# Patient Record
Sex: Female | Born: 1940 | Race: White | Hispanic: No | Marital: Single | Smoking: Former smoker
Health system: Southern US, Community
[De-identification: ages and names within clinical notes are randomized; demographics above are authoritative.]

## PROBLEM LIST (undated history)

## (undated) DIAGNOSIS — I10 Essential (primary) hypertension: Secondary | ICD-10-CM

## (undated) DIAGNOSIS — I714 Abdominal aortic aneurysm, without rupture, unspecified: Secondary | ICD-10-CM

## (undated) DIAGNOSIS — J449 Chronic obstructive pulmonary disease, unspecified: Secondary | ICD-10-CM

## (undated) DIAGNOSIS — M545 Low back pain, unspecified: Secondary | ICD-10-CM

## (undated) DIAGNOSIS — I71012 Dissection of descending thoracic aorta: Secondary | ICD-10-CM

## (undated) DIAGNOSIS — I739 Peripheral vascular disease, unspecified: Secondary | ICD-10-CM

## (undated) DIAGNOSIS — I712 Thoracic aortic aneurysm, without rupture, unspecified: Secondary | ICD-10-CM

## (undated) DIAGNOSIS — I5042 Chronic combined systolic (congestive) and diastolic (congestive) heart failure: Secondary | ICD-10-CM

## (undated) DIAGNOSIS — K219 Gastro-esophageal reflux disease without esophagitis: Secondary | ICD-10-CM

## (undated) DIAGNOSIS — R531 Weakness: Secondary | ICD-10-CM

## (undated) DIAGNOSIS — J9621 Acute and chronic respiratory failure with hypoxia: Secondary | ICD-10-CM

## (undated) DIAGNOSIS — G4700 Insomnia, unspecified: Secondary | ICD-10-CM

## (undated) DIAGNOSIS — E46 Unspecified protein-calorie malnutrition: Secondary | ICD-10-CM

## (undated) DIAGNOSIS — K5900 Constipation, unspecified: Secondary | ICD-10-CM

## (undated) DIAGNOSIS — D649 Anemia, unspecified: Secondary | ICD-10-CM

## (undated) DIAGNOSIS — J9690 Respiratory failure, unspecified, unspecified whether with hypoxia or hypercapnia: Secondary | ICD-10-CM

## (undated) DIAGNOSIS — F419 Anxiety disorder, unspecified: Secondary | ICD-10-CM

## (undated) DIAGNOSIS — G2581 Restless legs syndrome: Secondary | ICD-10-CM

## (undated) DIAGNOSIS — E785 Hyperlipidemia, unspecified: Secondary | ICD-10-CM

## (undated) DIAGNOSIS — F329 Major depressive disorder, single episode, unspecified: Secondary | ICD-10-CM

## (undated) DIAGNOSIS — I7100 Dissection of unspecified site of aorta: Secondary | ICD-10-CM

## (undated) HISTORY — DX: Hyperlipidemia, unspecified: E78.5

## (undated) HISTORY — DX: Chronic obstructive pulmonary disease, unspecified: J44.9

## (undated) HISTORY — DX: Low back pain: M54.5

## (undated) HISTORY — DX: Thoracic aortic aneurysm, without rupture, unspecified: I71.20

## (undated) HISTORY — DX: Restless legs syndrome: G25.81

## (undated) HISTORY — DX: Thoracic aortic aneurysm, without rupture: I71.2

## (undated) HISTORY — DX: Constipation, unspecified: K59.00

## (undated) HISTORY — DX: Insomnia, unspecified: G47.00

## (undated) HISTORY — DX: Unspecified protein-calorie malnutrition: E46

## (undated) HISTORY — DX: Essential (primary) hypertension: I10

## (undated) HISTORY — DX: Peripheral vascular disease, unspecified: I73.9

## (undated) HISTORY — DX: Acute and chronic respiratory failure with hypoxia: J96.21

## (undated) HISTORY — DX: Anxiety disorder, unspecified: F41.9

## (undated) HISTORY — DX: Respiratory failure, unspecified, unspecified whether with hypoxia or hypercapnia: J96.90

## (undated) HISTORY — DX: Gastro-esophageal reflux disease without esophagitis: K21.9

## (undated) HISTORY — DX: Major depressive disorder, single episode, unspecified: F32.9

## (undated) HISTORY — DX: Chronic combined systolic (congestive) and diastolic (congestive) heart failure: I50.42

## (undated) HISTORY — DX: Low back pain, unspecified: M54.50

## (undated) HISTORY — DX: Anemia, unspecified: D64.9

## (undated) HISTORY — PX: HERNIA REPAIR: SHX51

## (undated) HISTORY — DX: Weakness: R53.1

---

## 2016-08-30 HISTORY — PX: VIDEO ASSISTED THORACOSCOPY (VATS)/THOROCOTOMY: SHX6173

## 2016-08-30 HISTORY — PX: TRACHEOSTOMY: SUR1362

## 2016-09-06 HISTORY — PX: THORACIC AORTIC ANEURYSM REPAIR: SHX799

## 2016-10-06 ENCOUNTER — Other Ambulatory Visit (HOSPITAL_COMMUNITY): Payer: Medicare HMO

## 2016-10-06 ENCOUNTER — Institutional Professional Consult (permissible substitution) (HOSPITAL_COMMUNITY): Payer: Medicare HMO

## 2016-10-06 ENCOUNTER — Inpatient Hospital Stay
Admission: RE | Admit: 2016-10-06 | Discharge: 2016-11-04 | Disposition: A | Discharge status: Skilled Nursing Facility | Payer: Medicare HMO | Source: Ambulatory Visit | Attending: Internal Medicine | Admitting: Internal Medicine

## 2016-10-06 DIAGNOSIS — J9610 Chronic respiratory failure, unspecified whether with hypoxia or hypercapnia: Secondary | ICD-10-CM

## 2016-10-06 DIAGNOSIS — Z931 Gastrostomy status: Secondary | ICD-10-CM

## 2016-10-06 DIAGNOSIS — R52 Pain, unspecified: Secondary | ICD-10-CM

## 2016-10-06 DIAGNOSIS — J9690 Respiratory failure, unspecified, unspecified whether with hypoxia or hypercapnia: Secondary | ICD-10-CM

## 2016-10-06 MED ORDER — IOPAMIDOL (ISOVUE-300) INJECTION 61%
INTRAVENOUS | Status: AC
  Administered 2016-10-06: 50 mL
  Filled 2016-10-06: qty 50

## 2016-10-06 MED ORDER — IOPAMIDOL (ISOVUE-300) INJECTION 61%
50.0000 mL | Freq: Once | INTRAVENOUS | Status: AC | PRN
  Administered 2016-10-06: 50 mL

## 2016-10-07 LAB — CBC WITH DIFFERENTIAL/PLATELET
Basophils Absolute: 0 10*3/uL (ref 0.0–0.1)
Basophils Relative: 0 %
EOS ABS: 0.2 10*3/uL (ref 0.0–0.7)
EOS PCT: 3 %
HCT: 26.2 % — ABNORMAL LOW (ref 36.0–46.0)
Hemoglobin: 7.9 g/dL — ABNORMAL LOW (ref 12.0–15.0)
LYMPHS ABS: 0.9 10*3/uL (ref 0.7–4.0)
Lymphocytes Relative: 17 %
MCH: 30.5 pg (ref 26.0–34.0)
MCHC: 30.2 g/dL (ref 30.0–36.0)
MCV: 101.2 fL — ABNORMAL HIGH (ref 78.0–100.0)
MONO ABS: 0.5 10*3/uL (ref 0.1–1.0)
Monocytes Relative: 10 %
Neutro Abs: 3.8 10*3/uL (ref 1.7–7.7)
Neutrophils Relative %: 70 %
PLATELETS: 156 10*3/uL (ref 150–400)
RBC: 2.59 MIL/uL — AB (ref 3.87–5.11)
RDW: 19.5 % — AB (ref 11.5–15.5)
WBC: 5.5 10*3/uL (ref 4.0–10.5)

## 2016-10-07 LAB — BLOOD GAS, ARTERIAL
ACID-BASE EXCESS: 8.6 mmol/L — AB (ref 0.0–2.0)
BICARBONATE: 33.8 mmol/L — AB (ref 20.0–28.0)
FIO2: 45
LHR: 18 {breaths}/min
MECHVT: 400 mL
O2 Saturation: 98.5 %
PCO2 ART: 58.4 mmHg — AB (ref 32.0–48.0)
PEEP: 5 cmH2O
PH ART: 7.379 (ref 7.350–7.450)
Patient temperature: 98.1
pO2, Arterial: 106 mmHg (ref 83.0–108.0)

## 2016-10-07 LAB — COMPREHENSIVE METABOLIC PANEL
ALT: 11 U/L — AB (ref 14–54)
ANION GAP: 6 (ref 5–15)
AST: 17 U/L (ref 15–41)
Albumin: 2.3 g/dL — ABNORMAL LOW (ref 3.5–5.0)
Alkaline Phosphatase: 61 U/L (ref 38–126)
BUN: 8 mg/dL (ref 6–20)
CHLORIDE: 101 mmol/L (ref 101–111)
CO2: 32 mmol/L (ref 22–32)
CREATININE: 0.47 mg/dL (ref 0.44–1.00)
Calcium: 8.8 mg/dL — ABNORMAL LOW (ref 8.9–10.3)
Glucose, Bld: 88 mg/dL (ref 65–99)
POTASSIUM: 3.6 mmol/L (ref 3.5–5.1)
SODIUM: 139 mmol/L (ref 135–145)
Total Bilirubin: 0.4 mg/dL (ref 0.3–1.2)
Total Protein: 5.4 g/dL — ABNORMAL LOW (ref 6.5–8.1)

## 2016-10-07 LAB — PROTIME-INR
INR: 1.16
PROTHROMBIN TIME: 14.9 s (ref 11.4–15.2)

## 2016-10-08 LAB — URINALYSIS, ROUTINE W REFLEX MICROSCOPIC
BILIRUBIN URINE: NEGATIVE
Glucose, UA: NEGATIVE mg/dL
Ketones, ur: NEGATIVE mg/dL
Nitrite: NEGATIVE
PH: 7 (ref 5.0–8.0)
Protein, ur: NEGATIVE mg/dL
SPECIFIC GRAVITY, URINE: 1.009 (ref 1.005–1.030)

## 2016-10-10 LAB — URINE CULTURE

## 2016-10-13 LAB — CBC
HCT: 28.4 % — ABNORMAL LOW (ref 36.0–46.0)
HEMOGLOBIN: 9 g/dL — AB (ref 12.0–15.0)
MCH: 31 pg (ref 26.0–34.0)
MCHC: 31.7 g/dL (ref 30.0–36.0)
MCV: 97.9 fL (ref 78.0–100.0)
Platelets: 180 10*3/uL (ref 150–400)
RBC: 2.9 MIL/uL — ABNORMAL LOW (ref 3.87–5.11)
RDW: 18.7 % — ABNORMAL HIGH (ref 11.5–15.5)
WBC: 6.4 10*3/uL (ref 4.0–10.5)

## 2016-10-13 LAB — BASIC METABOLIC PANEL
Anion gap: 7 (ref 5–15)
BUN: 8 mg/dL (ref 6–20)
CHLORIDE: 97 mmol/L — AB (ref 101–111)
CO2: 33 mmol/L — ABNORMAL HIGH (ref 22–32)
CREATININE: 0.37 mg/dL — AB (ref 0.44–1.00)
Calcium: 9 mg/dL (ref 8.9–10.3)
GFR calc Af Amer: >60 mL/min (ref >60)
GFR calc non Af Amer: >60 mL/min (ref >60)
GLUCOSE: 101 mg/dL — AB (ref 65–99)
Potassium: 3.7 mmol/L (ref 3.5–5.1)
SODIUM: 137 mmol/L (ref 135–145)

## 2016-10-16 ENCOUNTER — Other Ambulatory Visit (HOSPITAL_COMMUNITY): Payer: Medicare HMO

## 2016-10-19 ENCOUNTER — Other Ambulatory Visit (HOSPITAL_COMMUNITY): Payer: Medicare HMO

## 2016-10-19 LAB — BASIC METABOLIC PANEL
Anion gap: 8 (ref 5–15)
BUN: 12 mg/dL (ref 6–20)
CALCIUM: 9.3 mg/dL (ref 8.9–10.3)
CHLORIDE: 100 mmol/L — AB (ref 101–111)
CO2: 30 mmol/L (ref 22–32)
CREATININE: 0.52 mg/dL (ref 0.44–1.00)
GFR calc Af Amer: >60 mL/min (ref >60)
GFR calc non Af Amer: >60 mL/min (ref >60)
Glucose, Bld: 85 mg/dL (ref 65–99)
Potassium: 4 mmol/L (ref 3.5–5.1)
SODIUM: 138 mmol/L (ref 135–145)

## 2016-10-19 LAB — CBC
HEMATOCRIT: 30.8 % — AB (ref 36.0–46.0)
HEMOGLOBIN: 9.3 g/dL — AB (ref 12.0–15.0)
MCH: 29.9 pg (ref 26.0–34.0)
MCHC: 30.2 g/dL (ref 30.0–36.0)
MCV: 99 fL (ref 78.0–100.0)
Platelets: 145 10*3/uL — ABNORMAL LOW (ref 150–400)
RBC: 3.11 MIL/uL — ABNORMAL LOW (ref 3.87–5.11)
RDW: 18 % — AB (ref 11.5–15.5)
WBC: 5.6 10*3/uL (ref 4.0–10.5)

## 2016-10-26 LAB — CBC
HCT: 29.3 % — ABNORMAL LOW (ref 36.0–46.0)
Hemoglobin: 8.9 g/dL — ABNORMAL LOW (ref 12.0–15.0)
MCH: 29.8 pg (ref 26.0–34.0)
MCHC: 30.4 g/dL (ref 30.0–36.0)
MCV: 98 fL (ref 78.0–100.0)
Platelets: 125 K/uL — ABNORMAL LOW (ref 150–400)
RBC: 2.99 MIL/uL — ABNORMAL LOW (ref 3.87–5.11)
RDW: 16.2 % — ABNORMAL HIGH (ref 11.5–15.5)
WBC: 4.7 K/uL (ref 4.0–10.5)

## 2016-10-26 LAB — BASIC METABOLIC PANEL
Anion gap: 8 (ref 5–15)
BUN: 7 mg/dL (ref 6–20)
CALCIUM: 9.7 mg/dL (ref 8.9–10.3)
CHLORIDE: 94 mmol/L — AB (ref 101–111)
CO2: 36 mmol/L — AB (ref 22–32)
Creatinine, Ser: 0.43 mg/dL — ABNORMAL LOW (ref 0.44–1.00)
GFR calc non Af Amer: >60 mL/min (ref >60)
GLUCOSE: 95 mg/dL (ref 65–99)
Potassium: 3.9 mmol/L (ref 3.5–5.1)
Sodium: 138 mmol/L (ref 135–145)

## 2016-11-01 ENCOUNTER — Other Ambulatory Visit (HOSPITAL_COMMUNITY): Payer: Medicare HMO

## 2016-11-13 ENCOUNTER — Ambulatory Visit: Payer: Medicare HMO | Admitting: Cardiology

## 2016-12-03 ENCOUNTER — Encounter: Payer: Self-pay | Admitting: Internal Medicine

## 2016-12-03 ENCOUNTER — Ambulatory Visit: Payer: Medicare HMO | Admitting: Internal Medicine

## 2016-12-03 ENCOUNTER — Ambulatory Visit (INDEPENDENT_AMBULATORY_CARE_PROVIDER_SITE_OTHER): Payer: Medicare HMO | Admitting: Internal Medicine

## 2016-12-03 VITALS — BP 142/68 | HR 88 | Ht 67.0 in | Wt 162.6 lb

## 2016-12-03 DIAGNOSIS — I509 Heart failure, unspecified: Secondary | ICD-10-CM | POA: Diagnosis not present

## 2016-12-03 DIAGNOSIS — I10 Essential (primary) hypertension: Secondary | ICD-10-CM | POA: Diagnosis not present

## 2016-12-03 DIAGNOSIS — I711 Thoracic aortic aneurysm, ruptured, unspecified: Secondary | ICD-10-CM

## 2016-12-03 NOTE — Patient Instructions (Addendum)
Medication Instructions:  Your physician recommends that you continue on your current medications as directed. Please refer to the Current Medication list given to you today.   Labwork: NONE ORDRED  Testing/Procedures: NONE ORDERED  Follow-Up: 01/15/17 @ 2:40 WITH DR. END  Any Other Special Instructions Will Be Listed Below (If Applicable).     If you need a refill on your cardiac medications before your next appointment, please call your pharmacy.

## 2016-12-03 NOTE — Progress Notes (Signed)
New Outpatient Visit Date: 12/03/2016  [facility]  Chief Complaint: Hospital follow-up  HPI:  [name] is a 76 y.o. female who is being seen today for the evaluation of possible CHF. The patient is unsure of why she is here today. She had a lengthy hospitalization in [place] that began with a ruptured thoracic aortic aneurysm in early April while she was visiting family. The patient has minimal recollection of her her entire hospitalization. Sounds like she presented to [facility] with lower abdominal pain and was found to have a ruptured TAA. He was transferred to [facility] [place] [facility] [place], where she underwent endovascular repair complicated by hemothorax with chest tube placement and subsequent VATS. She required multiple re-intubations and ultimately underwent tracheostomy and G-tube placement. Due to prolonged ventilator wean, [facility] [place] which [place] in [place]. [facility]. There is mention in [facility] of CHF, though specific not provided.  [name] denies any significant past medical history leading up to her hospitalization. She specifically denies any history of heart disease and has never seen a cardiologist before. She had not undergone previous cardiac testing to her knowledge. Medical records from her lengthy hospitalization in [place] are not available. I was able to speak with her daughter-in-law, Briana OharaBarbara Dean, who was able to provide further information on where [name] was hospitalized, though she to had limited information about the hospital course.  --------------------------------------------------------------------------------------------------  Cardiovascular History & Procedures: Cardiovascular  Problems:  TAA  ?CHF  Risk Factors:  Peripheral vascular disease, tobacco use, family history, and age greater than 4065  Cath/PCI:  None available  CV Surgery:  Endovascular TAA repair (08/2016, [place]  EP Procedures and Devices:  None available  Non-Invasive Evaluation(s):  None available  Recent CV Pertinent Labs: Lab Results  Component Value Date   INR 1.16 10/06/2016   K 3.9 10/26/2016   BUN 7 10/26/2016   CREATININE 0.43 (L) 10/26/2016    --------------------------------------------------------------------------------------------------  Past Medical History:  Diagnosis Date  . Acute on chronic respiratory failure with hypoxia (HCC)   . Anemia, unspecified   . Aneurysm of thoracic aorta (HCC)   . Anxiety disorder   . Chronic combined systolic and diastolic heart failure (HCC)   . Constipation   . Gastroesophageal reflux disease without esophagitis   . Hyperlipidemia, unspecified   . Hypertension, essential   . Insomnia, unspecified   . Low back pain   . Major depressive disorder, single episode, unspecified   . PAD (peripheral artery disease) (HCC)   . Respiratory failure (HCC)   . Restless leg syndrome   . Unspecified protein-calorie malnutrition (HCC)   . Weakness     No past surgical history on file.  No outpatient prescriptions have been marked as taking for the 12/03/16 encounter (Appointment) with Etoy Mcdonnell, Cristal Deerhristopher, MD.    Allergies: Aspirin and Penicillins  Social History   Social History  . Marital status: Single    Spouse name: N/A  . Number of children: N/A  . Years of education: N/A   Occupational History  . Not on file.   Social History Main Topics  . Smoking status: Not on file  . Smokeless tobacco: Not on file  . Alcohol use Not on file  . Drug use: Unknown  . Sexual activity: Not  on file   Other Topics Concern  . Not on file   Social History Narrative  . No narrative on file    No family history on  file.  Review of Systems: Patient notes pain with flexion of left hip when trying to get into [place] today. Otherwise, a 12-system review of systems was performed and was negative except as noted in the HPI.  --------------------------------------------------------------------------------------------------  Physical Exam: BP (!) 142/68   Pulse 88   Ht 5\' 7"  (1.702 m)   Wt 162 lb 9.6 oz (73.8 kg)   LMP  (LMP Unknown)   BMI 25.47 kg/m   General:  Frail, elderly woman, seated in wheelchair. She is [facility] HEENT: No conjunctival pallor or scleral icterus. Moist mucous membranes. OP clear. Nasal cannula in place for supplemental oxygen. Neck: Supple without lymphadenopathy, thyromegaly, JVD, or HJR. No carotid bruit. Lungs: Normal work of breathing. Diminished breath sounds throughout, most pronounced at the left base. No wheezes or crackles. Heart: Regular rate and rhythm without murmurs, rubs, or gallops. Non-displaced PMI. Abd: Bowel sounds present. Soft, NT/ND without hepatosplenomegaly Ext: No lower extremity edema. 2+ radial and 1+ pedal pulses bilaterally Skin: Warm and dry without rash. Neuro: Resting tremor present.  Psych: Normal mood and affect.   EKG:  Significant artifact from tremor, limiting evaluation.. NSR with incomplete RBBB and borderline LVH.  Lab Results  Component Value Date   WBC 4.7 10/26/2016   HGB 8.9 (L) 10/26/2016   HCT 29.3 (L) 10/26/2016   MCV 98.0 10/26/2016   PLT 125 (L) 10/26/2016    Lab Results  Component Value Date   NA 138 10/26/2016   K 3.9 10/26/2016   CL 94 (L) 10/26/2016   CO2 36 (H) 10/26/2016   BUN 7 10/26/2016   CREATININE 0.43 (L) 10/26/2016   GLUCOSE 95 10/26/2016   ALT 11 (L) 10/06/2016    No results found for: CHOL, HDL, LDLCALC, LDLDIRECT, TRIG, CHOLHDL   --------------------------------------------------------------------------------------------------  ASSESSMENT AND  PLAN: TAA Detail of hospitalization and intervention are unclear. No symptoms to suggest active complication. We have requested records from hospitals in [place] and [place], [place]. Patient will need long-term vascular surgery follow-up. I will defer making referral until records have been obtained.  CHF Report of heart failure noted in discharge summary from [facility], though no further details are provided (e.g. LVEF, acute vs chronic). She appears euvolemic today, though breath sounds are diminished. I suspect some of this is due to underlying hemothorax, VATS, and prolonged intubation. We will request records and then determine the need for medication adjustments and/or additional testing.  Hypertension BP mildly elevated today but low-normal at nursing home yesterday (122/48). I will defer medication changes today.  Follow-up: Return to clinic in 1 month.  Yvonne Kendall, MD 12/03/2016 3:04 PM

## 2016-12-04 ENCOUNTER — Encounter: Payer: Self-pay | Admitting: Internal Medicine

## 2016-12-04 DIAGNOSIS — I10 Essential (primary) hypertension: Secondary | ICD-10-CM | POA: Insufficient documentation

## 2016-12-04 DIAGNOSIS — I711 Thoracic aortic aneurysm, ruptured, unspecified: Secondary | ICD-10-CM | POA: Insufficient documentation

## 2016-12-04 DIAGNOSIS — I509 Heart failure, unspecified: Secondary | ICD-10-CM | POA: Insufficient documentation

## 2016-12-08 ENCOUNTER — Telehealth: Payer: Self-pay | Admitting: Internal Medicine

## 2016-12-08 NOTE — Telephone Encounter (Signed)
Records rec from [facility]. Placed in chart prep.

## 2016-12-08 NOTE — Telephone Encounter (Signed)
Records received from [facility]. Placed in Chart Prep.

## 2016-12-08 NOTE — Telephone Encounter (Signed)
ROI faxed to [facility]

## 2016-12-08 NOTE — Telephone Encounter (Signed)
ROI faxed to [facility]

## 2016-12-23 ENCOUNTER — Telehealth: Payer: Self-pay | Admitting: Internal Medicine

## 2016-12-23 NOTE — Telephone Encounter (Signed)
New message   rn wants skilled rn to see pt 2x week for 4 weeks   And then 1x week for 4 weeks and then PRN  Medication teaching and disease management teaching

## 2016-12-23 NOTE — Telephone Encounter (Signed)
I think that plan is reasonable. Her PCP should be the one to coordinate her ongoing home health needs. Thanks.  Yvonne Kendallhristopher Zera Markwardt, MD [facility] Pager: [phone]

## 2016-12-23 NOTE — Telephone Encounter (Signed)
Pt was D/C from Nursing home after hospitalization. Vernona RiegerLaura from skills nursing called to verify that Dr End okay for pt to continue with the plan of care at home: Skilled nurse to see pt 2 times a week for 4 weeks, then 1 time a week for 4 weeks and then PRN, medication teaching and disease management teaching. Pt was seen on 7/23 rd per skill nursing for the first time.

## 2016-12-23 NOTE — Telephone Encounter (Signed)
Vernona RiegerLaura skills nurse RN is aware that the skills plan is reasonable. Her PCP should be the one to coordinate her ongoing home health needs. RN verbalized understanding.

## 2016-12-28 ENCOUNTER — Telehealth: Payer: Self-pay | Admitting: Internal Medicine

## 2016-12-28 NOTE — Telephone Encounter (Signed)
I spoke with Briana Dean at [facility], she is aware that Dr End recommended that her PCP coordinate Home Health Care, see phone note 12/23/16.

## 2016-12-28 NOTE — Telephone Encounter (Signed)
Mall[facility] with [facility] calling, states that she needs a verbal order for patient.

## 2017-01-15 ENCOUNTER — Encounter (INDEPENDENT_AMBULATORY_CARE_PROVIDER_SITE_OTHER): Payer: Self-pay

## 2017-01-15 ENCOUNTER — Encounter: Payer: Self-pay | Admitting: Internal Medicine

## 2017-01-15 ENCOUNTER — Ambulatory Visit (INDEPENDENT_AMBULATORY_CARE_PROVIDER_SITE_OTHER): Payer: Medicare HMO | Admitting: Internal Medicine

## 2017-01-15 ENCOUNTER — Other Ambulatory Visit: Payer: Self-pay | Admitting: *Deleted

## 2017-01-15 VITALS — BP 170/90 | HR 70 | Ht 67.0 in | Wt 161.4 lb

## 2017-01-15 DIAGNOSIS — I10 Essential (primary) hypertension: Secondary | ICD-10-CM

## 2017-01-15 DIAGNOSIS — I711 Thoracic aortic aneurysm, ruptured, unspecified: Secondary | ICD-10-CM

## 2017-01-15 DIAGNOSIS — I509 Heart failure, unspecified: Secondary | ICD-10-CM

## 2017-01-15 MED ORDER — LISINOPRIL 10 MG PO TABS: 10.0000 mg | ORAL_TABLET | Freq: Every day | ORAL | 0 refills | Status: DC

## 2017-01-15 NOTE — Progress Notes (Addendum)
Follow-up Outpatient Visit Date: 01/15/2017  Primary Care Provider: Octavio Graves, [place] [id number]  Chief Complaint: Dizziness and elevated blood pressure  HPI:  Ms. Prazak is a 76 y.o. year-old female with history of ruptured thoracic aortic aneurysm status post emergent endovascular repair complicated by hemothorax and prolonged mechanical ventilatoin, who presents for follow-up. I first met her on 12/03/16 for evaluation of "CHF" though Ms. Peregrina did not express any symptoms of heart failure. No further information regarding a diagnosis for CHF was given. We deferred further testing and interventions at that time and requested records from her lengthy hospitalization in [place]. I have reviewed these records, which are notable for the aforementioned TAA repair. Echo report from early May indicates normal LV function with poor visualization of other cardiac structures.  Today, Ms. Markes reports that she has been doing well except for occasional lightheadedness and dizziness. She feels as though she is off balance. This is not clearly orthostatic. Her blood pressure also has been high over the last week, which she feels may be contributing to her dizziness. Her atenolol was increased from 12.5 to 25 mg recently by Dr. Melina Copa. Ms. Straus has left rehabilitation and is living with her daughter now. She continues with physical therapy, occupational therapy, speech therapy. She has not had any chest pain, orthopnea, PND, or edema. She has mild exertional dyspnea when walking from her house to the mailbox and back, which has a slight incline. She also notes occasional brief flutters in her chest without associated symptoms.  --------------------------------------------------------------------------------------------------  Cardiovascular History & Procedures: Cardiovascular Problems:  TAA  ? CHF  Risk Factors:  Peripheral vascular disease, tobacco use, family  history, and age greater than 11  Cath/PCI:  None available  CV Surgery:  Endovascular thoracic aortic aneurysm repair (09/06/2016, [place], [place]): 38 x 38 x 200 proximal Valiant endurant endograft covering the left subclavian artery, extension with a 38 x 38 x 150 Valiant endurant endograft distal component covering the mid/distal descending thoracic aorta at least 5 cm above the celiac trunk.  EP Procedures and Devices:  None available  Non-Invasive Evaluation(s):  TTE (10/03/16, [place], [place]): Normal LV size. LVEF 60-65% with normal wall motion. Valves not well seen. No pericardial effusion.  Recent CV Pertinent Labs: Lab Results  Component Value Date   INR 1.16 10/06/2016   K 3.9 10/26/2016   BUN 7 10/26/2016   CREATININE 0.43 (L) 10/26/2016    Past medical and surgical history were reviewed and updated in EPIC.  Current Meds  Medication Sig  . ALPRAZolam (XANAX) 0.25 MG tablet Take 0.25 mg by mouth every 8 (eight) hours as needed for anxiety.  Marland Kitchen aspirin EC 81 MG tablet Take 81 mg by mouth daily.  Marland Kitchen atenolol (TENORMIN) 12.5 mg TABS tablet Take 12.5 mg by mouth.  Marland Kitchen atorvastatin (LIPITOR) 40 MG tablet Take 40 mg by mouth daily.  Marland Kitchen enoxaparin (LOVENOX) 40 MG/0.4ML injection Inject 40 mg into the skin daily.  . famotidine (PEPCID) 20 MG tablet Take 20 mg by mouth daily.  . ferrous sulfate 324 (65 Fe) MG TBEC Take by mouth daily.  . furosemide (LASIX) 20 MG tablet Take 20 mg by mouth daily.  . Ipratropium-Albuterol (DUONEB IN) Inhale into the lungs 3 (three) times daily.  Marland Kitchen lidocaine (LIDODERM) 5 % Place 1 patch onto the skin daily. Remove & Discard patch within 12 hours or as directed by MD  . lisinopril (PRINIVIL,ZESTRIL) 2.5 MG tablet Take 2.5  mg by mouth daily.  Marland Kitchen MELATONIN ER PO Take 6 mg by mouth at bedtime.  . mirtazapine (REMERON) 7.5 MG tablet Take 7.5 mg by mouth at bedtime.  . Nutritional Supplements (RA MELATONIN/B-6 PO) Take 1 tablet by mouth at bedtime.  .  ondansetron (ZOFRAN) 4 MG tablet Take 4 mg by mouth every 6 (six) hours as needed for nausea or vomiting.  Marland Kitchen PARoxetine (PAXIL) 20 MG tablet Take 20 mg by mouth daily.  . polyethylene glycol (MIRALAX / GLYCOLAX) packet Take 17 g by mouth daily.  . potassium chloride SA (K-DUR,KLOR-CON) 20 MEQ tablet Take 20 mEq by mouth every 4 (four) hours as needed.  Marland Kitchen rOPINIRole (REQUIP) 0.5 MG tablet Take 0.5 mg by mouth at bedtime.     Allergies: Aspirin and Penicillins  Social History   Social History  . Marital status: Single    Spouse name: N/A  . Number of children: N/A  . Years of education: N/A   Occupational History  . Not on file.   Social History Main Topics  . Smoking status: Former Smoker    Packs/day: 1.00    Years: 60.00    Types: Cigarettes    Quit date: 08/30/2016  . Smokeless tobacco: Never Used  . Alcohol use No  . Drug use: No  . Sexual activity: Not on file   Other Topics Concern  . Not on file   Social History Narrative  . No narrative on file    Family History  Problem Relation Age of Onset  . Congenital heart disease Mother   . Peripheral vascular disease Father   . Pectus carinatum Son   . Heart failure Son   . Heart attack Son 55    Review of Systems: A 12-system review of systems was performed and was negative except as noted in the HPI.  --------------------------------------------------------------------------------------------------  Physical Exam: BP (!) 170/90   Pulse 70   Ht _0  (1.702 m)   Wt 161 lb 6.4 oz (73.2 kg)   LMP  (LMP Unknown)   SpO2 95%   BMI 25.28 kg/m   Repeat blood pressure:  Right arm: 186/78  Left arm: 128/56  General:  Well-developed, well-nourished woman, seated comfortably in the exam room. She is accompanied by her daughter. HEENT: No conjunctival pallor or scleral icterus. Moist mucous membranes.  OP clear. Neck: Supple without lymphadenopathy, thyromegaly, JVD, or HJR. Lungs: Normal work of breathing.  Clear to auscultation bilaterally without wheezes or crackles. Heart: Regular rate and rhythm without murmurs, rubs, or gallops. Non-displaced PMI. Abd: Bowel sounds present. Soft, NT/ND without hepatosplenomegaly Ext: No lower extremity edema. 1+ left radial pulse. 2+ right radial and bilateral pedal pulses.  Skin: Warm and dry without rash.   Lab Results  Component Value Date   WBC 4.7 10/26/2016   HGB 8.9 (L) 10/26/2016   HCT 29.3 (L) 10/26/2016   MCV 98.0 10/26/2016   PLT 125 (L) 10/26/2016    Lab Results  Component Value Date   NA 138 10/26/2016   K 3.9 10/26/2016   CL 94 (L) 10/26/2016   CO2 36 (H) 10/26/2016   BUN 7 10/26/2016   CREATININE 0.43 (L) 10/26/2016   GLUCOSE 95 10/26/2016   ALT 11 (L) 10/06/2016    No results found for: CHOL, HDL, LDLCALC, LDLDIRECT, TRIG, CHOLHDL  --------------------------------------------------------------------------------------------------  ASSESSMENT AND PLAN: Shortness of breath and history of heart failure Symptoms are quite minimal and could be related to deconditioning. However, the patient carries a  diagnosis of heart failure. The only echo results that I was able to find are from her hospitalization in [place] in May, at which time her LVEF was normal; there was no mention of diastolic function. She appears euvolemic and on exam today. We have agreed to repeat an echocardiogram to reevaluate for structural abnormalities. If this is normal, I think it is resolved to defer further cardiac workup.  Thoracic aortic aneurysm status post endovascular repair No symptoms at this time. Blood pressure is suboptimally controlled, which we will address, as below. I will refer Ms. Hollings to vascular surgery to establish ongoing follow-up of her endograft.  Hypertension Blood pressure is poorly controlled today. Of note, there is significant discrepancy between the right and left arms. In reviewing the operative note at the time of  endovascular TAA repair, the left subclavian artery was covered by the stent graft. This likely explains the differential blood pressures. I wonder if some of her dizziness may also reflect steal phenomenon. We have agreed to increase lisinopril to 10 mg daily and repeat a basic metabolic panel in about 2 weeks. I will defer additional medication changes to Dr. Melina Copa. Of note, blood pressure readings from the right arm should be used for long-term monitoring and medication titration.  Follow-up: Return to clinic in 3 months in the [place] office, as this is much closer to the patient's home.  Nelva Bush, MD 01/16/2017 5:24 PM

## 2017-01-15 NOTE — Patient Instructions (Signed)
Medication Instructions:  Increase lisinopril to 10 mg daily.  Labwork: Your physician recommends that you have lab in about 2 weeks--I have given you an order for lab that you can take with you. Please go to the Main Entrance of [facility]   Testing/Procedures: Your physician has requested that you have an echocardiogram. Echocardiography is a painless test that uses sound waves to create images of your heart. It provides your doctor with information about the size and shape of your heart and how well your heart's chambers and valves are working. This procedure takes approximately one hour. There are no restrictions for this procedure.  PLEASE SCHEDULE IN [place]--she will take lab order to have lab the same day.   Follow-Up: .You have been referred to Vascular Surgery for follow-up on your thoracic aortic aneurysm repair.  Your physician recommends that you schedule a follow-up appointment in:  3 months with a cardiologist in the [facility] or [facility] office.        If you need a refill on your cardiac medications before your next appointment, please call your pharmacy.

## 2017-01-16 ENCOUNTER — Encounter: Payer: Self-pay | Admitting: Internal Medicine

## 2017-01-29 ENCOUNTER — Ambulatory Visit (HOSPITAL_COMMUNITY)
Admission: RE | Admit: 2017-01-29 | Discharge: 2017-01-29 | Disposition: A | Discharge status: Home / Self Care | Payer: Medicare HMO | Source: Ambulatory Visit | Attending: Internal Medicine | Admitting: Internal Medicine

## 2017-01-29 ENCOUNTER — Telehealth: Payer: Self-pay | Admitting: Internal Medicine

## 2017-01-29 ENCOUNTER — Other Ambulatory Visit (HOSPITAL_COMMUNITY)
Admission: RE | Admit: 2017-01-29 | Discharge: 2017-01-29 | Disposition: A | Discharge status: Home / Self Care | Payer: Medicare HMO | Source: Ambulatory Visit | Attending: Internal Medicine | Admitting: Internal Medicine

## 2017-01-29 DIAGNOSIS — I711 Thoracic aortic aneurysm, ruptured, unspecified: Secondary | ICD-10-CM

## 2017-01-29 DIAGNOSIS — I509 Heart failure, unspecified: Secondary | ICD-10-CM | POA: Insufficient documentation

## 2017-01-29 DIAGNOSIS — I10 Essential (primary) hypertension: Secondary | ICD-10-CM | POA: Diagnosis not present

## 2017-01-29 DIAGNOSIS — I110 Hypertensive heart disease with heart failure: Secondary | ICD-10-CM | POA: Insufficient documentation

## 2017-01-29 LAB — BASIC METABOLIC PANEL
ANION GAP: 5 (ref 5–15)
BUN: 12 mg/dL (ref 6–20)
CALCIUM: 9.4 mg/dL (ref 8.9–10.3)
CO2: 32 mmol/L (ref 22–32)
CREATININE: 0.58 mg/dL (ref 0.44–1.00)
Chloride: 103 mmol/L (ref 101–111)
Glucose, Bld: 100 mg/dL — ABNORMAL HIGH (ref 65–99)
Potassium: 4.2 mmol/L (ref 3.5–5.1)
SODIUM: 140 mmol/L (ref 135–145)

## 2017-01-29 NOTE — Telephone Encounter (Signed)
[facility] echo tech calling to report increased BP w/ dizziness and swimmy headedness. Reports pt has not taken their morning medications nor eaten yet this morning. Advised pt to take their morning medications and get something to eat.  Advised that pt shouldn't drive with dizziness and echo tech informed me that pt's dtr will be driving. Pt will take medication/s and get something to eat and call office if this does not make improvement in symptoms.

## 2017-01-29 NOTE — Progress Notes (Signed)
*  PRELIMINARY RESULTS* Echocardiogram 2D Echocardiogram has been performed.  Briana Dean, Nuno Brubacher 01/29/2017, 11:25 AM

## 2017-01-29 NOTE — Telephone Encounter (Signed)
New Patient     Pt c/o BP issue: STAT if pt c/o blurred vision, one-sided weakness or slurred speech  1. What are your last 5 BP readings?187/94   2. Are you having any other symptoms (ex. Dizziness, headache, blurred vision, passed out)? Swimmy headed and dizzy   3. What is your BP issue?  Has not took meds or eaten, is there something they should do , should they let her drive?

## 2017-02-17 ENCOUNTER — Ambulatory Visit (INDEPENDENT_AMBULATORY_CARE_PROVIDER_SITE_OTHER): Payer: Medicare HMO | Admitting: Vascular Surgery

## 2017-02-17 ENCOUNTER — Encounter: Payer: Self-pay | Admitting: Vascular Surgery

## 2017-02-17 VITALS — BP 131/99 | HR 81 | Temp 97.5°F | Resp 18 | Ht 67.0 in | Wt 162.0 lb

## 2017-02-17 DIAGNOSIS — I716 Thoracoabdominal aortic aneurysm, without rupture, unspecified: Secondary | ICD-10-CM

## 2017-02-17 NOTE — Progress Notes (Signed)
[facility] [place]  Patient name: Briana Dean MRN: [id number] DOB: 1941-04-08 Sex: female  REASON FOR CONSULT: Establish follow-up for treatment of ruptured thoracoabdominal aneurysm  HPI: Briana Dean is a 76 y.o. female, who is seen today to establish follow-up. She is here today with her daughter. She was living in [place] when she had the sudden onset of pain and then does not recall anything else for several months until she was in [facility] long-term acute care. She had a protracted hospital course and [place]. Eventually had recovery and was discharged home. Did have the tracheostomy with ventilator dependency and now is recovered from this as well. She did have stent graft repair with covering of her left subclavian artery and extension down to 5 cm above the celiac artery by history. I do not have her operative records from this event. I do have a chest x-ray in our system from May 2018 showing no evidence of mediastinal hematoma and the stent graft in her thoracic aorta. She has not made full recovery. She is somewhat unsteady on her feet and has fatigue and memory issues but is continuing to make recovery.  Past Medical History:  Diagnosis Date  . Acute on chronic respiratory failure with hypoxia (HCC)   . Anemia, unspecified   . Aneurysm of thoracic aorta (HCC)   . Anxiety disorder   . Chronic combined systolic and diastolic heart failure (HCC)   . Constipation   . COPD (chronic obstructive pulmonary disease) (HCC)   . Gastroesophageal reflux disease without esophagitis   . Hyperlipidemia, unspecified   . Hypertension, essential   . Insomnia, unspecified   . Low back pain   . Major depressive disorder, single episode, unspecified   . PAD (peripheral artery disease) (HCC)   . Respiratory failure (HCC)   . Restless leg syndrome   . Unspecified protein-calorie malnutrition (HCC)   . Weakness      Family History  Problem Relation Age of Onset  . Congenital heart disease Mother   . Heart disease Mother   . Peripheral vascular disease Father   . Pectus carinatum Son   . Heart failure Son   . Heart attack Son 91    SOCIAL HISTORY: Social History   Social History  . Marital status: Single    Spouse name: N/A  . Number of children: N/A  . Years of education: N/A   Occupational History  . Not on file.   Social History Main Topics  . Smoking status: Former Smoker    Packs/day: 1.00    Years: 60.00    Types: Cigarettes    Quit date: 08/30/2016  . Smokeless tobacco: Never Used  . Alcohol use No  . Drug use: No  . Sexual activity: Not on file   Other Topics Concern  . Not on file   Social History Narrative  . No narrative on file    Allergies  Allergen Reactions  . Aspirin   . Penicillins     Current Outpatient Prescriptions  Medication Sig Dispense Refill  . ALPRAZolam (XANAX) 0.25 MG tablet Take 0.25 mg by mouth every 8 (eight) hours as needed for anxiety.    Marland Kitchen aspirin EC 81 MG tablet Take 81 mg by mouth daily.    Marland Kitchen atenolol (TENORMIN) 12.5 mg TABS tablet Take 25 mg by mouth.     Marland Kitchen atorvastatin (LIPITOR) 40 MG tablet Take 40 mg by mouth daily.    Marland Kitchen  famotidine (PEPCID) 20 MG tablet Take 20 mg by mouth daily.    . ferrous sulfate 324 (65 Fe) MG TBEC Take by mouth daily.    . furosemide (LASIX) 20 MG tablet Take 20 mg by mouth daily.    . Ipratropium-Albuterol (DUONEB IN) Inhale into the lungs 3 (three) times daily.    Marland Kitchen lisinopril (PRINIVIL,ZESTRIL) 10 MG tablet Take 1 tablet (10 mg total) by mouth daily. 90 tablet 0  . MELATONIN ER PO Take 6 mg by mouth at bedtime.    . mirtazapine (REMERON) 7.5 MG tablet Take 15 mg by mouth at bedtime.     . Nutritional Supplements (RA MELATONIN/B-6 PO) Take 1 tablet by mouth at bedtime.    . ondansetron (ZOFRAN) 4 MG tablet Take 4 mg by mouth every 6 (six) hours as needed for nausea or vomiting.    Marland Kitchen PARoxetine (PAXIL)  20 MG tablet Take 20 mg by mouth daily.    . polyethylene glycol (MIRALAX / GLYCOLAX) packet Take 17 g by mouth daily as needed for mild constipation.     . potassium chloride SA (K-DUR,KLOR-CON) 20 MEQ tablet Take 20 mEq by mouth every 4 (four) hours as needed.    Marland Kitchen rOPINIRole (REQUIP) 0.5 MG tablet Take 0.5 mg by mouth at bedtime.    . lidocaine (LIDODERM) 5 % Place 1 patch onto the skin daily. Remove & Discard patch within 12 hours or as directed by MD     No current facility-administered medications for this visit.     REVIEW OF SYSTEMS:   denotes positive finding,  denotes negative finding Cardiac  Comments:  Chest pain or chest pressure:    Shortness of breath upon exertion: x   Short of breath when lying flat:    Irregular heart rhythm:        Vascular    Pain in calf, thigh, or hip brought on by ambulation: x (Arthritis   Pain in feet at night that wakes you up from your sleep:     Blood clot in your veins:    Leg swelling:         Pulmonary    Oxygen at home:    Productive cough:     Wheezing:         Neurologic    Sudden weakness in arms or legs:     Sudden numbness in arms or legs:     Sudden onset of difficulty speaking or slurred speech:    Temporary loss of vision in one eye:     Problems with dizziness:  x       Gastrointestinal    Blood in stool:     Vomited blood:         Genitourinary    Burning when urinating:     Blood in urine:        Psychiatric    Major depression:         Hematologic    Bleeding problems:    Problems with blood clotting too easily:        Skin    Rashes or ulcers:        Constitutional    Fever or chills:      PHYSICAL EXAM: Vitals:   02/17/17 1002 02/17/17 1005  BP: (!) 187/99 (!) 131/99  Pulse: 81   Resp: 18   Temp: (!) 97.5 F (36.4 C)   TempSrc: Oral   SpO2: 97%   Weight: 162 lb (73.5 kg)  Height:  (1.702 m)     GENERAL: The patient is a well-nourished female, in no acute distress. The  vital signs are documented above. CARDIOVASCULAR: Palpable radial pulses bilaterally. Possibly less prominent on the left than the right. 2+ femoral pulses and 2+ dorsalis pedis pulses bilaterally. Carotid arteries without bruits PULMONARY: There is good air exchange  ABDOMEN: Soft and non-tender no evidence of abdominal aneurysm. Well-healed large midline incision MUSCULOSKELETAL: There are no major deformities or cyanosis. NEUROLOGIC: No focal weakness or paresthesias are detected. SKIN: There are no ulcers or rashes noted. PSYCHIATRIC: The patient has a normal affect.  DATA:  Chest x-ray reviewed from 10/19/2016.  MEDICAL ISSUES: I discussed the follow-up of her ruptured thoracic aneurysm with the patient and her daughter present. Have recommended CT scan for baseline of her chest abdomen and pelvis. We will obtain this at [facility] for her convenience. We will notify her following the results of these. Assuming this looks okay we will then see her in one year with CT of her chest only.   Larina Earthly, MD FACS [facility] Tel [phone] Pager [phone]

## 2017-03-03 ENCOUNTER — Other Ambulatory Visit: Payer: Self-pay

## 2017-03-03 DIAGNOSIS — I711 Thoracic aortic aneurysm, ruptured, unspecified: Secondary | ICD-10-CM

## 2017-03-03 DIAGNOSIS — I716 Thoracoabdominal aortic aneurysm, without rupture, unspecified: Secondary | ICD-10-CM

## 2017-03-04 ENCOUNTER — Other Ambulatory Visit: Payer: Self-pay

## 2017-03-04 ENCOUNTER — Telehealth: Payer: Self-pay | Admitting: Vascular Surgery

## 2017-03-04 DIAGNOSIS — I711 Thoracic aortic aneurysm, ruptured, unspecified: Secondary | ICD-10-CM

## 2017-03-04 DIAGNOSIS — Z01812 Encounter for preprocedural laboratory examination: Secondary | ICD-10-CM

## 2017-03-04 NOTE — Telephone Encounter (Signed)
Sched CTA 03/15/17 at 8:00 at [facility]. Lm on Barbara's ph# as per requested.

## 2017-03-15 ENCOUNTER — Ambulatory Visit (HOSPITAL_COMMUNITY)
Admission: RE | Admit: 2017-03-15 | Discharge: 2017-03-15 | Disposition: A | Discharge status: Home / Self Care | Payer: Medicare HMO | Source: Ambulatory Visit | Attending: Vascular Surgery | Admitting: Vascular Surgery

## 2017-03-15 DIAGNOSIS — I711 Thoracic aortic aneurysm, ruptured, unspecified: Secondary | ICD-10-CM

## 2017-03-15 DIAGNOSIS — I716 Thoracoabdominal aortic aneurysm, without rupture, unspecified: Secondary | ICD-10-CM

## 2017-03-15 DIAGNOSIS — I700 Atherosclerosis of aorta: Secondary | ICD-10-CM | POA: Insufficient documentation

## 2017-03-15 LAB — POCT I-STAT CREATININE: CREATININE: 0.7 mg/dL (ref 0.44–1.00)

## 2017-03-15 MED ORDER — IOPAMIDOL (ISOVUE-370) INJECTION 76%
100.0000 mL | Freq: Once | INTRAVENOUS | Status: AC | PRN
  Administered 2017-03-15: 100 mL via INTRAVENOUS

## 2017-04-07 ENCOUNTER — Telehealth: Payer: Self-pay | Admitting: Internal Medicine

## 2017-04-07 NOTE — Telephone Encounter (Signed)
New Message  Amy call requesting speak with RN abot getting orders signed from plan of care 485. Amy states it was faxed over on 8/7 and 10/25. Amy states the information can be fax back to her at [phone]. Please call back to discuss if needed

## 2017-04-07 NOTE — Telephone Encounter (Signed)
Left VM for Briana Dean letting her know unable to locate plan of care form.  Advised on message to fax over again.  Left fax number and advised to call back if any questions.  Advised I will send message to Dr. Serita KyleEnd's nurse to keep an eye out for paperwork.

## 2017-04-09 NOTE — Telephone Encounter (Signed)
I spoke with Amy today, she  is aware that Dr End has recommended PCP coordinate home health needs for this patient and that there are 2 phone notes dated 12/23/16 and 12/28/16 that this was communicated to their staff. She thanked me for the call.

## 2017-04-16 ENCOUNTER — Other Ambulatory Visit: Payer: Self-pay | Admitting: Internal Medicine

## 2017-04-16 DIAGNOSIS — I509 Heart failure, unspecified: Secondary | ICD-10-CM

## 2017-04-16 DIAGNOSIS — I711 Thoracic aortic aneurysm, ruptured, unspecified: Secondary | ICD-10-CM

## 2017-04-16 DIAGNOSIS — I10 Essential (primary) hypertension: Secondary | ICD-10-CM

## 2017-04-16 NOTE — Telephone Encounter (Signed)
Refill Request.  

## 2017-04-26 ENCOUNTER — Ambulatory Visit: Payer: Medicare HMO | Admitting: Internal Medicine

## 2017-04-28 ENCOUNTER — Ambulatory Visit (INDEPENDENT_AMBULATORY_CARE_PROVIDER_SITE_OTHER): Payer: Medicare HMO | Admitting: Cardiovascular Disease

## 2017-04-28 ENCOUNTER — Encounter: Payer: Self-pay | Admitting: Cardiovascular Disease

## 2017-04-28 VITALS — BP 150/98 | HR 61 | Ht 67.0 in | Wt 172.0 lb

## 2017-04-28 DIAGNOSIS — I711 Thoracic aortic aneurysm, ruptured, unspecified: Secondary | ICD-10-CM

## 2017-04-28 DIAGNOSIS — I10 Essential (primary) hypertension: Secondary | ICD-10-CM | POA: Diagnosis not present

## 2017-04-28 DIAGNOSIS — I714 Abdominal aortic aneurysm, without rupture, unspecified: Secondary | ICD-10-CM

## 2017-04-28 NOTE — Progress Notes (Signed)
SUBJECTIVE: The patient presents to establish care in our [place] office.  She was last seen by Dr. Okey Dupre on 01/15/17.  She has a history of ruptured thoracic aortic aneurysm status post emergent endovascular repair complicated by hemothorax and prolonged mechanical ventilation.  Echocardiogram 01/29/17: Normal left ventricular systolic function, LVEF 55-60%, mild LVH, grade 1 diastolic dysfunction, trivial aortic regurgitation.   CV Surgery:  Endovascular thoracic aortic aneurysm repair (09/06/2016, [place], [place]): 38 x 38 x 200 proximal Valiant endurant endograft covering the left subclavian artery, extension with a 38 x 38 x 150 Valiant endurant endograft distal component covering the mid/distal descending thoracic aorta at least 5 cm above the celiac trunk.   Non-Invasive Evaluation(s):  TTE (10/03/16, [place], [place]): Normal LV size. LVEF 60-65% with normal wall motion. Valves not well seen. No pericardial effusion.  She is doing well today.  She denies chest pain.  She has some chest congestion but this has been improving and her breathing has become easier.  I reviewed the CT angiogram of the chest, abdomen, and pelvis performed on 03/15/17, ordered by Dr. Arbie Cookey.  The endovascular stent graft was widely patent without evidence of thoracic aortic dissection.  There is a 4.2 cm distal descending thoracic aortic aneurysm.  There was a 3.4 cm proximal abdominal aortic aneurysm with moderate sized dissection involving the distal infrarenal abdominal aorta.       Review of Systems: As per "subjective", otherwise negative.  Allergies  Allergen Reactions  . Aspirin   . Penicillins     Current Outpatient Medications  Medication Sig Dispense Refill  . ALPRAZolam (XANAX) 0.25 MG tablet Take 0.25 mg by mouth every 8 (eight) hours as needed for anxiety.    Marland Kitchen aspirin EC 81 MG tablet Take 81 mg by mouth daily.    Marland Kitchen atenolol (TENORMIN) 12.5 mg TABS tablet Take 25 mg by mouth.     Marland Kitchen  atorvastatin (LIPITOR) 40 MG tablet Take 40 mg by mouth daily.    . carbidopa-levodopa (SINEMET IR) 25-100 MG tablet Take 1 tablet by mouth daily.    . ergocalciferol (VITAMIN D2) 50000 units capsule Take 50,000 Units by mouth once a week.    . famotidine (PEPCID) 20 MG tablet Take 20 mg by mouth daily.    . ferrous sulfate 324 (65 Fe) MG TBEC Take by mouth daily.    . furosemide (LASIX) 20 MG tablet Take 20 mg by mouth daily.    . Ipratropium-Albuterol (DUONEB IN) Inhale into the lungs 3 (three) times daily.    Marland Kitchen lidocaine (LIDODERM) 5 % Place 1 patch onto the skin daily. Remove & Discard patch within 12 hours or as directed by MD    . lisinopril (PRINIVIL,ZESTRIL) 10 MG tablet TAKE 1 TABLET BY MOUTH ONCE DAILY 30 tablet 0  . MELATONIN ER PO Take 6 mg by mouth at bedtime.    . mirtazapine (REMERON) 15 MG tablet Take 15 mg by mouth at bedtime.    . Nutritional Supplements (RA MELATONIN/B-6 PO) Take 1 tablet by mouth at bedtime.    . ondansetron (ZOFRAN) 4 MG tablet Take 4 mg by mouth every 6 (six) hours as needed for nausea or vomiting.    Marland Kitchen PARoxetine (PAXIL) 20 MG tablet Take 20 mg by mouth daily.    . polyethylene glycol (MIRALAX / GLYCOLAX) packet Take 17 g by mouth daily as needed for mild constipation.     Marland Kitchen rOPINIRole (REQUIP) 0.5 MG tablet Take 0.5 mg by  mouth at bedtime. 2 tabs daily    . traMADol (ULTRAM) 50 MG tablet Take by mouth every 6 (six) hours as needed.     No current facility-administered medications for this visit.     Past Medical History:  Diagnosis Date  . Acute on chronic respiratory failure with hypoxia (HCC)   . Anemia, unspecified   . Aneurysm of thoracic aorta (HCC)   . Anxiety disorder   . Chronic combined systolic and diastolic heart failure (HCC)   . Constipation   . COPD (chronic obstructive pulmonary disease) (HCC)   . Gastroesophageal reflux disease without esophagitis   . Hyperlipidemia, unspecified   . Hypertension, essential   . Insomnia,  unspecified   . Low back pain   . Major depressive disorder, single episode, unspecified   . PAD (peripheral artery disease) (HCC)   . Respiratory failure (HCC)   . Restless leg syndrome   . Unspecified protein-calorie malnutrition (HCC)   . Weakness     Past Surgical History:  Procedure Laterality Date  . THORACIC AORTIC ANEURYSM REPAIR  09/06/2016   TEVAR  . TRACHEOSTOMY  08/2016  . VIDEO ASSISTED THORACOSCOPY (VATS)/THOROCOTOMY  08/2016    Social History   Socioeconomic History  . Marital status: Single    Spouse name: Not on file  . Number of children: Not on file  . Years of education: Not on file  . Highest education level: Not on file  Social Needs  . Financial resource strain: Not on file  . Food insecurity - worry: Not on file  . Food insecurity - inability: Not on file  . Transportation needs - medical: Not on file  . Transportation needs - non-medical: Not on file  Occupational History  . Not on file  Tobacco Use  . Smoking status: Former Smoker    Packs/day: 1.00    Years: 60.00    Pack years: 60.00    Types: Cigarettes    Last attempt to quit: 08/30/2016    Years since quitting: 0.6  . Smokeless tobacco: Never Used  Substance and Sexual Activity  . Alcohol use: No  . Drug use: No  . Sexual activity: Not on file  Other Topics Concern  . Not on file  Social History Narrative  . Not on file     Vitals:   04/28/17 0936  BP: (!) 150/98  Pulse: 61  SpO2: 95%  Weight: 172 lb (78 kg)  Height: 5\' 7"  (1.702 m)    Wt Readings from Last 3 Encounters:  04/28/17 172 lb (78 kg)  02/17/17 162 lb (73.5 kg)  01/15/17 161 lb 6.4 oz (73.2 kg)     PHYSICAL EXAM General: NAD HEENT: Normal. Neck: No JVD, no thyromegaly. Lungs: Diminished throughout, no crackles or wheezes. CV: Regular rate and rhythm, normal S1/S2, no S3/S4, no murmur. No pretibial or periankle edema. Abdomen: Soft, nontender, no distention.  Neurologic: Alert and oriented.  Psych:  Normal affect. Skin: Normal. Musculoskeletal: No gross deformities.    ECG: Most recent ECG reviewed.   Labs: Lab Results  Component Value Date/Time   K 4.2 01/29/2017 11:22 AM   BUN 12 01/29/2017 11:22 AM   CREATININE 0.70 03/15/2017 08:16 AM   ALT 11 (L) 10/06/2016 11:53 PM   HGB 8.9 (L) 10/26/2016 06:06 AM     Lipids: No results found for: LDLCALC, LDLDIRECT, CHOL, TRIG, HDL     ASSESSMENT AND PLAN: 1.  Thoracic aortic aneurysm status post endovascular repair: Blood pressure is  elevated which I will aim to control.  She should follow-up with vascular surgery for ongoing follow-up of her endograft.  Most recent CT from October 2018 reviewed above.  2.  Hypertension: Blood pressure is poorly controlled today.  I will increase lisinopril to 20 mg daily.  There is significant discrepancy between the right and left arms and it appears the left subclavian artery was covered by the stent graft.  This would explain the differential and blood pressures.  For future reference, blood pressure readings from the right arm should be used for long-term monitoring and medication titration.  3.  Abdominal aortic aneurysm: She will follow-up with vascular surgery.  I will make sure she has an appointment with Dr. Arbie CookeyEarly.   Disposition: Follow up with me as needed   Prentice DockerSuresh Pavielle Biggar, M.D., F.A.C.C.

## 2017-04-28 NOTE — Patient Instructions (Signed)
Medication Instructions:  Your physician recommends that you continue on your current medications as directed. Please refer to the Current Medication list given to you today.  Labwork: NONE  Testing/Procedures: NONE  Follow-Up: Your physician recommends that you schedule a follow-up appointment AS NEEDED  Any Other Special Instructions Will Be Listed Below (If Applicable).  If you need a refill on your cardiac medications before your next appointment, please call your pharmacy. 

## 2017-04-28 NOTE — Addendum Note (Signed)
Addended by: Norva PavlovJOYCE, Rameen Quinney on: 04/28/2017 11:07 AM   Modules accepted: Orders

## 2017-05-17 ENCOUNTER — Other Ambulatory Visit: Payer: Self-pay | Admitting: Internal Medicine

## 2017-05-17 DIAGNOSIS — I711 Thoracic aortic aneurysm, ruptured, unspecified: Secondary | ICD-10-CM

## 2017-05-17 DIAGNOSIS — I509 Heart failure, unspecified: Secondary | ICD-10-CM

## 2017-05-17 DIAGNOSIS — I10 Essential (primary) hypertension: Secondary | ICD-10-CM

## 2017-05-17 NOTE — Telephone Encounter (Signed)
Please review for refill. Thanks!  

## 2017-07-22 ENCOUNTER — Emergency Department (HOSPITAL_COMMUNITY)
Admission: EM | Admit: 2017-07-22 | Discharge: 2017-07-22 | Disposition: A | Discharge status: Home / Self Care | Payer: Medicare HMO | Attending: Emergency Medicine | Admitting: Emergency Medicine

## 2017-07-22 ENCOUNTER — Emergency Department (HOSPITAL_COMMUNITY): Payer: Medicare HMO

## 2017-07-22 ENCOUNTER — Encounter (HOSPITAL_COMMUNITY): Payer: Self-pay | Admitting: Emergency Medicine

## 2017-07-22 DIAGNOSIS — Z7982 Long term (current) use of aspirin: Secondary | ICD-10-CM | POA: Diagnosis not present

## 2017-07-22 DIAGNOSIS — I5042 Chronic combined systolic (congestive) and diastolic (congestive) heart failure: Secondary | ICD-10-CM | POA: Insufficient documentation

## 2017-07-22 DIAGNOSIS — Z87891 Personal history of nicotine dependence: Secondary | ICD-10-CM | POA: Insufficient documentation

## 2017-07-22 DIAGNOSIS — I739 Peripheral vascular disease, unspecified: Secondary | ICD-10-CM | POA: Insufficient documentation

## 2017-07-22 DIAGNOSIS — I110 Hypertensive heart disease with heart failure: Secondary | ICD-10-CM | POA: Insufficient documentation

## 2017-07-22 DIAGNOSIS — I10 Essential (primary) hypertension: Secondary | ICD-10-CM | POA: Diagnosis not present

## 2017-07-22 DIAGNOSIS — Z79899 Other long term (current) drug therapy: Secondary | ICD-10-CM | POA: Insufficient documentation

## 2017-07-22 DIAGNOSIS — R0789 Other chest pain: Secondary | ICD-10-CM | POA: Insufficient documentation

## 2017-07-22 LAB — CBC WITH DIFFERENTIAL/PLATELET
Basophils Absolute: 0 10*3/uL (ref 0.0–0.1)
Basophils Relative: 0 %
Eosinophils Absolute: 0.1 10*3/uL (ref 0.0–0.7)
Eosinophils Relative: 4 %
HCT: 40.8 % (ref 36.0–46.0)
HEMOGLOBIN: 13.5 g/dL (ref 12.0–15.0)
LYMPHS ABS: 0.7 10*3/uL (ref 0.7–4.0)
LYMPHS PCT: 20 %
MCH: 30.7 pg (ref 26.0–34.0)
MCHC: 33.1 g/dL (ref 30.0–36.0)
MCV: 92.7 fL (ref 78.0–100.0)
Monocytes Absolute: 0.3 10*3/uL (ref 0.1–1.0)
Monocytes Relative: 9 %
NEUTROS ABS: 2.4 10*3/uL (ref 1.7–7.7)
NEUTROS PCT: 67 %
Platelets: 132 10*3/uL — ABNORMAL LOW (ref 150–400)
RBC: 4.4 MIL/uL (ref 3.87–5.11)
RDW: 13.8 % (ref 11.5–15.5)
WBC: 3.6 10*3/uL — AB (ref 4.0–10.5)

## 2017-07-22 LAB — HEPATIC FUNCTION PANEL
ALK PHOS: 67 U/L (ref 38–126)
ALT: 6 U/L — AB (ref 14–54)
AST: 22 U/L (ref 15–41)
Albumin: 4.2 g/dL (ref 3.5–5.0)
BILIRUBIN DIRECT: 0.1 mg/dL (ref 0.1–0.5)
BILIRUBIN INDIRECT: 1 mg/dL — AB (ref 0.3–0.9)
TOTAL PROTEIN: 7.8 g/dL (ref 6.5–8.1)
Total Bilirubin: 1.1 mg/dL (ref 0.3–1.2)

## 2017-07-22 LAB — BASIC METABOLIC PANEL
Anion gap: 13 (ref 5–15)
BUN: 14 mg/dL (ref 6–20)
CHLORIDE: 101 mmol/L (ref 101–111)
CO2: 28 mmol/L (ref 22–32)
Calcium: 9.4 mg/dL (ref 8.9–10.3)
Creatinine, Ser: 0.69 mg/dL (ref 0.44–1.00)
GFR calc Af Amer: >60 mL/min (ref >60)
GFR calc non Af Amer: >60 mL/min (ref >60)
GLUCOSE: 166 mg/dL — AB (ref 65–99)
POTASSIUM: 3.5 mmol/L (ref 3.5–5.1)
Sodium: 142 mmol/L (ref 135–145)

## 2017-07-22 LAB — TROPONIN I: Troponin I: <0.03 ng/mL (ref <0.03)

## 2017-07-22 LAB — BRAIN NATRIURETIC PEPTIDE: B Natriuretic Peptide: 96 pg/mL (ref 0.0–100.0)

## 2017-07-22 MED ORDER — ATENOLOL 25 MG PO TABS
25.0000 mg | ORAL_TABLET | Freq: Once | ORAL | Status: AC
  Administered 2017-07-22: 25 mg via ORAL
  Filled 2017-07-22: qty 1

## 2017-07-22 MED ORDER — IOPAMIDOL (ISOVUE-370) INJECTION 76%
100.0000 mL | Freq: Once | INTRAVENOUS | Status: AC | PRN
  Administered 2017-07-22: 100 mL via INTRAVENOUS

## 2017-07-22 MED ORDER — HYDRALAZINE HCL 20 MG/ML IJ SOLN
5.0000 mg | Freq: Once | INTRAMUSCULAR | Status: AC
  Administered 2017-07-22: 5 mg via INTRAVENOUS
  Filled 2017-07-22: qty 1

## 2017-07-22 NOTE — ED Provider Notes (Signed)
[facility] EMERGENCY DEPARTMENT Provider Note   CSN: [id number] Arrival date & time: 07/22/17  1416     History   Chief Complaint Chief Complaint  Patient presents with  . Hypertension    HPI Briana Dean is a 77 y.o. female.  Patient complains of some chest discomfort and also of her blood pressure being very elevated.   The history is provided by the patient.  Hypertension  This is a recurrent problem. The current episode started more than 2 days ago. The problem occurs constantly. The problem has not changed since onset.Associated symptoms include chest pain. Pertinent negatives include no abdominal pain and no headaches. Nothing aggravates the symptoms. Nothing relieves the symptoms. She has tried nothing for the symptoms. The treatment provided no relief.    Past Medical History:  Diagnosis Date  . Acute on chronic respiratory failure with hypoxia (HCC)   . Anemia, unspecified   . Aneurysm of thoracic aorta (HCC)   . Anxiety disorder   . Chronic combined systolic and diastolic heart failure (HCC)   . Constipation   . COPD (chronic obstructive pulmonary disease) (HCC)   . Gastroesophageal reflux disease without esophagitis   . Hyperlipidemia, unspecified   . Hypertension, essential   . Insomnia, unspecified   . Low back pain   . Major depressive disorder, single episode, unspecified   . PAD (peripheral artery disease) (HCC)   . Respiratory failure (HCC)   . Restless leg syndrome   . Unspecified protein-calorie malnutrition (HCC)   . Weakness     Patient Active Problem List   Diagnosis Date Noted  . Congestive heart failure (HCC) 12/04/2016  . 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 Essential hypertension 12/04/2016    Past Surgical History:  Procedure Laterality Date  . THORACIC AORTIC ANEURYSM REPAIR  09/06/2016   TEVAR  . TRACHEOSTOMY  08/2016  . VIDEO ASSISTED THORACOSCOPY (VATS)/THOROCOTOMY  08/2016    OB History    No data  available       Home Medications    Prior to Admission medications   Medication Sig Start Date End Date Taking? Authorizing Provider  aspirin EC 81 MG tablet Take 81 mg by mouth daily.   Yes [provider]  atenolol (TENORMIN) 25 MG tablet Take 25 mg by mouth daily.   Yes [provider]  atorvastatin (LIPITOR) 40 MG tablet Take 40 mg by mouth daily.   Yes [provider]  carbidopa-levodopa (SINEMET IR) 25-100 MG tablet Take 1 tablet by mouth daily.   Yes [provider]  ergocalciferol (VITAMIN D2) 50000 units capsule Take 50,000 Units by mouth once a week.   Yes [provider]  famotidine (PEPCID) 20 MG tablet Take 20 mg by mouth daily.   Yes [provider]  ferrous sulfate 324 (65 Fe) MG TBEC Take by mouth daily.   Yes [provider]  furosemide (LASIX) 20 MG tablet Take 20 mg by mouth daily.   Yes [provider]  Ipratropium-Albuterol (DUONEB IN) Inhale into the lungs 3 (three) times daily.   Yes [provider]  lisinopril (PRINIVIL,ZESTRIL) 10 MG tablet TAKE 1 TABLET BY MOUTH ONCE DAILY 05/17/17  Yes Laqueta Linden, MD  mirtazapine (REMERON) 15 MG tablet Take 15 mg by mouth at bedtime.   Yes [provider]  Nutritional Supplements (RA MELATONIN/B-6 PO) Take 1 tablet by mouth at bedtime.   Yes [provider]  PARoxetine (PAXIL) 20 MG tablet Take  20 mg by mouth daily.   Yes [provider]  polyethylene glycol (MIRALAX / GLYCOLAX) packet Take 17 g by mouth daily as needed for mild constipation.    Yes [provider]  rOPINIRole (REQUIP) 0.5 MG tablet Take 0.5 mg by mouth at bedtime. 2 tabs daily   Yes [provider]  traMADol (ULTRAM) 50 MG tablet Take by mouth every 6 (six) hours as needed.   Yes [provider]    Family History Family History  Problem Relation Age of Onset  . Congenital heart disease Mother   . Heart disease  Mother   . Peripheral vascular disease Father   . Pectus carinatum Son   . Heart failure Son   . Heart attack Son [id number]    Social History Social History   Tobacco Use  . Smoking status: Former Smoker    Packs/day: 1.00    Years: 60.00    Pack years: 60.00    Types: Cigarettes    Last attempt to quit: 08/30/2016    Years since quitting: 0.8  . Smokeless tobacco: Never Used  Substance Use Topics  . Alcohol use: No  . Drug use: No     Allergies   Aspirin and Penicillins   Review of Systems Review of Systems  Constitutional: Negative for appetite change and fatigue.  HENT: Negative for congestion, ear discharge and sinus pressure.   Eyes: Negative for discharge.  Respiratory: Negative for cough.   Cardiovascular: Positive for chest pain.  Gastrointestinal: Negative for abdominal pain and diarrhea.  Genitourinary: Negative for frequency and hematuria.  Musculoskeletal: Negative for back pain.  Skin: Negative for rash.  Neurological: Negative for seizures and headaches.  Psychiatric/Behavioral: Negative for hallucinations.     Physical Exam Updated Vital Signs BP (!) 183/91   Pulse 75   Temp 98.2 F (36.8 C) (Oral)   Resp 18   Ht 5\' 7"  (1.702 m)   Wt 83.9 kg (185 lb)   LMP  (LMP Unknown)   SpO2 94%   BMI 28.98 kg/m   Physical Exam  Constitutional: She is oriented to person, place, and time. She appears well-developed.  HENT:  Head: Normocephalic.  Eyes: Conjunctivae and EOM are normal. No scleral icterus.  Neck: Neck supple. No thyromegaly present.  Cardiovascular: Normal rate and regular rhythm. Exam reveals no gallop and no friction rub.  No murmur heard. Pulmonary/Chest: No stridor. She has no wheezes. She has no rales. She exhibits no tenderness.  Abdominal: She exhibits no distension. There is no tenderness. There is no rebound.  Musculoskeletal: Normal range of motion. She exhibits no edema.  Lymphadenopathy:    She has no cervical adenopathy.    Neurological: She is oriented to person, place, and time. She exhibits normal muscle tone. Coordination normal.  Skin: No rash noted. No erythema.  Psychiatric: She has a normal mood and affect. Her behavior is normal.     ED Treatments / Results  Labs (all labs ordered are listed, but only abnormal results are displayed) Labs Reviewed  CBC WITH DIFFERENTIAL/PLATELET - Abnormal; Notable for the following components:      Result Value   WBC 3.6 (*)    Platelets 132 (*)    All other components within normal limits  BASIC METABOLIC PANEL - Abnormal; Notable for the following components:   Glucose, Bld 166 (*)    All other components within normal limits  HEPATIC FUNCTION PANEL - Abnormal; Notable for the following components:   ALT  6 (*)    Indirect Bilirubin 1.0 (*)    All other components within normal limits  TROPONIN I  BRAIN NATRIURETIC PEPTIDE    EKG  EKG Interpretation  Date/Time:  Thursday July 22 2017 14:27:19 EST Ventricular Rate:  80 PR Interval:  158 QRS Duration: 102 QT Interval:  410 QTC Calculation: 472 R Axis:   -54 Text Interpretation:  Normal sinus rhythm Possible Left atrial enlargement Pulmonary disease pattern Incomplete right bundle branch block Left anterior fascicular block Left ventricular hypertrophy with repolarization abnormality Abnormal ECG Confirmed by Bethann Berkshire [phone]) on 07/22/2017 6:08:17 PM       Radiology Dg Chest 2 View  Result Date: 07/22/2017 CLINICAL DATA:  Chest pain and cough EXAM: CHEST  2 VIEW COMPARISON:  Chest radiograph Oct 19, 2016 and chest CT March 15, 2017 FINDINGS: There is mild atelectatic change in the left base. There is no edema or consolidation. Heart is upper normal in size with pulmonary vascularity within normal limits. Thoracic stent is again noted and stable. No bone lesions. No pneumothorax. IMPRESSION: Aortic stent again noted. Mild left base atelectasis. No edema or consolidation. Stable cardiac  silhouette. Electronically Signed   By: Bretta Bang III M.D.   On: 07/22/2017 16:14   Ct Angio Chest/abd/pel For Dissection W And/or Wo Contrast  Result Date: 07/22/2017 CLINICAL DATA:  77 y/o F; intermittent chest pain and hypertension. History of dissection and thoracic aortic stenting. EXAM: CT ANGIOGRAPHY CHEST, ABDOMEN AND PELVIS TECHNIQUE: Multidetector CT imaging through the chest, abdomen and pelvis was performed using the standard protocol during bolus administration of intravenous contrast. Multiplanar reconstructed images and MIPs were obtained and reviewed to evaluate the vascular anatomy. CONTRAST:  ISOVUE-370 IOPAMIDOL (ISOVUE-370) INJECTION 76% COMPARISON:  03/15/2017 CT angiogram of chest, abdomen, and pelvis. FINDINGS: CTA CHEST FINDINGS Cardiovascular: Stable stent of the aortic arch and descending thoracic aorta. Ascending aorta measures up to 3.4 cm, proximal arch 3.7 cm, distal arch/proximal descending, 4.2 cm, distal descending 3.8 cm. Proximal landing of stent is upstream to the brachiocephalic artery origin and distal landing upstream to the hiatus. 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 Enlarged pulmonary artery measuring 3.8 cm compatible pulmonary artery hypertension. Right ventricular enlargement. Moderate coronary artery calcification. Satisfactory opacification of pulmonary arteries, no pulmonary embolus identified. No pericardial effusion. Mediastinum/Nodes: No enlarged mediastinal, hilar, or axillary lymph nodes. Thyroid gland, trachea, and esophagus demonstrate no significant findings. Lungs/Pleura: Stable right lower lobe superior segment 5 mm nodule. Mild-to-moderate centrilobular emphysema of lung apices. Platelike atelectasis in lung bases. Left lower lobe superior segment calcified granuloma. Musculoskeletal: No chest wall abnormality. No acute or significant osseous findings. Review of the MIP images confirms the above findings. CTA ABDOMEN AND  PELVIS FINDINGS VASCULAR Aorta: Infrarenal abdominal aortic aneurysm measures 3.1 cm. Stable infrarenal abdominal aortic dissection. Celiac: Patent without evidence of aneurysm, dissection, vasculitis or significant stenosis. SMA: Patent without evidence of aneurysm, dissection, vasculitis or significant stenosis. Renals: Both renal arteries are patent without evidence of aneurysm, dissection, vasculitis, fibromuscular dysplasia or significant stenosis. Accessory right renal artery. IMA: Patent, false lumen origin, severe proximal stenosis. Inflow: Patent without evidence of aneurysm, dissection, vasculitis or significant stenosis. Mixed plaque of common and external iliac arteries with multiple segments of mild stenosis. Veins: No obvious venous abnormality within the limitations of this arterial phase study. Review of the MIP images confirms the above findings. NON-VASCULAR Hepatobiliary: No focal liver abnormality is seen. No gallstones, gallbladder wall thickening, or biliary dilatation. Pancreas: Unremarkable.  No pancreatic ductal dilatation or surrounding inflammatory changes. Spleen: Scattered calcified granulomata. Adrenals/Urinary Tract: Stable 2.3 cm right adrenal nodule, probably adenoma. Stable multiple renal cysts. No hydronephrosis. Normal bladder. Stomach/Bowel: Stomach is within normal limits. Appendix not identified, no pericecal inflammation. No evidence of bowel wall thickening, distention, or inflammatory changes. Sigmoid diverticulosis. Lymphatic: Aortic atherosclerosis. No enlarged abdominal or pelvic lymph nodes. Reproductive: Status post hysterectomy. No adnexal masses. Other: Chronic postsurgical changes within the lower ventral abdominal wall. Musculoskeletal: No fracture is seen. Review of the MIP images confirms the above findings. IMPRESSION: 1. Stable endovascular graft of aortic arch and descending thoracic aorta. 2. Stable thoracic aortic aneurysm measuring up to 4.2 cm and distal  arch/proximal descending segments. 3. Decreased size of infrarenal abdominal aortic aneurysm, 3.1 cm. Stable infrarenal abdominal aortic dissection. 4. Stable 2.3 cm right adrenal nodule, probably adenoma. 5. Enlarged main pulmonary artery and right ventricle, probably pulmonary artery hypertension. 6. Mild-to-moderate centrilobular emphysema. 7. Aortic atherosclerosis. 8. Stable right lower lobe superior segment 5 mm nodule. Recommendations as per prior study. Electronically Signed   By: Mitzi Hansen M.D.   On: 07/22/2017 17:19    Procedures Procedures (including critical care time)  Medications Ordered in ED Medications  atenolol (TENORMIN) tablet 25 mg (not administered)  hydrALAZINE (APRESOLINE) injection 5 mg (5 mg Intravenous Given 07/22/17 1507)  iopamidol (ISOVUE-370) 76 % injection 100 mL (100 mLs Intravenous Contrast Given 07/22/17 1625)     Initial Impression / Assessment and Plan / ED Course  I have reviewed the triage vital signs and the nursing notes.  Pertinent labs & imaging results that were available during my care of the patient were reviewed by me and considered in my medical decision making (see chart for details).     Patient has a history of thoracic and abdominal aneurysms.  CT angios shows that the aneurysms are stable.  Her blood pressure was improved with treatment.  We will increase her Tenormin to twice a day and she will follow-up with her PCP  Final Clinical Impressions(s) / ED Diagnoses   Final diagnoses:  Essential hypertension    ED Discharge Orders    None       Bethann Berkshire, MD 07/22/17 1811

## 2017-07-22 NOTE — ED Triage Notes (Signed)
Patient complaining of B/P 223/187 today. States she has had intermittent chest pain "lately but not for the last couple of days." Family member states patient has thoracic stent implanted.

## 2017-07-22 NOTE — Discharge Instructions (Signed)
Increase your atenolol to take 25 mg twice a day.  Follow-up with your doctor next week for recheck of your blood pressure

## 2017-12-05 ENCOUNTER — Other Ambulatory Visit: Payer: Self-pay | Admitting: Cardiovascular Disease

## 2017-12-05 DIAGNOSIS — I509 Heart failure, unspecified: Secondary | ICD-10-CM

## 2017-12-05 DIAGNOSIS — I10 Essential (primary) hypertension: Secondary | ICD-10-CM

## 2017-12-05 DIAGNOSIS — I711 Thoracic aortic aneurysm, ruptured, unspecified: Secondary | ICD-10-CM

## 2017-12-27 ENCOUNTER — Other Ambulatory Visit: Payer: Self-pay | Admitting: Cardiovascular Disease

## 2017-12-27 DIAGNOSIS — I711 Thoracic aortic aneurysm, ruptured, unspecified: Secondary | ICD-10-CM

## 2017-12-27 DIAGNOSIS — I10 Essential (primary) hypertension: Secondary | ICD-10-CM

## 2017-12-27 DIAGNOSIS — I509 Heart failure, unspecified: Secondary | ICD-10-CM

## 2018-02-01 ENCOUNTER — Other Ambulatory Visit: Payer: Self-pay

## 2018-02-01 ENCOUNTER — Emergency Department (HOSPITAL_COMMUNITY): Payer: Medicare HMO

## 2018-02-01 ENCOUNTER — Emergency Department (HOSPITAL_COMMUNITY)
Admission: EM | Admit: 2018-02-01 | Discharge: 2018-02-01 | Disposition: A | Discharge status: Home / Self Care | Payer: Medicare HMO | Attending: Emergency Medicine | Admitting: Emergency Medicine

## 2018-02-01 ENCOUNTER — Encounter (HOSPITAL_COMMUNITY): Payer: Self-pay | Admitting: Emergency Medicine

## 2018-02-01 DIAGNOSIS — Z87891 Personal history of nicotine dependence: Secondary | ICD-10-CM | POA: Insufficient documentation

## 2018-02-01 DIAGNOSIS — M545 Low back pain, unspecified: Secondary | ICD-10-CM

## 2018-02-01 DIAGNOSIS — I110 Hypertensive heart disease with heart failure: Secondary | ICD-10-CM | POA: Insufficient documentation

## 2018-02-01 DIAGNOSIS — I5042 Chronic combined systolic (congestive) and diastolic (congestive) heart failure: Secondary | ICD-10-CM | POA: Insufficient documentation

## 2018-02-01 DIAGNOSIS — J449 Chronic obstructive pulmonary disease, unspecified: Secondary | ICD-10-CM | POA: Insufficient documentation

## 2018-02-01 DIAGNOSIS — Z7982 Long term (current) use of aspirin: Secondary | ICD-10-CM | POA: Insufficient documentation

## 2018-02-01 DIAGNOSIS — I714 Abdominal aortic aneurysm, without rupture: Secondary | ICD-10-CM | POA: Diagnosis not present

## 2018-02-01 DIAGNOSIS — Z79899 Other long term (current) drug therapy: Secondary | ICD-10-CM | POA: Insufficient documentation

## 2018-02-01 DIAGNOSIS — I712 Thoracic aortic aneurysm, without rupture: Secondary | ICD-10-CM | POA: Diagnosis not present

## 2018-02-01 DIAGNOSIS — G8929 Other chronic pain: Secondary | ICD-10-CM

## 2018-02-01 HISTORY — DX: Abdominal aortic aneurysm, without rupture, unspecified: I71.40

## 2018-02-01 HISTORY — DX: Dissection of descending thoracic aorta: I71.012

## 2018-02-01 HISTORY — DX: Dissection of unspecified site of aorta: I71.00

## 2018-02-01 HISTORY — DX: Abdominal aortic aneurysm, without rupture: I71.4

## 2018-02-01 LAB — URINALYSIS, ROUTINE W REFLEX MICROSCOPIC
Bacteria, UA: NONE SEEN
Bilirubin Urine: NEGATIVE
Glucose, UA: NEGATIVE mg/dL
KETONES UR: NEGATIVE mg/dL
LEUKOCYTES UA: NEGATIVE
Nitrite: NEGATIVE
PROTEIN: NEGATIVE mg/dL
Specific Gravity, Urine: 1.005 (ref 1.005–1.030)
pH: 7 (ref 5.0–8.0)

## 2018-02-01 LAB — I-STAT CHEM 8, ED
BUN: 12 mg/dL (ref 8–23)
CREATININE: 0.7 mg/dL (ref 0.44–1.00)
Calcium, Ion: 1.23 mmol/L (ref 1.15–1.40)
Chloride: 98 mmol/L (ref 98–111)
Glucose, Bld: 94 mg/dL (ref 70–99)
HEMATOCRIT: 39 % (ref 36.0–46.0)
HEMOGLOBIN: 13.3 g/dL (ref 12.0–15.0)
POTASSIUM: 3.6 mmol/L (ref 3.5–5.1)
Sodium: 140 mmol/L (ref 135–145)
TCO2: 32 mmol/L (ref 22–32)

## 2018-02-01 MED ORDER — HYDROCODONE-ACETAMINOPHEN 5-325 MG PO TABS: ORAL_TABLET | ORAL | 0 refills | Status: DC

## 2018-02-01 MED ORDER — METHOCARBAMOL 500 MG PO TABS: 500.0000 mg | ORAL_TABLET | Freq: Two times a day (BID) | ORAL | 0 refills | Status: DC | PRN

## 2018-02-01 MED ORDER — MORPHINE SULFATE (PF) 2 MG/ML IV SOLN
2.0000 mg | INTRAVENOUS | Status: DC | PRN
  Administered 2018-02-01: 2 mg via INTRAVENOUS
  Filled 2018-02-01: qty 1

## 2018-02-01 MED ORDER — IOPAMIDOL (ISOVUE-370) INJECTION 76%
100.0000 mL | Freq: Once | INTRAVENOUS | Status: AC | PRN
  Administered 2018-02-01: 100 mL via INTRAVENOUS

## 2018-02-01 NOTE — ED Provider Notes (Signed)
[facility] EMERGENCY DEPARTMENT Provider Note   CSN: [id number] Arrival date & time: 02/01/18  1114     History   Chief Complaint Chief Complaint  Patient presents with  . Back Pain  . Hip Pain    HPI Briana Dean is a 77 y.o. female.   Back Pain    Hip Pain     Pt was seen at 1215.  Per pt, c/o gradual onset and persistence of constant acute flair of her chronic low back "pain" for the past 3 weeks. Pain worsens with palpation of the area and body position changes. Pt states the pain radiates into her left hip and LLE. Pt states "sometimes" it will radiate into her right hip. Pt states she was evaluated at OSH 3 weeks ago for this complaint, rx prednisone and robaxin without improvement. Denies incont/retention of bowel or bladder, no saddle anesthesia, no focal motor weakness, no tingling/numbness in extremities, no fevers, no injury, no abd pain, no CP/SOB. The symptoms have been associated with no other complaints.    Past Medical History:  Diagnosis Date  . AAA (abdominal aortic aneurysm) (HCC)   . Acute on chronic respiratory failure with hypoxia (HCC)   . Anemia, unspecified   . Aneurysm of thoracic aorta (HCC)   . Anxiety disorder   . Chronic combined systolic and diastolic heart failure (HCC)   . Constipation   . COPD (chronic obstructive pulmonary disease) (HCC)   . Dissection of distal aorta (HCC)   . Gastroesophageal reflux disease without esophagitis   . Hyperlipidemia, unspecified   . Hypertension, essential   . Insomnia, unspecified   . Low back pain   . Major depressive disorder, single episode, unspecified   . PAD (peripheral artery disease) (HCC)   . Respiratory failure (HCC)   . Restless leg syndrome   . Unspecified protein-calorie malnutrition (HCC)   . Weakness     Patient Active Problem List   Diagnosis Date Noted  . Congestive heart failure (HCC) 12/04/2016  . 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 Essential  hypertension 12/04/2016    Past Surgical History:  Procedure Laterality Date  . THORACIC AORTIC ANEURYSM REPAIR  09/06/2016   TEVAR  . TRACHEOSTOMY  08/2016  . VIDEO ASSISTED THORACOSCOPY (VATS)/THOROCOTOMY  08/2016     OB History   None      Home Medications    Prior to Admission medications   Medication Sig Start Date End Date Taking? Authorizing Provider  aspirin EC 81 MG tablet Take 81 mg by mouth daily.    [provider]  atenolol (TENORMIN) 25 MG tablet Take 25 mg by mouth daily.    [provider]  atorvastatin (LIPITOR) 40 MG tablet Take 40 mg by mouth daily.    [provider]  carbidopa-levodopa (SINEMET IR) 25-100 MG tablet Take 1 tablet by mouth daily.    [provider]  ergocalciferol (VITAMIN D2) 50000 units capsule Take 50,000 Units by mouth once a week.    [provider]  famotidine (PEPCID) 20 MG tablet Take 20 mg by mouth daily.    [provider]  ferrous sulfate 324 (65 Fe) MG TBEC Take by mouth daily.    [provider]  furosemide (LASIX) 20 MG tablet Take 20 mg by mouth daily.    [provider]  Ipratropium-Albuterol (DUONEB IN) Inhale into the lungs 3 (three) times daily.    [provider]  lisinopril (PRINIVIL,ZESTRIL) 10 MG tablet  TAKE 1 TABLET BY MOUTH ONCE DAILY 12/06/17   Laqueta Linden, MD  mirtazapine (REMERON) 15 MG tablet Take 15 mg by mouth at bedtime.    [provider]  Nutritional Supplements (RA MELATONIN/B-6 PO) Take 1 tablet by mouth at bedtime.    [provider]  PARoxetine (PAXIL) 20 MG tablet Take 20 mg by mouth daily.    [provider]  polyethylene glycol (MIRALAX / GLYCOLAX) packet Take 17 g by mouth daily as needed for mild constipation.     [provider]  rOPINIRole (REQUIP) 0.5 MG tablet Take 0.5 mg by mouth at bedtime. 2 tabs daily    [provider]  traMADol (ULTRAM) 50 MG tablet Take by mouth  every 6 (six) hours as needed.    [provider]    Family History Family History  Problem Relation Age of Onset  . Congenital heart disease Mother   . Heart disease Mother   . Peripheral vascular disease Father   . Pectus carinatum Son   . Heart failure Son   . Heart attack Son 47    Social History Social History   Tobacco Use  . Smoking status: Former Smoker    Packs/day: 1.00    Years: 60.00    Pack years: 60.00    Types: Cigarettes    Last attempt to quit: 08/30/2016    Years since quitting: 1.4  . Smokeless tobacco: Never Used  Substance Use Topics  . Alcohol use: No  . Drug use: No     Allergies   Aspirin and Penicillins   Review of Systems Review of Systems  Musculoskeletal: Positive for back pain.  ROS: Statement: All systems negative except as marked or noted in the HPI; Constitutional: Negative for fever and chills. ; ; Eyes: Negative for eye pain, redness and discharge. ; ; ENMT: Negative for ear pain, hoarseness, nasal congestion, sinus pressure and sore throat. ; ; Cardiovascular: Negative for chest pain, palpitations, diaphoresis, dyspnea and peripheral edema. ; ; Respiratory: Negative for cough, wheezing and stridor. ; ; Gastrointestinal: Negative for nausea, vomiting, diarrhea, abdominal pain, blood in stool, hematemesis, jaundice and rectal bleeding. . ; ; Genitourinary: Negative for dysuria, flank pain and hematuria. ; ; Musculoskeletal: +LBP. Negative for neck pain. Negative for swelling and trauma.; ; Skin: Negative for pruritus, rash, abrasions, blisters, bruising and skin lesion.; ; Neuro: Negative for headache, lightheadedness and neck stiffness. Negative for weakness, altered level of consciousness, altered mental status, extremity weakness, paresthesias, involuntary movement, seizure and syncope.       Physical Exam Updated Vital Signs BP (!) 138/100 (BP Location: Left Arm)   Pulse 68   Temp 98.3 F (36.8 C) (Oral)   Resp 16   Ht 5'  7" (1.702 m)   Wt 83.9 kg   LMP  (LMP Unknown)   SpO2 93%   BMI 28.97 kg/m   Physical Exam 1220: Physical examination:  Nursing notes reviewed; Vital signs and O2 SAT reviewed;  Constitutional: Well developed, Well nourished, Well hydrated, In no acute distress; Head:  Normocephalic, atraumatic; Eyes: EOMI, PERRL, No scleral icterus; ENMT: Mouth and pharynx normal, Mucous membranes moist; Neck: Supple, Full range of motion, No lymphadenopathy; Cardiovascular: Regular rate and rhythm, No gallop; Respiratory: Breath sounds clear & equal bilaterally, No wheezes.  Speaking full sentences with ease, Normal respiratory effort/excursion; Chest: Nontender, Movement normal; Abdomen: Soft, Nontender, Nondistended, Normal bowel sounds; Genitourinary: No CVA tenderness; Spine:  No midline CS, TS, LS tenderness. +  TTP bilat lumbar paraspinal muscles.;; Extremities: Peripheral pulses normal, Pelvis stable. +left hip mildly tender to palp. No deformity. NMS intact bilat LE's, muscles compartments soft, palp pedal pulses.  No edema, No calf tenderness, edema or asymmetry.; Neuro: AA&Ox3, Major CN grossly intact.  Speech clear. No gross focal motor or sensory deficits in extremities. Strength 5/5 equal bilat UE's and LE's. Climbs on and off stretcher easily by herself. Gait steady..; Skin: Color normal, Warm, Dry.   ED Treatments / Results  Labs (all labs ordered are listed, but only abnormal results are displayed)   EKG None  Radiology   Procedures Procedures (including critical care time)  Medications Ordered in ED Medications  morphine 2 MG/ML injection 2 mg (has no administration in time range)     Initial Impression / Assessment and Plan / ED Course  I have reviewed the triage vital signs and the nursing notes.  Pertinent labs & imaging results that were available during my care of the patient were reviewed by me and considered in my medical decision making (see chart for  details).  MDM Reviewed: previous chart, nursing note and vitals Reviewed previous: CT scan, x-ray and labs Interpretation: labs and CT scan   Results for orders placed or performed during the hospital encounter of 02/01/18  Urinalysis, Routine w reflex microscopic  Result Value Ref Range   Color, Urine STRAW (A) YELLOW   APPearance CLEAR CLEAR   Specific Gravity, Urine 1.005 1.005 - 1.030   pH 7.0 5.0 - 8.0   Glucose, UA NEGATIVE NEGATIVE mg/dL   Hgb urine dipstick SMALL (A) NEGATIVE   Bilirubin Urine NEGATIVE NEGATIVE   Ketones, ur NEGATIVE NEGATIVE mg/dL   Protein, ur NEGATIVE NEGATIVE mg/dL   Nitrite NEGATIVE NEGATIVE   Leukocytes, UA NEGATIVE NEGATIVE   RBC / HPF 0-5 0 - 5 RBC/hpf   WBC, UA 0-5 0 - 5 WBC/hpf   Bacteria, UA NONE SEEN NONE SEEN   Squamous Epithelial / LPF 0-5 0 - 5  I-stat Chem 8, ED  Result Value Ref Range   Sodium 140 135 - 145 mmol/L   Potassium 3.6 3.5 - 5.1 mmol/L   Chloride 98 98 - 111 mmol/L   BUN 12 8 - 23 mg/dL   Creatinine, Ser 1.61 0.44 - 1.00 mg/dL   Glucose, Bld 94 70 - 99 mg/dL   Calcium, Ion 0.96 0.45 - 1.40 mmol/L   TCO2 32 22 - 32 mmol/L   Hemoglobin 13.3 12.0 - 15.0 g/dL   HCT 40.9 81.1 - 91.4 %   Ct Angio Chest/abd/pel For Dissection W And/or W/wo Result Date: 02/01/2018 CLINICAL DATA:  Lower back pain. History of abdominal aortic aneurysm. EXAM: CT ANGIOGRAPHY CHEST, ABDOMEN AND PELVIS TECHNIQUE: Multidetector CT imaging through the chest, abdomen and pelvis was performed using the standard protocol during bolus administration of intravenous contrast. Multiplanar reconstructed images and MIPs were obtained and reviewed to evaluate the vascular anatomy. CONTRAST:  ISOVUE-370 IOPAMIDOL (ISOVUE-370) INJECTION 76% COMPARISON:  CT scan of July 22, 2017 and March 15, 2017. FINDINGS: CTA CHEST FINDINGS Cardiovascular: Status post stent graft repair of thoracic aortic arch and descending thoracic aorta. Ascending thoracic aorta  measures 3.4 cm in diameter. Aortic arch measures 3.7 cm. Proximal descending thoracic aorta measures 4.2 cm. Mild coronary artery calcifications are noted. No pericardial effusion is noted. Mediastinum/Nodes: No enlarged mediastinal, hilar, or axillary lymph nodes. Thyroid gland, trachea, and esophagus demonstrate no significant findings. Lungs/Pleura: No pneumothorax or pleural effusion is noted. Mild  biapical scarring is noted. Mild emphysematous disease is noted in both upper lobes. Stable calcified granuloma is seen in left lower lobe. Stable right posterior basilar scarring is noted. Stable 5 mm nodule is noted in superior segment of right lower lobe best seen on image number 78 of series 8. Musculoskeletal: No chest wall abnormality. No acute or significant osseous findings. Review of the MIP images confirms the above findings. CTA ABDOMEN AND PELVIS FINDINGS VASCULAR Aorta: Stable 3.0 cm infrarenal abdominal aortic aneurysm is noted. Stable infrarenal abdominal aortic dissection is noted which extends to aortic bifurcation. Atherosclerosis of abdominal aorta is noted. Celiac: Patent without evidence of aneurysm, dissection, vasculitis or significant stenosis. SMA: Patent without evidence of aneurysm, dissection, vasculitis or significant stenosis. Renals: Both renal arteries are patent without evidence of aneurysm, dissection, vasculitis, fibromuscular dysplasia or significant stenosis. IMA: Severe stenosis is noted at its origin as it arises from false lumen of dissection in the infrarenal abdominal aorta. Inflow: Patent without evidence of aneurysm, dissection, vasculitis or significant stenosis. Veins: No obvious venous abnormality within the limitations of this arterial phase study. Review of the MIP images confirms the above findings. NON-VASCULAR Hepatobiliary: No focal liver abnormality is seen. No gallstones, gallbladder wall thickening, or biliary dilatation. Pancreas: Unremarkable. No pancreatic  ductal dilatation or surrounding inflammatory changes. Spleen: Calcified splenic granulomata are noted. Adrenals/Urinary Tract: Stable 2.3 cm right adrenal adenoma is noted. Left adrenal gland is unremarkable. Stable bilateral renal cysts are noted. Nonobstructive left renal calculus is noted. No hydronephrosis or renal obstruction is noted. Urinary bladder is unremarkable. No ureteral calculi are noted. Stomach/Bowel: Stomach is within normal limits. Appendix appears normal. No evidence of bowel wall thickening, distention, or inflammatory changes. Sigmoid diverticulosis is noted without inflammation. Lymphatic: No significant adenopathy is noted. Reproductive: Status post hysterectomy. No adnexal masses. Other: No abdominal wall hernia or abnormality. No abdominopelvic ascites. Musculoskeletal: No acute or significant osseous findings. Review of the MIP images confirms the above findings. IMPRESSION: Status post stent graft placement involving thoracic aortic arch and descending thoracic aorta. Stable 4.2 cm proximal descending thoracic aortic aneurysm. Stable 3.0 cm infrarenal abdominal aortic aneurysm. Recommend followup by ultrasound in 3 years. This recommendation follows ACR consensus guidelines: White Paper of the ACR Incidental Findings Committee II on Vascular Findings. J Am Coll Radiol 2013; 10:789-794. Stable dissection involving infrarenal abdominal aorta extending to the aortic bifurcation. Severe stenosis is seen involving the origin of the inferior mesenteric artery which arises from the false lumen of the dissection. Stable 5 mm right lower lobe nodule. Follow-up unenhanced chest CT in 3-6 months is recommended to ensure stability. Stable 2.3 cm right adrenal adenoma. Nonobstructive left renal calculus. No hydronephrosis or renal obstruction is noted. Sigmoid diverticulosis without inflammation. Aortic Atherosclerosis (ICD10-I70.0) and Emphysema (ICD10-J43.9). Electronically Signed   By: Lupita Raider, M.D.   On: 02/01/2018 14:29   Dg Hips Bilat W Or Wo Pelvis 5 Views Result Date: 02/01/2018 CLINICAL DATA:  Two weeks of bilateral hip pain which is worsening. No known injury. EXAM: DG HIP (WITH OR WITHOUT PELVIS) 5+V BILAT COMPARISON:  Coronal and sagittal reconstructed images through the pelvis and hips from a CT scan of the abdomen and pelvis of March 15, 2017 FINDINGS: The bones are subjectively osteopenic. The hip joint spaces are reasonably well maintained. The articular surfaces of the femoral heads and acetabuli remains smoothly rounded. The femoral necks, intertrochanteric, and subtrochanteric regions are normal. IMPRESSION: There is no acute or significant chronic bony abnormality  of either hip. There is mild diffuse osteopenia. Electronically Signed   By: David  [place] M.D.   On: 02/01/2018 14:22      1505:  T/C returned from [facility] Dr. Edilia Bo, case discussed, including:  HPI, pertinent PM/SHx, VS/PE, dx testing, ED course and treatment:  He has viewed the CT images, states no acute vascular findings that would cause pt's symptoms, have pt call office for f/u.   1540:  Spoke with [facility] Dr. Gery Pray regarding LS on CT scan: Old T12 fx and subacute L3 endplate fracture is present, otherwise no significant other findings.   1600:  Pt has ambulated with her cane with her usual baseline gait per pt and family observing. NAD, resps easy. Pt states she also has a walker at home. Pt to use walker at home, tx symptomatically, f/u PMD and Ortho MD. Dx and testing d/w pt and family.  Questions answered.  Verb understanding, agreeable to d/c home with outpt f/u.       Final Clinical Impressions(s) / ED Diagnoses   Final diagnoses:  None    ED Discharge Orders    None       Samuel Jester, DO 02/04/18 [id number]

## 2018-02-01 NOTE — Discharge Instructions (Addendum)
Take the prescriptions as directed.  Apply moist heat or ice to the area(s) of discomfort, for 15 minutes at a time, several times per day for the next few days.  Do not fall asleep on a heating or ice pack. Your CT scan shows you have an old T12 compression fracture, and possibly a more recent L3 endplate fracture.  Call your regular medical doctor tomorrow to schedule a follow up appointment in the next 2 days. Call either the Orthopedist or the Neurosurgeon tomorrow to schedule a follow up appointment within the next week.  Return to the Emergency Department immediately if worsening.

## 2018-02-01 NOTE — ED Triage Notes (Signed)
Patient complains of lower back pain that radiates down to bilateral hips x 2 weeks. States she was seen at [facility] and was given prednisone and robaxin. She states pain has gotten worse and she can not stand for very long now.

## 2018-02-01 NOTE — ED Notes (Signed)
Pt took a few steps in room with cane. Pt states she is "a little wobbly" but that is her normal at home also.

## 2018-02-03 LAB — URINE CULTURE: Culture: NO GROWTH

## 2018-02-04 ENCOUNTER — Other Ambulatory Visit: Payer: Self-pay

## 2018-02-04 DIAGNOSIS — I716 Thoracoabdominal aortic aneurysm, without rupture, unspecified: Secondary | ICD-10-CM

## 2018-02-23 ENCOUNTER — Ambulatory Visit (INDEPENDENT_AMBULATORY_CARE_PROVIDER_SITE_OTHER): Payer: Medicare HMO | Admitting: Orthopedic Surgery

## 2018-02-23 ENCOUNTER — Encounter: Payer: Self-pay | Admitting: Orthopedic Surgery

## 2018-02-23 ENCOUNTER — Ambulatory Visit (INDEPENDENT_AMBULATORY_CARE_PROVIDER_SITE_OTHER): Payer: Medicare HMO

## 2018-02-23 VITALS — BP 161/91 | HR 74 | Ht 67.0 in

## 2018-02-23 DIAGNOSIS — M5441 Lumbago with sciatica, right side: Secondary | ICD-10-CM | POA: Diagnosis not present

## 2018-02-23 DIAGNOSIS — G8929 Other chronic pain: Secondary | ICD-10-CM | POA: Diagnosis not present

## 2018-02-23 DIAGNOSIS — M5442 Lumbago with sciatica, left side: Secondary | ICD-10-CM

## 2018-02-23 MED ORDER — ALPRAZOLAM 0.25 MG PO TABS: 0.2500 mg | ORAL_TABLET | Freq: Once | ORAL | 0 refills | Status: AC

## 2018-02-23 NOTE — Progress Notes (Signed)
NEW PATIENT OFFICE VISI  Chief Complaint  Patient presents with  . Back Pain    Lower back pain radiating down to left knee and bilat hips    77 year old female presents with pain in her lower back started in August she was seen by primary care got a steroid injection a muscle relaxer and pain medication she did not improve went to the ER had x-rays of her hips for some reason they were normal  She denies incontinence or retention bowel or bladder no saddle anesthesia no focal motor weakness although she says her left leg is weak she denies tingling or numbness has not had fever no injury no shortness of breath or chest pain  She says she has severe lower back pain which is been unresponsive to conservative treatment for more than 6 weeks its a dull deep severe ache in the lower back it runs down the left leg to the knee and is severe and is keeping her from walking   Review of Systems  Constitutional: Negative for malaise/fatigue and weight loss.  Gastrointestinal: Negative.   Genitourinary: Negative.   Musculoskeletal: Positive for back pain.  Neurological: Positive for weakness.     Past Medical History:  Diagnosis Date  . AAA (abdominal aortic aneurysm) (HCC)   . Acute on chronic respiratory failure with hypoxia (HCC)   . Anemia, unspecified   . Aneurysm of thoracic aorta (HCC)   . Anxiety disorder   . Chronic combined systolic and diastolic heart failure (HCC)   . Constipation   . COPD (chronic obstructive pulmonary disease) (HCC)   . Dissection of distal aorta (HCC)   . Gastroesophageal reflux disease without esophagitis   . Hyperlipidemia, unspecified   . Hypertension, essential   . Insomnia, unspecified   . Low back pain   . Major depressive disorder, single episode, unspecified   . PAD (peripheral artery disease) (HCC)   . Respiratory failure (HCC)   . Restless leg syndrome   . Unspecified protein-calorie malnutrition (HCC)   . Weakness     Past Surgical  History:  Procedure Laterality Date  . THORACIC AORTIC ANEURYSM REPAIR  09/06/2016   TEVAR  . TRACHEOSTOMY  08/2016  . VIDEO ASSISTED THORACOSCOPY (VATS)/THOROCOTOMY  08/2016    Family History  Problem Relation Age of Onset  . Congenital heart disease Mother   . Heart disease Mother   . Peripheral vascular disease Father   . Alcoholism Father   . Pectus carinatum Son   . Heart failure Son   . Heart attack Son 54  . COPD Sister    Social History   Tobacco Use  . Smoking status: Former Smoker    Packs/day: 1.00    Years: 60.00    Pack years: 60.00    Types: Cigarettes    Last attempt to quit: 08/30/2016    Years since quitting: 1.4  . Smokeless tobacco: Never Used  Substance Use Topics  . Alcohol use: No  . Drug use: No    Allergies  Allergen Reactions  . Aspirin Other (See Comments)    Stomach pain  . Penicillins     Has patient had a PCN reaction causing immediate rash, facial/tongue/throat swelling, SOB or lightheadedness with hypotension No Has patient had a PCN reaction causing severe rash involving mucus membranes or skin necrosis: No Has patient had a PCN reaction that required hospitalization: No Has patient had a PCN reaction occurring within the last 10 years: No If all of the above  answers are "NO", then may proceed with Cephalosporin use.     Current Meds  Medication Sig  . aspirin EC 81 MG tablet Take 81 mg by mouth daily.  Marland Kitchen atenolol (TENORMIN) 25 MG tablet Take 25 mg by mouth daily.  Marland Kitchen atorvastatin (LIPITOR) 40 MG tablet Take 40 mg by mouth daily.  . carbidopa-levodopa (SINEMET IR) 25-100 MG tablet Take 1 tablet by mouth daily.  . ergocalciferol (VITAMIN D2) 50000 units capsule Take 50,000 Units by mouth once a week.  . famotidine (PEPCID) 20 MG tablet Take 20 mg by mouth daily.  . ferrous sulfate 324 (65 Fe) MG TBEC Take by mouth daily.  . furosemide (LASIX) 20 MG tablet Take 20 mg by mouth daily.  Marland Kitchen HYDROcodone-acetaminophen (NORCO/VICODIN) 5-325  MG tablet 1 tab PO q12 hours prn pain  . Ipratropium-Albuterol (DUONEB IN) Inhale into the lungs 3 (three) times daily.  Marland Kitchen lisinopril (PRINIVIL,ZESTRIL) 10 MG tablet TAKE 1 TABLET BY MOUTH ONCE DAILY  . methocarbamol (ROBAXIN) 500 MG tablet Take 1 tablet (500 mg total) by mouth 2 (two) times daily as needed for muscle spasms.  . Nutritional Supplements (RA MELATONIN/B-6 PO) Take 1 tablet by mouth at bedtime.  Marland Kitchen PARoxetine (PAXIL) 20 MG tablet Take 20 mg by mouth daily.  . polyethylene glycol (MIRALAX / GLYCOLAX) packet Take 17 g by mouth daily as needed for mild constipation.   Marland Kitchen rOPINIRole (REQUIP) 0.5 MG tablet Take 0.5 mg by mouth at bedtime. 2 tabs daily  . traMADol (ULTRAM) 50 MG tablet Take by mouth every 6 (six) hours as needed.    BP (!) 161/91   Pulse 74   Ht 5\' 7"  (1.702 m)   LMP  (LMP Unknown)   BMI 28.97 kg/m   Physical Exam  Constitutional: She is oriented to person, place, and time. She appears well-developed and well-nourished.  Neurological: She is alert and oriented to person, place, and time.  Psychiatric: She has a normal mood and affect. Judgment normal.  Vitals reviewed.    Back Exam   Tenderness  The patient is experiencing tenderness in the lumbar.  Range of Motion  Extension: abnormal  Flexion: abnormal  Lateral bend left: abnormal  Rotation left: abnormal   Muscle Strength  The patient has normal back strength.  Tests  Straight leg raise right: negative Straight leg raise left: positive  Reflexes  Patellar: 1/4 Achilles: 1/4 Babinski's sign: normal   Other  Toe walk: normal Heel walk: normal Sensation: decreased Erythema: no back redness Scars: absent        MEDICAL DECISION SECTION  Xrays were done at [facility] pelvis AP lateral right and left hip.  No significant degenerative changes noted on the x-ray after independent review  I also took an x-ray of her lumbar spine  Encounter Diagnosis  Name Primary?  .  Chronic bilateral low back pain with bilateral sciatica Yes    PLAN: (Rx., injectx, surgery, frx, mri/ct) 77 year old female previously treated with steroid injection muscle relaxer and pain medicine by primary care with greater than 6 weeks of symptoms which began in early August has not improved as a positive straight leg raise on the left and altered sensation on the left will need CT or MRI depending on whether or not she has any hardware or pacemaker type devices to evaluate her neuraxis  She qualifies based on her age greater than 41 and treatment for greater than 6 weeks  No orders of the defined types were placed  in this encounter.   Fuller Canada, MD  02/23/2018 10:23 AM

## 2018-02-24 ENCOUNTER — Telehealth: Payer: Self-pay | Admitting: Radiology

## 2018-02-24 NOTE — Telephone Encounter (Signed)
Called to advise of MRI appointment and made a follow up appointment.

## 2018-03-01 ENCOUNTER — Ambulatory Visit (HOSPITAL_COMMUNITY)
Admission: RE | Admit: 2018-03-01 | Discharge: 2018-03-01 | Disposition: A | Discharge status: Home / Self Care | Payer: Medicare HMO | Source: Ambulatory Visit | Attending: Orthopedic Surgery | Admitting: Orthopedic Surgery

## 2018-03-01 DIAGNOSIS — M5126 Other intervertebral disc displacement, lumbar region: Secondary | ICD-10-CM | POA: Diagnosis not present

## 2018-03-01 DIAGNOSIS — M5441 Lumbago with sciatica, right side: Secondary | ICD-10-CM | POA: Insufficient documentation

## 2018-03-01 DIAGNOSIS — M4856XA Collapsed vertebra, not elsewhere classified, lumbar region, initial encounter for fracture: Secondary | ICD-10-CM | POA: Insufficient documentation

## 2018-03-01 DIAGNOSIS — M5136 Other intervertebral disc degeneration, lumbar region: Secondary | ICD-10-CM | POA: Diagnosis not present

## 2018-03-01 DIAGNOSIS — M5442 Lumbago with sciatica, left side: Secondary | ICD-10-CM | POA: Diagnosis not present

## 2018-03-01 DIAGNOSIS — G8929 Other chronic pain: Secondary | ICD-10-CM | POA: Insufficient documentation

## 2018-03-01 DIAGNOSIS — M48061 Spinal stenosis, lumbar region without neurogenic claudication: Secondary | ICD-10-CM | POA: Insufficient documentation

## 2018-03-03 ENCOUNTER — Telehealth: Payer: Self-pay | Admitting: Radiology

## 2018-03-03 ENCOUNTER — Encounter (HOSPITAL_COMMUNITY): Payer: Self-pay | Admitting: Radiology

## 2018-03-03 DIAGNOSIS — S32000A Wedge compression fracture of unspecified lumbar vertebra, initial encounter for closed fracture: Secondary | ICD-10-CM

## 2018-03-03 NOTE — Telephone Encounter (Signed)
-----   Message from Vickki Hearing, MD sent at 03/02/2018  5:24 PM EDT ----- I asked Dr. Ludger Nutting sure if she is a candidate for vertebroplasty or kyphoplasty

## 2018-03-03 NOTE — Telephone Encounter (Signed)
Put in the referral for consult and patient has appointment tomorrow will discuss

## 2018-03-03 NOTE — Telephone Encounter (Signed)
To Dr Corliss Skains for kyphoplasty consult will work on this today and call patient

## 2018-03-04 ENCOUNTER — Telehealth (HOSPITAL_COMMUNITY): Payer: Self-pay | Admitting: Radiology

## 2018-03-04 ENCOUNTER — Ambulatory Visit (INDEPENDENT_AMBULATORY_CARE_PROVIDER_SITE_OTHER): Payer: Medicare HMO | Admitting: Orthopedic Surgery

## 2018-03-04 ENCOUNTER — Encounter: Payer: Self-pay | Admitting: Orthopedic Surgery

## 2018-03-04 ENCOUNTER — Other Ambulatory Visit (HOSPITAL_COMMUNITY): Payer: Self-pay | Admitting: Interventional Radiology

## 2018-03-04 VITALS — BP 185/96 | HR 68 | Ht 67.0 in

## 2018-03-04 DIAGNOSIS — G8929 Other chronic pain: Secondary | ICD-10-CM | POA: Diagnosis not present

## 2018-03-04 DIAGNOSIS — S32000A Wedge compression fracture of unspecified lumbar vertebra, initial encounter for closed fracture: Secondary | ICD-10-CM

## 2018-03-04 DIAGNOSIS — M5441 Lumbago with sciatica, right side: Secondary | ICD-10-CM

## 2018-03-04 DIAGNOSIS — S32030A Wedge compression fracture of third lumbar vertebra, initial encounter for closed fracture: Secondary | ICD-10-CM

## 2018-03-04 DIAGNOSIS — S32040A Wedge compression fracture of fourth lumbar vertebra, initial encounter for closed fracture: Secondary | ICD-10-CM

## 2018-03-04 DIAGNOSIS — M5442 Lumbago with sciatica, left side: Secondary | ICD-10-CM

## 2018-03-04 NOTE — Telephone Encounter (Signed)
Called pt, left VM for her to call to schedule consult with Deveshwar for management of L3/L4 compression fractures. JM

## 2018-03-04 NOTE — Progress Notes (Signed)
Chief Complaint  Patient presents with  . Back Pain    MRI results    77 year old female with back pain primarily in the lower back some in the upper lower back but mainly complaining of bilateral lower leg pain.  Her MRI is bloated below I reviewed.  I agree with the findings as stated she has vertebral compression fractures and spinal stenosis   CLINICAL DATA:  Low back pain extending down both legs, 2 years duration.   EXAM: MRI LUMBAR SPINE WITHOUT CONTRAST   TECHNIQUE: Multiplanar, multisequence MR imaging of the lumbar spine was performed. No intravenous contrast was administered.   COMPARISON:  Radiography 02/23/2018.  CT 03/15/2017.   FINDINGS: Segmentation:  5 lumbar type vertebral bodies.   Alignment: Curvature convex to the left with the apex at L2-3. 3 mm anterolisthesis L4-5.   Vertebrae: Old healed compression deformity at T12 with loss of height of 30%. Recent inferior endplate compression fracture at L3 and superior endplate compression fracture at L4. Loss of height of 40% at L3 and 30% at L4. Old healed minor superior endplate depression at L5.   Conus medullaris and cauda equina: Conus extends to the L1 level. Conus and cauda equina appear normal.   Paraspinal and other soft tissues: Aortic atherosclerosis.   Disc levels:   T12-L1: Mild bulging of the disc.  No stenosis.   L1-2: Normal.   L2-3: Mild bulging of the disc. Mild facet hypertrophy. No stenosis.   L3-4: Endplate osteophytes and bulging of the disc. Facet and ligamentous hypertrophy. Moderate multifactorial stenosis that could cause neural compression on either or both sides. Facet edema left more than right could also contribute to back pain.   L4-5: Bilateral facet degeneration. 3 mm of anterolisthesis. Disc degeneration and bulging of the disc. Mild stenosis of the lateral recesses and foramina, left more than right.   L5-S1: Mild disc bulge.  Mild facet degeneration.  No  stenosis.   IMPRESSION: Acute or subacute inferior endplate compression fracture at L3 with loss of height of 40%. Acute or subacute superior endplate fracture at L4 with loss of height of 30%. These look like benign fractures.   Moderate multifactorial stenosis at the L3-4 level because of bulging of the disc in combination with facet and ligamentous hypertrophy.   Disc degeneration and facet degeneration at L4-5 with 3 mm of anterolisthesis. Mild stenosis of the lateral recesses and foramina left more than right but no definite neural compression.     Electronically Signed   By: Paulina Fusi M.D. 03/01/18  She maintains her bowel and bladder function  He is on a baby aspirin  She will get a epidural series at L3-4 at [facility] imaging and see [place] after the third injection  She is not a good surgical candidate and she did not want to get vertebroplasty's and does not seem to be having a lot of pain from the L3-L4 compression fractures

## 2018-03-07 ENCOUNTER — Ambulatory Visit (HOSPITAL_COMMUNITY)
Admission: RE | Admit: 2018-03-07 | Discharge: 2018-03-07 | Disposition: A | Discharge status: Home / Self Care | Payer: Medicare HMO | Source: Ambulatory Visit | Attending: Interventional Radiology | Admitting: Interventional Radiology

## 2018-03-07 DIAGNOSIS — S32030A Wedge compression fracture of third lumbar vertebra, initial encounter for closed fracture: Secondary | ICD-10-CM

## 2018-03-07 DIAGNOSIS — S32040A Wedge compression fracture of fourth lumbar vertebra, initial encounter for closed fracture: Secondary | ICD-10-CM

## 2018-03-07 NOTE — Consult Note (Signed)
Chief Complaint: Patient was seen in consultation today for lumbar 3 and lumbar 4 compression fractures.  Referring Physician(s): Vickki Hearing  Supervising Physician: Julieanne Cotton  Patient Status: [facility] - Out-pt  History of Present Illness: Briana Dean is a 77 y.o. female with a past medical history of hypertension, systolic and diastolic HF, hyperlipidemia, AAA with dissection of distal aorta, peripheral artery disease, COPD, acute on chronic respiratory failure, GERD, anemia, restless leg syndrome, major depressive disorder, anxiety, insomnia, and prior tobacco use. She began developing low back pain in 12/2017 without known fall/injury. She first went to her PCP for management and received a steroid injection, pain medication, and a muscle relaxer. With little relief of symptoms following PCP visit, she presented to [facility] ED 02/01/2018 for further management. Following inconclusive findings of the cause of her back pain, she was then referred to Dr. Romeo Apple (orthopedics) for further management. She was seen and evaluated by Dr. Romeo Apple 02/23/2018, who ordered MRI lumbar spine.  MRI lumbar spine 03/01/2018: 1. Acute or subacute inferior endplate compression fracture at L3 with loss of height of 40%. Acute or subacute superior endplate fracture at L4 with loss of height of 30%. These look like benign fractures. 2. Moderate multifactorial stenosis at the L3-4 level because of bulging of the disc in combination with facet and ligamentous hypertrophy. 3. Disc degeneration and facet degeneration at L4-5 with 3 mm of anterolisthesis. Mild stenosis of the lateral recesses and foramina left more than right but no definite neural compression.  IR requested by Dr. Romeo Apple for management of lumbar 3 and lumbar 4 compression fractures. Patient awake and alert sitting in wheelchair. Accompanied by daughter-in-law. Complains of low back pain, rated 8-10/10. States that she has associated pain  that shots down her anterior/lateral bilateral legs to knees. Has tried Norco and Robaxin with some relief of symptoms. Denies fever, chills, chest pain, dyspnea, abdominal pain, bladder/bowel incontinence, urinary symptoms including dysuria and frequency, or numbness/tingling dow legs.  Patient is currently taking Aspirin 81 mg once daily.   Past Medical History:  Diagnosis Date  . AAA (abdominal aortic aneurysm) (HCC)   . Acute on chronic respiratory failure with hypoxia (HCC)   . Anemia, unspecified   . Aneurysm of thoracic aorta (HCC)   . Anxiety disorder   . Chronic combined systolic and diastolic heart failure (HCC)   . Constipation   . COPD (chronic obstructive pulmonary disease) (HCC)   . Dissection of distal aorta (HCC)   . Gastroesophageal reflux disease without esophagitis   . Hyperlipidemia, unspecified   . Hypertension, essential   . Insomnia, unspecified   . Low back pain   . Major depressive disorder, single episode, unspecified   . PAD (peripheral artery disease) (HCC)   . Respiratory failure (HCC)   . Restless leg syndrome   . Unspecified protein-calorie malnutrition (HCC)   . Weakness     Past Surgical History:  Procedure Laterality Date  . THORACIC AORTIC ANEURYSM REPAIR  09/06/2016   TEVAR  . TRACHEOSTOMY  08/2016  . VIDEO ASSISTED THORACOSCOPY (VATS)/THOROCOTOMY  08/2016    Allergies: Aspirin and Penicillins  Medications: Prior to Admission medications   Medication Sig Start Date End Date Taking? Authorizing Provider  aspirin EC 81 MG tablet Take 81 mg by mouth daily.    [provider]  atenolol (TENORMIN) 25 MG tablet Take 25 mg by mouth daily.    [provider]  atorvastatin (LIPITOR) 40 MG tablet Take 40 mg by  mouth daily.    [provider]  carbidopa-levodopa (SINEMET IR) 25-100 MG tablet Take 1 tablet by mouth daily.    [provider]  ergocalciferol (VITAMIN D2) 50000 units capsule Take 50,000 Units by  mouth once a week.    [provider]  famotidine (PEPCID) 20 MG tablet Take 20 mg by mouth daily.    [provider]  ferrous sulfate 324 (65 Fe) MG TBEC Take by mouth daily.    [provider]  furosemide (LASIX) 20 MG tablet Take 20 mg by mouth daily.    [provider]  HYDROcodone-acetaminophen (NORCO/VICODIN) 5-325 MG tablet 1 tab PO q12 hours prn pain 02/01/18   Samuel Jester, DO  Ipratropium-Albuterol (DUONEB IN) Inhale into the lungs 3 (three) times daily.    [provider]  lisinopril (PRINIVIL,ZESTRIL) 10 MG tablet TAKE 1 TABLET BY MOUTH ONCE DAILY 12/06/17   Laqueta Linden, MD  methocarbamol (ROBAXIN) 500 MG tablet Take 1 tablet (500 mg total) by mouth 2 (two) times daily as needed for muscle spasms. 02/01/18   Samuel Jester, DO  Nutritional Supplements (RA MELATONIN/B-6 PO) Take 1 tablet by mouth at bedtime.    [provider]  PARoxetine (PAXIL) 20 MG tablet Take 20 mg by mouth daily.    [provider]  polyethylene glycol (MIRALAX / GLYCOLAX) packet Take 17 g by mouth daily as needed for mild constipation.     [provider]  rOPINIRole (REQUIP) 0.5 MG tablet Take 0.5 mg by mouth at bedtime. 2 tabs daily    [provider]  traMADol (ULTRAM) 50 MG tablet Take by mouth every 6 (six) hours as needed.    [provider]     Family History  Problem Relation Age of Onset  . Congenital heart disease Mother   . Heart disease Mother   . Peripheral vascular disease Father   . Alcoholism Father   . Pectus carinatum Son   . Heart failure Son   . Heart attack Son 78  . COPD Sister     Social History   Socioeconomic History  . Marital status: Single    Spouse name: Not on file  . Number of children: Not on file  . Years of education: Not on file  . Highest education level: Not on file  Occupational History  . Not on file  Social Needs  . Financial resource strain: Not on file    . Food insecurity:    Worry: Not on file    Inability: Not on file  . Transportation needs:    Medical: Not on file    Non-medical: Not on file  Tobacco Use  . Smoking status: Former Smoker    Packs/day: 1.00    Years: 60.00    Pack years: 60.00    Types: Cigarettes    Last attempt to quit: 08/30/2016    Years since quitting: 1.5  . Smokeless tobacco: Never Used  Substance and Sexual Activity  . Alcohol use: No  . Drug use: No  . Sexual activity: Not on file  Lifestyle  . Physical activity:    Days per week: Not on file    Minutes per session: Not on file  . Stress: Not on file  Relationships  . Social connections:    Talks on phone: Not on file    Gets together: Not on file    Attends religious service: Not on file    Active member of club or  organization: Not on file    Attends meetings of clubs or organizations: Not on file    Relationship status: Not on file  Other Topics Concern  . Not on file  Social History Narrative  . Not on file     Review of Systems: A 12 point ROS discussed and pertinent positives are indicated in the HPI above.  All other systems are negative.  Review of Systems  Constitutional: Negative for chills and fever.  Respiratory: Negative for shortness of breath and wheezing.   Cardiovascular: Negative for chest pain and palpitations.  Gastrointestinal: Negative for abdominal pain.       Negative for bowel incontinence.  Genitourinary: Negative for dysuria and frequency.       Negative for bladder incontinence.  Musculoskeletal: Positive for back pain.  Neurological: Negative for numbness.  Psychiatric/Behavioral: Negative for behavioral problems and confusion.    Vital Signs: LMP  (LMP Unknown)   Physical Exam  Constitutional: She is oriented to person, place, and time. She appears well-developed and well-nourished. No distress.  Pulmonary/Chest: Effort normal. No respiratory distress.  Musculoskeletal:  Mild-moderate tenderness of  midline spine at approximate levels of lumbar 3/4.  Neurological: She is alert and oriented to person, place, and time.  Skin: Skin is warm and dry.  Psychiatric: She has a normal mood and affect. Her behavior is normal. Judgment and thought content normal.     Imaging: Dg Lumbar Spine 2-3 Views  Result Date: 02/23/2018 Diagnostic lumbar 2-3 views Back pain with leg pain radiating left side X-ray shows extensive osteopenia Severe facet joint arthrosis in the 5 4 and S1 segments aorta looks abnormally calcified graph extensive lumbar scoliosis left multiple osteophytes joint space narrowing pedicles are obscured by overall anatomy Severe scoliosis lumbar severe spondylosis Further imaging necessary to to adequately evaluate lumbar spine  Mr Lumbar Spine Wo Contrast  Result Date: 03/01/2018 CLINICAL DATA:  Low back pain extending down both legs, 2 years duration. EXAM: MRI LUMBAR SPINE WITHOUT CONTRAST TECHNIQUE: Multiplanar, multisequence MR imaging of the lumbar spine was performed. No intravenous contrast was administered. COMPARISON:  Radiography 02/23/2018.  CT 03/15/2017. FINDINGS: Segmentation:  5 lumbar type vertebral bodies. Alignment: Curvature convex to the left with the apex at L2-3. 3 mm anterolisthesis L4-5. Vertebrae: Old healed compression deformity at T12 with loss of height of 30%. Recent inferior endplate compression fracture at L3 and superior endplate compression fracture at L4. Loss of height of 40% at L3 and 30% at L4. Old healed minor superior endplate depression at L5. Conus medullaris and cauda equina: Conus extends to the L1 level. Conus and cauda equina appear normal. Paraspinal and other soft tissues: Aortic atherosclerosis. Disc levels: T12-L1: Mild bulging of the disc.  No stenosis. L1-2: Normal. L2-3: Mild bulging of the disc. Mild facet hypertrophy. No stenosis. L3-4: Endplate osteophytes and bulging of the disc. Facet and ligamentous hypertrophy. Moderate multifactorial  stenosis that could cause neural compression on either or both sides. Facet edema left more than right could also contribute to back pain. L4-5: Bilateral facet degeneration. 3 mm of anterolisthesis. Disc degeneration and bulging of the disc. Mild stenosis of the lateral recesses and foramina, left more than right. L5-S1: Mild disc bulge.  Mild facet degeneration.  No stenosis. IMPRESSION: Acute or subacute inferior endplate compression fracture at L3 with loss of height of 40%. Acute or subacute superior endplate fracture at L4 with loss of height of 30%. These look like benign fractures. Moderate multifactorial stenosis at the L3-4  level because of bulging of the disc in combination with facet and ligamentous hypertrophy. Disc degeneration and facet degeneration at L4-5 with 3 mm of anterolisthesis. Mild stenosis of the lateral recesses and foramina left more than right but no definite neural compression. Electronically Signed   By: Paulina Fusi M.D.   On: 03/01/2018 15:22    Labs:  CBC: Recent Labs    07/22/17 1447 02/01/18 1307  WBC 3.6*  --   HGB 13.5 13.3  HCT 40.8 39.0  PLT 132*  --     COAGS: No results for input(s): INR, APTT in the last 8760 hours.  BMP: Recent Labs    03/15/17 0816 07/22/17 1447 02/01/18 1307  NA  --  142 140  K  --  3.5 3.6  CL  --  101 98  CO2  --  28  --   GLUCOSE  --  166* 94  BUN  --  14 12  CALCIUM  --  9.4  --   CREATININE 0.70 0.69 0.70  GFRNONAA  --  >60  --   GFRAA  --  >60  --     LIVER FUNCTION TESTS: Recent Labs    07/22/17 1448  BILITOT 1.1  AST 22  ALT 6*  ALKPHOS 67  PROT 7.8  ALBUMIN 4.2    TUMOR MARKERS: No results for input(s): AFPTM, CEA, CA199, CHROMGRNA in the last 8760 hours.  Assessment and Plan:  Lumbar 3 and lumbar 4 compression fractures. Reviewed imaging with patient and daughter-in-law. Brought to their attention were patient's compression fractures at the levels of lumbar 3 and lumbar 4. Explained that  the best course of management at this time is with a procedure called a kyphoplasty/vertebroplasty. Explained procedure, including risks/benefits. Explained that the indications for this procedure are to stabilize her fractures and decrease her pain level. Patient expresses desire to move forward with procedure.  As patient has no known fall/injury that caused compression fractures, it is recommended that patient receives a bone biopsy during procedure. Will send biopsy request to Dr. Romeo Apple- will inform schedulers.  Plan for follow-up with an image-guided lumbar 3 and lumbar 4 kyphoplasties/vertebroplasties ASAP. Informed patient that our schedulers will call her to set up this imaging scan.  All questions answered and concerns addressed. Patient conveys understanding and agrees with plan.  Thank you for this interesting consult.  I greatly enjoyed meeting KAELIE HENIGAN and look forward to participating in their care.  A copy of this report was sent to the requesting provider on this date.  Electronically Signed: Elwin Mocha, PA-C 03/07/2018, 8:19 AM   I spent a total of 30 Minutes in face to face in clinical consultation, greater than 50% of which was counseling/coordinating care for lumbar 3 and lumbar 4 compression fractures.

## 2018-03-22 ENCOUNTER — Other Ambulatory Visit (HOSPITAL_COMMUNITY): Payer: Self-pay | Admitting: Interventional Radiology

## 2018-03-22 DIAGNOSIS — S32030A Wedge compression fracture of third lumbar vertebra, initial encounter for closed fracture: Secondary | ICD-10-CM

## 2018-03-22 DIAGNOSIS — S32040A Wedge compression fracture of fourth lumbar vertebra, initial encounter for closed fracture: Secondary | ICD-10-CM

## 2018-03-24 ENCOUNTER — Other Ambulatory Visit: Payer: Self-pay | Admitting: Radiology

## 2018-03-25 ENCOUNTER — Encounter (HOSPITAL_COMMUNITY): Payer: Self-pay

## 2018-03-25 ENCOUNTER — Telehealth: Payer: Self-pay | Admitting: Orthopedic Surgery

## 2018-03-25 ENCOUNTER — Ambulatory Visit (HOSPITAL_COMMUNITY)
Admission: RE | Admit: 2018-03-25 | Discharge: 2018-03-25 | Disposition: A | Discharge status: Home / Self Care | Payer: Medicare HMO | Source: Ambulatory Visit | Attending: Interventional Radiology | Admitting: Interventional Radiology

## 2018-03-25 ENCOUNTER — Other Ambulatory Visit (HOSPITAL_COMMUNITY): Payer: Self-pay | Admitting: Interventional Radiology

## 2018-03-25 DIAGNOSIS — F329 Major depressive disorder, single episode, unspecified: Secondary | ICD-10-CM | POA: Insufficient documentation

## 2018-03-25 DIAGNOSIS — X58XXXA Exposure to other specified factors, initial encounter: Secondary | ICD-10-CM | POA: Diagnosis not present

## 2018-03-25 DIAGNOSIS — J449 Chronic obstructive pulmonary disease, unspecified: Secondary | ICD-10-CM | POA: Insufficient documentation

## 2018-03-25 DIAGNOSIS — I110 Hypertensive heart disease with heart failure: Secondary | ICD-10-CM | POA: Insufficient documentation

## 2018-03-25 DIAGNOSIS — S32040A Wedge compression fracture of fourth lumbar vertebra, initial encounter for closed fracture: Secondary | ICD-10-CM | POA: Diagnosis not present

## 2018-03-25 DIAGNOSIS — Z8249 Family history of ischemic heart disease and other diseases of the circulatory system: Secondary | ICD-10-CM | POA: Diagnosis not present

## 2018-03-25 DIAGNOSIS — I5042 Chronic combined systolic (congestive) and diastolic (congestive) heart failure: Secondary | ICD-10-CM | POA: Diagnosis not present

## 2018-03-25 DIAGNOSIS — Z6829 Body mass index (BMI) 29.0-29.9, adult: Secondary | ICD-10-CM | POA: Insufficient documentation

## 2018-03-25 DIAGNOSIS — E46 Unspecified protein-calorie malnutrition: Secondary | ICD-10-CM | POA: Insufficient documentation

## 2018-03-25 DIAGNOSIS — S32030A Wedge compression fracture of third lumbar vertebra, initial encounter for closed fracture: Secondary | ICD-10-CM

## 2018-03-25 DIAGNOSIS — I714 Abdominal aortic aneurysm, without rupture: Secondary | ICD-10-CM | POA: Insufficient documentation

## 2018-03-25 DIAGNOSIS — K219 Gastro-esophageal reflux disease without esophagitis: Secondary | ICD-10-CM | POA: Diagnosis not present

## 2018-03-25 DIAGNOSIS — Z87891 Personal history of nicotine dependence: Secondary | ICD-10-CM | POA: Diagnosis not present

## 2018-03-25 DIAGNOSIS — Z880 Allergy status to penicillin: Secondary | ICD-10-CM | POA: Insufficient documentation

## 2018-03-25 DIAGNOSIS — G2581 Restless legs syndrome: Secondary | ICD-10-CM | POA: Diagnosis not present

## 2018-03-25 DIAGNOSIS — Z886 Allergy status to analgesic agent status: Secondary | ICD-10-CM | POA: Insufficient documentation

## 2018-03-25 DIAGNOSIS — Z9889 Other specified postprocedural states: Secondary | ICD-10-CM | POA: Diagnosis not present

## 2018-03-25 DIAGNOSIS — Z79899 Other long term (current) drug therapy: Secondary | ICD-10-CM | POA: Insufficient documentation

## 2018-03-25 DIAGNOSIS — Z7982 Long term (current) use of aspirin: Secondary | ICD-10-CM | POA: Diagnosis not present

## 2018-03-25 DIAGNOSIS — F419 Anxiety disorder, unspecified: Secondary | ICD-10-CM | POA: Diagnosis not present

## 2018-03-25 DIAGNOSIS — G4700 Insomnia, unspecified: Secondary | ICD-10-CM | POA: Insufficient documentation

## 2018-03-25 DIAGNOSIS — Z930 Tracheostomy status: Secondary | ICD-10-CM | POA: Diagnosis not present

## 2018-03-25 DIAGNOSIS — E785 Hyperlipidemia, unspecified: Secondary | ICD-10-CM | POA: Insufficient documentation

## 2018-03-25 HISTORY — PX: IR KYPHO EA ADDL LEVEL THORACIC OR LUMBAR: IMG5520

## 2018-03-25 HISTORY — PX: IR KYPHO LUMBAR INC FX REDUCE BONE BX UNI/BIL CANNULATION INC/IMAGING: IMG5519

## 2018-03-25 LAB — CBC
HCT: 42.5 % (ref 36.0–46.0)
Hemoglobin: 13.5 g/dL (ref 12.0–15.0)
MCH: 29.8 pg (ref 26.0–34.0)
MCHC: 31.8 g/dL (ref 30.0–36.0)
MCV: 93.8 fL (ref 80.0–100.0)
NRBC: 0 % (ref 0.0–0.2)
PLATELETS: 185 10*3/uL (ref 150–400)
RBC: 4.53 MIL/uL (ref 3.87–5.11)
RDW: 13.7 % (ref 11.5–15.5)
WBC: 5.8 10*3/uL (ref 4.0–10.5)

## 2018-03-25 LAB — PROTIME-INR
INR: 0.96
PROTHROMBIN TIME: 12.7 s (ref 11.4–15.2)

## 2018-03-25 LAB — BASIC METABOLIC PANEL
Anion gap: 9 (ref 5–15)
BUN: 14 mg/dL (ref 8–23)
CO2: 31 mmol/L (ref 22–32)
Calcium: 9.6 mg/dL (ref 8.9–10.3)
Chloride: 102 mmol/L (ref 98–111)
Creatinine, Ser: 0.63 mg/dL (ref 0.44–1.00)
GFR calc Af Amer: >60 mL/min (ref >60)
GFR calc non Af Amer: >60 mL/min (ref >60)
GLUCOSE: 101 mg/dL — AB (ref 70–99)
POTASSIUM: 4.6 mmol/L (ref 3.5–5.1)
SODIUM: 142 mmol/L (ref 135–145)

## 2018-03-25 MED ORDER — IOPAMIDOL (ISOVUE-300) INJECTION 61%
INTRAVENOUS | Status: AC
  Administered 2018-03-25: 30 mL
  Filled 2018-03-25: qty 50

## 2018-03-25 MED ORDER — BUPIVACAINE HCL (PF) 0.5 % IJ SOLN
INTRAMUSCULAR | Status: AC | PRN
  Administered 2018-03-25: 45 mL

## 2018-03-25 MED ORDER — BUPIVACAINE HCL (PF) 0.5 % IJ SOLN
INTRAMUSCULAR | Status: AC
  Filled 2018-03-25: qty 30

## 2018-03-25 MED ORDER — HYDROMORPHONE HCL 1 MG/ML IJ SOLN
INTRAMUSCULAR | Status: AC | PRN
  Administered 2018-03-25: 1 mg via INTRAVENOUS

## 2018-03-25 MED ORDER — MIDAZOLAM HCL 2 MG/2ML IJ SOLN
INTRAMUSCULAR | Status: AC
  Filled 2018-03-25: qty 4

## 2018-03-25 MED ORDER — VANCOMYCIN HCL IN DEXTROSE 1-5 GM/200ML-% IV SOLN
1000.0000 mg | INTRAVENOUS | Status: AC
  Administered 2018-03-25: 1000 mg via INTRAVENOUS

## 2018-03-25 MED ORDER — TOBRAMYCIN SULFATE 1.2 G IJ SOLR
INTRAMUSCULAR | Status: AC
  Filled 2018-03-25: qty 1.2

## 2018-03-25 MED ORDER — FENTANYL CITRATE (PF) 100 MCG/2ML IJ SOLN
INTRAMUSCULAR | Status: AC
  Filled 2018-03-25: qty 4

## 2018-03-25 MED ORDER — TOBRAMYCIN SULFATE 1.2 G IJ SOLR
INTRAMUSCULAR | Status: AC | PRN
  Administered 2018-03-25: .01 g via TOPICAL

## 2018-03-25 MED ORDER — VANCOMYCIN HCL IN DEXTROSE 1-5 GM/200ML-% IV SOLN
INTRAVENOUS | Status: AC
  Administered 2018-03-25: 1000 mg via INTRAVENOUS
  Filled 2018-03-25: qty 200

## 2018-03-25 MED ORDER — GELATIN ABSORBABLE 12-7 MM EX MISC
CUTANEOUS | Status: AC
  Filled 2018-03-25: qty 1

## 2018-03-25 MED ORDER — MIDAZOLAM HCL 2 MG/2ML IJ SOLN
INTRAMUSCULAR | Status: AC | PRN
  Administered 2018-03-25: 0.5 mg via INTRAVENOUS
  Administered 2018-03-25 (×2): 1 mg via INTRAVENOUS
  Administered 2018-03-25: 0.5 mg via INTRAVENOUS

## 2018-03-25 MED ORDER — FENTANYL CITRATE (PF) 100 MCG/2ML IJ SOLN
INTRAMUSCULAR | Status: AC | PRN
  Administered 2018-03-25 (×2): 25 ug via INTRAVENOUS
  Administered 2018-03-25 (×2): 12.5 ug via INTRAVENOUS

## 2018-03-25 MED ORDER — HYDROMORPHONE HCL 1 MG/ML IJ SOLN
INTRAMUSCULAR | Status: AC
  Filled 2018-03-25: qty 1

## 2018-03-25 MED ORDER — SODIUM CHLORIDE 0.9 % IV SOLN: INTRAVENOUS | Status: DC

## 2018-03-25 NOTE — Sedation Documentation (Signed)
Patient is resting comfortably. 

## 2018-03-25 NOTE — Sedation Documentation (Signed)
dsg low back intact  

## 2018-03-25 NOTE — Discharge Instructions (Addendum)
° °  1. No stooping,bending orlifting more than 10 lbs for 2 weeks. 2.Use walker to ambulate for 2 weeks.  3.No driving for 2 weeks  4.RTC PRN 2  to3 weeks  KYPHOPLASTY/VERTEBROPLASTY DISCHARGE INSTRUCTIONS  Medications: (check all that apply)     Resume all home medications as before procedure.                     Continue your pain medications as prescribed as needed.  Over the next 3-5 days, decrease your pain medication as tolerated.  Over the counter medications (i.e. Tylenol, ibuprofen, and aleve) may be substituted once severe/moderate pain symptoms have subsided.   Wound Care: - Bandages may be removed the day following your procedure.  You may get your incision wet once bandages are removed.  Bandaids may be used to cover the incisions until scab formation.  Topical ointments are optional.  - If you develop a fever greater than 101 degrees, have increased skin redness at the incision sites or pus-like oozing from incisions occurring within 1 week of the procedure, contact your ordering physician   - Ice pack to back for 15-20 minutes 2-3 time per day for first 2-3 days post procedure.  The ice will expedite muscle healing and help with the pain from the incisions.   Activity: - Bedrest today with limited activity for 24 hours post procedure.  - No driving for 48 hours.  - Increase your activity as tolerated after bedrest (with assistance if necessary).  - Refrain from any strenuous activity or heavy lifting (greater than 10 lbs.).   Follow up:   - If a biopsy was performed at the time of your procedure, your referring physician should receive the results in usually 2-3 days.

## 2018-03-25 NOTE — Telephone Encounter (Signed)
Morrie Sheldon from Dr. Fatima Sanger office called about paperwork faxed to Dr. Romeo Apple about a bone biopsy for this patient. Morrie Sheldon stated the patient was there in the office and would like to know if Dr. Romeo Apple wanted it done or not. I asked her to hold and I called Amy at home (she was off today) to find out what needs to be done and she stated that Dr. Romeo Apple did not want the biopsy done. I returned to [place] on the phone and told her that I had spoken to Amy and relayed what Amy had told me over the phone. Morrie Sheldon stated she would let Dr. Corliss Skains know.

## 2018-03-25 NOTE — Procedures (Signed)
S/P L3 and L4 balloon KP

## 2018-03-25 NOTE — H&P (Signed)
Chief Complaint: Patient was seen in consultation today for Lumbar 3 and 4 kyphoplasty at the request of Dr Fuller Canada   Supervising Physician: Julieanne Cotton  Patient Status: [facility] - Out-pt  History of Present Illness: Briana Dean is a 77 y.o. female   Started with LBP 12/2017 -- no injury Pain meds no help To ED 9/3 and to Dr Mort Sawyers 9/25  MRI lumbar spine 03/01/2018: 1. Acute or subacute inferior endplate compression fracture at L3 with loss of height of 40%. Acute or subacute superior endplate fracture at L4 with loss of height of 30%. These look like benign fractures. 2. Moderate multifactorial stenosis at the L3-4 level because of bulging of the disc in combination with facet and ligamentous hypertrophy. 3. Disc degeneration and facet degeneration at L4-5 with 3 mm of anterolisthesis. Mild stenosis of the lateral recesses and foramina left more than right but no definite neural compression.  Consultation with Dr Corliss Skains 03/07/18 Now scheduled for L3 and 4 Kyphoplasty    Past Medical History:  Diagnosis Date  . AAA (abdominal aortic aneurysm) (HCC)   . Acute on chronic respiratory failure with hypoxia (HCC)   . Anemia, unspecified   . Aneurysm of thoracic aorta (HCC)   . Anxiety disorder   . Chronic combined systolic and diastolic heart failure (HCC)   . Constipation   . COPD (chronic obstructive pulmonary disease) (HCC)   . Dissection of distal aorta (HCC)   . Gastroesophageal reflux disease without esophagitis   . Hyperlipidemia, unspecified   . Hypertension, essential   . Insomnia, unspecified   . Low back pain   . Major depressive disorder, single episode, unspecified   . PAD (peripheral artery disease) (HCC)   . Respiratory failure (HCC)   . Restless leg syndrome   . Unspecified protein-calorie malnutrition (HCC)   . Weakness     Past Surgical History:  Procedure Laterality Date  . THORACIC AORTIC ANEURYSM REPAIR  09/06/2016   TEVAR  .  TRACHEOSTOMY  08/2016  . VIDEO ASSISTED THORACOSCOPY (VATS)/THOROCOTOMY  08/2016    Allergies: Aspirin and Penicillins  Medications: Prior to Admission medications   Medication Sig Start Date End Date Taking? Authorizing Provider  aspirin EC 81 MG tablet Take 81 mg by mouth daily.   Yes [provider]  atenolol (TENORMIN) 25 MG tablet Take 25 mg by mouth daily.   Yes [provider]  atorvastatin (LIPITOR) 40 MG tablet Take 40 mg by mouth daily.   Yes [provider]  carbidopa-levodopa (SINEMET IR) 25-100 MG tablet Take 1 tablet by mouth daily.   Yes [provider]  ergocalciferol (VITAMIN D2) 50000 units capsule Take 50,000 Units by mouth once a week.   Yes [provider]  famotidine (PEPCID) 20 MG tablet Take 20 mg by mouth daily.   Yes [provider]  ferrous sulfate 324 (65 Fe) MG TBEC Take by mouth daily.   Yes [provider]  furosemide (LASIX) 20 MG tablet Take 20 mg by mouth daily.   Yes [provider]  Ipratropium-Albuterol (DUONEB IN) Inhale into the lungs 3 (three) times daily.   Yes [provider]  lisinopril (PRINIVIL,ZESTRIL) 10 MG tablet TAKE 1 TABLET BY MOUTH ONCE DAILY 12/06/17  Yes Laqueta Linden, MD  Nutritional Supplements (RA MELATONIN/B-6 PO) Take 1 tablet by mouth at bedtime.   Yes [provider]  PARoxetine (PAXIL) 20 MG tablet Take 20 mg by mouth daily.   Yes [provider]  polyethylene glycol (MIRALAX / GLYCOLAX) packet Take 17 g by mouth daily as needed for mild constipation.    Yes [provider]  rOPINIRole (REQUIP) 0.5 MG tablet Take 0.5 mg by mouth at bedtime. 2 tabs daily   Yes [provider]  traMADol (ULTRAM) 50 MG tablet Take by mouth every 6 (six) hours as needed.   Yes [provider]     Family History  Problem Relation Age of Onset  . Congenital heart disease Mother   . Heart disease Mother   . Peripheral  vascular disease Father   . Alcoholism Father   . Pectus carinatum Son   . Heart failure Son   . Heart attack Son 32  . COPD Sister     Social History   Socioeconomic History  . Marital status: Single    Spouse name: Not on file  . Number of children: Not on file  . Years of education: Not on file  . Highest education level: Not on file  Occupational History  . Not on file  Social Needs  . Financial resource strain: Not on file  . Food insecurity:    Worry: Not on file    Inability: Not on file  . Transportation needs:    Medical: Not on file    Non-medical: Not on file  Tobacco Use  . Smoking status: Former Smoker    Packs/day: 1.00    Years: 60.00    Pack years: 60.00    Types: Cigarettes    Last attempt to quit: 08/30/2016    Years since quitting: 1.5  . Smokeless tobacco: Never Used  Substance and Sexual Activity  . Alcohol use: No  . Drug use: No  . Sexual activity: Not on file  Lifestyle  . Physical activity:    Days per week: Not on file    Minutes per session: Not on file  . Stress: Not on file  Relationships  . Social connections:    Talks on phone: Not on file    Gets together: Not on file    Attends religious service: Not on file    Active member of club or organization: Not on file    Attends meetings of clubs or organizations: Not on file    Relationship status: Not on file  Other Topics Concern  . Not on file  Social History Narrative  . Not on file    Review of Systems: A 12 point ROS discussed and pertinent positives are indicated in the HPI above.  All other systems are negative.  Review of Systems  Constitutional: Positive for activity change. Negative for fatigue and fever.  Respiratory: Negative for shortness of breath.   Cardiovascular: Negative for chest pain.  Gastrointestinal: Negative for abdominal pain.  Musculoskeletal: Positive for back pain and gait problem.  Neurological: Positive for weakness.  Psychiatric/Behavioral:  Negative for behavioral problems and confusion.    Vital Signs: BP (!) 171/102   Pulse 70   Temp 97.6 F (36.4 C) (Oral)   Resp 18   Ht 5\' 6"  (1.676 m)   Wt 180 lb (81.6 kg)   LMP  (LMP Unknown)   SpO2 98%   BMI 29.05 kg/m   Physical Exam  Constitutional: She is oriented to person, place, and time.  Cardiovascular: Normal rate, regular rhythm and normal heart sounds.  Pulmonary/Chest: Effort normal and breath sounds normal.  Abdominal: Soft. Bowel sounds are normal.  Musculoskeletal: Normal range of motion.  Low back pain  Neurological: She is alert and oriented to person, place, and time.  Skin: Skin is warm and dry.  Psychiatric: She has a normal mood and affect. Her behavior is normal. Judgment and thought content normal.  Vitals reviewed.   Imaging: Dg Lumbar Spine 2-3 Views  Result Date: 02/23/2018 Diagnostic lumbar 2-3 views Back pain with leg pain radiating left side X-ray shows extensive osteopenia Severe facet joint arthrosis in the 5 4 and S1 segments aorta looks abnormally calcified graph extensive lumbar scoliosis left multiple osteophytes joint space narrowing pedicles are obscured by overall anatomy Severe scoliosis lumbar severe spondylosis Further imaging necessary to to adequately evaluate lumbar spine  Mr Lumbar Spine Wo Contrast  Result Date: 03/01/2018 CLINICAL DATA:  Low back pain extending down both legs, 2 years duration. EXAM: MRI LUMBAR SPINE WITHOUT CONTRAST TECHNIQUE: Multiplanar, multisequence MR imaging of the lumbar spine was performed. No intravenous contrast was administered. COMPARISON:  Radiography 02/23/2018.  CT 03/15/2017. FINDINGS: Segmentation:  5 lumbar type vertebral bodies. Alignment: Curvature convex to the left with the apex at L2-3. 3 mm anterolisthesis L4-5. Vertebrae: Old healed compression deformity at T12 with loss of height of 30%. Recent inferior endplate compression fracture at L3 and superior endplate compression fracture at L4.  Loss of height of 40% at L3 and 30% at L4. Old healed minor superior endplate depression at L5. Conus medullaris and cauda equina: Conus extends to the L1 level. Conus and cauda equina appear normal. Paraspinal and other soft tissues: Aortic atherosclerosis. Disc levels: T12-L1: Mild bulging of the disc.  No stenosis. L1-2: Normal. L2-3: Mild bulging of the disc. Mild facet hypertrophy. No stenosis. L3-4: Endplate osteophytes and bulging of the disc. Facet and ligamentous hypertrophy. Moderate multifactorial stenosis that could cause neural compression on either or both sides. Facet edema left more than right could also contribute to back pain. L4-5: Bilateral facet degeneration. 3 mm of anterolisthesis. Disc degeneration and bulging of the disc. Mild stenosis of the lateral recesses and foramina, left more than right. L5-S1: Mild disc bulge.  Mild facet degeneration.  No stenosis. IMPRESSION: Acute or subacute inferior endplate compression fracture at L3 with loss of height of 40%. Acute or subacute superior endplate fracture at L4 with loss of height of 30%. These look like benign fractures. Moderate multifactorial stenosis at the L3-4 level because of bulging of the disc in combination with facet and ligamentous hypertrophy. Disc degeneration and facet degeneration at L4-5 with 3 mm of anterolisthesis. Mild stenosis of the lateral recesses and foramina left more than right but no definite neural compression. Electronically Signed   By: Paulina Fusi M.D.   On: 03/01/2018 15:22    Labs:  CBC: Recent Labs    07/22/17 1447 02/01/18 1307 03/25/18 0614  WBC 3.6*  --  5.8  HGB 13.5 13.3 13.5  HCT 40.8 39.0 42.5  PLT 132*  --  185    COAGS: No results for input(s): INR, APTT in the last 8760 hours.  BMP: Recent Labs    07/22/17 1447 02/01/18 1307  NA 142 140  K 3.5 3.6  CL 101 98  CO2 28  --   GLUCOSE 166* 94  BUN 14 12  CALCIUM 9.4  --   CREATININE 0.69 0.70  GFRNONAA >60  --   GFRAA  >60  --     LIVER FUNCTION TESTS: Recent Labs    07/22/17 1448  BILITOT 1.1  AST 22  ALT 6*  ALKPHOS 67  PROT 7.8  ALBUMIN 4.2    TUMOR MARKERS: No results for input(s): AFPTM, CEA, CA199, CHROMGRNA in the last 8760 hours.  Assessment and Plan:  Low back pain without injury Pain meds without relief L3-4 painful acute fx per MRI Scheduled now for L3 and 4 Kyphoplasty Risks and benefits of L3-4 KP were discussed with the patient including, but not limited to education regarding the natural healing process of compression fractures without intervention, bleeding, infection, cement migration which may cause spinal cord damage, paralysis, pulmonary embolism or even death.  This interventional procedure involves the use of X-rays and because of the nature of the planned procedure, it is possible that we will have prolonged use of X-ray fluoroscopy.  Potential radiation risks to you include (but are not limited to) the following: - A slightly elevated risk for cancer  several years later in life. This risk is typically less than 0.5% percent. This risk is low in comparison to the normal incidence of human cancer, which is 33% for women and 50% for men according to the [employer] Society. - Radiation induced injury can include skin redness, resembling a rash, tissue breakdown / ulcers and hair loss (which can be temporary or permanent).   The likelihood of either of these occurring depends on the difficulty of the procedure and whether you are sensitive to radiation due to previous procedures, disease, or genetic conditions.   IF your procedure requires a prolonged use of radiation, you will be notified and given written instructions for further action.  It is your responsibility to monitor the irradiated area for the 2 weeks following the procedure and to notify your physician if you are concerned that you have suffered a radiation induced injury.    All of the patient's questions  were answered, patient is agreeable to proceed.  Consent signed and in chart.  Thank you for this interesting consult.  I greatly enjoyed meeting LENORE MOYANO and look forward to participating in their care.  A copy of this report was sent to the requesting provider on this date.  Electronically Signed: Robet Leu, PA-C 03/25/2018, 7:12 AM   I spent a total of  30 Minutes   in face to face in clinical consultation, greater than 50% of which was counseling/coordinating care for L3 and 4 KP

## 2018-03-26 ENCOUNTER — Encounter (HOSPITAL_COMMUNITY): Payer: Self-pay | Admitting: Interventional Radiology

## 2018-07-27 ENCOUNTER — Ambulatory Visit
Admission: RE | Admit: 2018-07-27 | Discharge: 2018-07-27 | Disposition: A | Discharge status: Home / Self Care | Payer: Medicare HMO | Source: Ambulatory Visit | Attending: Vascular Surgery | Admitting: Vascular Surgery

## 2018-07-27 DIAGNOSIS — I716 Thoracoabdominal aortic aneurysm, without rupture, unspecified: Secondary | ICD-10-CM

## 2018-07-27 MED ORDER — IOPAMIDOL (ISOVUE-370) INJECTION 76%
75.0000 mL | Freq: Once | INTRAVENOUS | Status: AC | PRN
  Administered 2018-07-27: 75 mL via INTRAVENOUS

## 2018-08-02 ENCOUNTER — Other Ambulatory Visit: Payer: Self-pay

## 2018-08-02 ENCOUNTER — Encounter: Payer: Self-pay | Admitting: Vascular Surgery

## 2018-08-02 ENCOUNTER — Ambulatory Visit (INDEPENDENT_AMBULATORY_CARE_PROVIDER_SITE_OTHER): Payer: Medicare HMO | Admitting: Vascular Surgery

## 2018-08-02 VITALS — BP 161/83 | HR 80 | Temp 98.0°F | Resp 18 | Ht 66.0 in | Wt 183.8 lb

## 2018-08-02 DIAGNOSIS — I711 Thoracic aortic aneurysm, ruptured, unspecified: Secondary | ICD-10-CM

## 2018-08-02 NOTE — Progress Notes (Signed)
[facility] [place]  Patient name: Briana Dean MRN: [id number] DOB: Oct 06, 1940 Sex: female  REASON FOR VISIT: Follow-up stent graft repair of thoracic dissection  HPI: Briana Dean is a 78 y.o. female here today for follow-up.  She has difficulty with degenerative disc disease and is undergone kyphoplasty.  Otherwise no new major medical difficulties.  No symptoms referable to her aneurysm.  Past Medical History:  Diagnosis Date  . AAA (abdominal aortic aneurysm) (HCC)   . Acute on chronic respiratory failure with hypoxia (HCC)   . Anemia, unspecified   . Aneurysm of thoracic aorta (HCC)   . Anxiety disorder   . Chronic combined systolic and diastolic heart failure (HCC)   . Constipation   . COPD (chronic obstructive pulmonary disease) (HCC)   . Dissection of distal aorta (HCC)   . Gastroesophageal reflux disease without esophagitis   . Hyperlipidemia, unspecified   . Hypertension, essential   . Insomnia, unspecified   . Low back pain   . Major depressive disorder, single episode, unspecified   . PAD (peripheral artery disease) (HCC)   . Respiratory failure (HCC)   . Restless leg syndrome   . Unspecified protein-calorie malnutrition (HCC)   . Weakness     Family History  Problem Relation Age of Onset  . Congenital heart disease Mother   . Heart disease Mother   . Peripheral vascular disease Father   . Alcoholism Father   . Pectus carinatum Son   . Heart failure Son   . Heart attack Son 57  . COPD Sister     SOCIAL HISTORY: Social History   Tobacco Use  . Smoking status: Former Smoker    Packs/day: 1.00    Years: 60.00    Pack years: 60.00    Types: Cigarettes    Last attempt to quit: 08/30/2016    Years since quitting: 1.9  . Smokeless tobacco: Never Used  Substance Use Topics  . Alcohol use: No    Allergies  Allergen Reactions  . Aspirin Other (See Comments)    Stomach pain  . Penicillins       Has patient had a PCN reaction causing immediate rash, facial/tongue/throat swelling, SOB or lightheadedness with hypotension No Has patient had a PCN reaction causing severe rash involving mucus membranes or skin necrosis: No Has patient had a PCN reaction that required hospitalization: No Has patient had a PCN reaction occurring within the last 10 years: No If all of the above answers are "NO", then may proceed with Cephalosporin use.     Current Outpatient Medications  Medication Sig Dispense Refill  . aspirin EC 81 MG tablet Take 81 mg by mouth daily.    Marland Kitchen atenolol (TENORMIN) 25 MG tablet Take 25 mg by mouth daily.    Marland Kitchen atorvastatin (LIPITOR) 40 MG tablet Take 40 mg by mouth daily.    . carbidopa-levodopa (SINEMET IR) 25-100 MG tablet Take 1 tablet by mouth daily.    . ergocalciferol (VITAMIN D2) 50000 units capsule Take 50,000 Units by mouth once a week.    . famotidine (PEPCID) 20 MG tablet Take 20 mg by mouth daily.    . ferrous sulfate 324 (65 Fe) MG TBEC Take by mouth daily.    . furosemide (LASIX) 20 MG tablet Take 20 mg by mouth daily.    . Ipratropium-Albuterol (DUONEB IN) Inhale into the lungs 3 (three) times daily.    Marland Kitchen lisinopril (PRINIVIL,ZESTRIL) 10 MG tablet TAKE 1  TABLET BY MOUTH ONCE DAILY 30 tablet 0  . Nutritional Supplements (RA MELATONIN/B-6 PO) Take 1 tablet by mouth at bedtime.    Marland Kitchen PARoxetine (PAXIL) 20 MG tablet Take 20 mg by mouth daily.    . polyethylene glycol (MIRALAX / GLYCOLAX) packet Take 17 g by mouth daily as needed for mild constipation.     Marland Kitchen rOPINIRole (REQUIP) 0.5 MG tablet Take 0.5 mg by mouth at bedtime. 2 tabs daily    . traMADol (ULTRAM) 50 MG tablet Take by mouth every 6 (six) hours as needed.     No current facility-administered medications for this visit.     REVIEW OF SYSTEMS:  [X]  denotes positive finding, [ ]  denotes negative finding Cardiac  Comments:  Chest pain or chest pressure:    Shortness of breath upon exertion: x    Short of breath when lying flat:    Irregular heart rhythm:        Vascular    Pain in calf, thigh, or hip brought on by ambulation: x   Pain in feet at night that wakes you up from your sleep:     Blood clot in your veins:    Leg swelling:  x         PHYSICAL EXAM: Vitals:   08/02/18 1514  BP: (!) 161/83  Pulse: 80  Resp: 18  Temp: 98 F (36.7 C)  SpO2: 95%  Weight: 183 lb 12.1 oz (83.3 kg)  Height: 5\' 6"  (1.676 m)    GENERAL: The patient is a well-nourished female, in no acute distress. The vital signs are documented above. CARDIOVASCULAR: Palpable right radial pulse.  Absent left radial pulse.  Moderate pitting edema in both lower extremities PULMONARY: There is good air exchange  MUSCULOSKELETAL: There are no major deformities or cyanosis. NEUROLOGIC: No focal weakness or paresthesias are detected. SKIN: There are no ulcers or rashes noted. PSYCHIATRIC: The patient has a normal affect.  DATA:  CT scan from 07/27/2018 were reviewed.  This shows no new difficulty regarding her stent graft repair of thoracic aneurysm.  This extends it onto the origin of her left carotid and covers her left subclavian takeoff.  No change in diameter.  MEDICAL ISSUES: Discussed these findings with the patient and her family present.  Recommend repeat CT scan in 1 year.  She will notify should she develop any malperfusion.  Her initial emergent surgery was in [facility]    Larina Earthly, MD [facility] Tel [phone] Pager [phone]

## 2018-08-03 ENCOUNTER — Ambulatory Visit: Payer: Medicare HMO | Admitting: Vascular Surgery

## 2018-10-22 ENCOUNTER — Emergency Department (HOSPITAL_COMMUNITY): Payer: Medicare HMO

## 2018-10-22 ENCOUNTER — Other Ambulatory Visit: Payer: Self-pay

## 2018-10-22 ENCOUNTER — Inpatient Hospital Stay (HOSPITAL_COMMUNITY)
Admission: EM | Admit: 2018-10-22 | Discharge: 2018-10-28 | DRG: 871 | Disposition: A | Discharge status: Skilled Nursing Facility | Payer: Medicare HMO | Attending: Family Medicine | Admitting: Family Medicine

## 2018-10-22 ENCOUNTER — Encounter (HOSPITAL_COMMUNITY): Payer: Self-pay | Admitting: Emergency Medicine

## 2018-10-22 DIAGNOSIS — I340 Nonrheumatic mitral (valve) insufficiency: Secondary | ICD-10-CM | POA: Diagnosis not present

## 2018-10-22 DIAGNOSIS — Z7982 Long term (current) use of aspirin: Secondary | ICD-10-CM | POA: Diagnosis not present

## 2018-10-22 DIAGNOSIS — A4101 Sepsis due to Methicillin susceptible Staphylococcus aureus: Secondary | ICD-10-CM | POA: Diagnosis present

## 2018-10-22 DIAGNOSIS — G4700 Insomnia, unspecified: Secondary | ICD-10-CM | POA: Diagnosis present

## 2018-10-22 DIAGNOSIS — Z79899 Other long term (current) drug therapy: Secondary | ICD-10-CM

## 2018-10-22 DIAGNOSIS — I10 Essential (primary) hypertension: Secondary | ICD-10-CM | POA: Diagnosis present

## 2018-10-22 DIAGNOSIS — Z8249 Family history of ischemic heart disease and other diseases of the circulatory system: Secondary | ICD-10-CM | POA: Diagnosis not present

## 2018-10-22 DIAGNOSIS — I5042 Chronic combined systolic (congestive) and diastolic (congestive) heart failure: Secondary | ICD-10-CM | POA: Diagnosis present

## 2018-10-22 DIAGNOSIS — M5441 Lumbago with sciatica, right side: Secondary | ICD-10-CM | POA: Diagnosis present

## 2018-10-22 DIAGNOSIS — E785 Hyperlipidemia, unspecified: Secondary | ICD-10-CM | POA: Diagnosis present

## 2018-10-22 DIAGNOSIS — R52 Pain, unspecified: Secondary | ICD-10-CM

## 2018-10-22 DIAGNOSIS — R0902 Hypoxemia: Secondary | ICD-10-CM

## 2018-10-22 DIAGNOSIS — I110 Hypertensive heart disease with heart failure: Secondary | ICD-10-CM | POA: Diagnosis present

## 2018-10-22 DIAGNOSIS — F0391 Unspecified dementia with behavioral disturbance: Secondary | ICD-10-CM | POA: Diagnosis present

## 2018-10-22 DIAGNOSIS — I714 Abdominal aortic aneurysm, without rupture: Secondary | ICD-10-CM | POA: Diagnosis present

## 2018-10-22 DIAGNOSIS — B9561 Methicillin susceptible Staphylococcus aureus infection as the cause of diseases classified elsewhere: Secondary | ICD-10-CM | POA: Diagnosis present

## 2018-10-22 DIAGNOSIS — A419 Sepsis, unspecified organism: Secondary | ICD-10-CM

## 2018-10-22 DIAGNOSIS — Z87891 Personal history of nicotine dependence: Secondary | ICD-10-CM

## 2018-10-22 DIAGNOSIS — J9621 Acute and chronic respiratory failure with hypoxia: Secondary | ICD-10-CM | POA: Diagnosis present

## 2018-10-22 DIAGNOSIS — M5442 Lumbago with sciatica, left side: Secondary | ICD-10-CM | POA: Diagnosis present

## 2018-10-22 DIAGNOSIS — I371 Nonrheumatic pulmonary valve insufficiency: Secondary | ICD-10-CM | POA: Diagnosis not present

## 2018-10-22 DIAGNOSIS — G2581 Restless legs syndrome: Secondary | ICD-10-CM | POA: Diagnosis present

## 2018-10-22 DIAGNOSIS — I739 Peripheral vascular disease, unspecified: Secondary | ICD-10-CM | POA: Diagnosis present

## 2018-10-22 DIAGNOSIS — J449 Chronic obstructive pulmonary disease, unspecified: Secondary | ICD-10-CM | POA: Diagnosis present

## 2018-10-22 DIAGNOSIS — Z880 Allergy status to penicillin: Secondary | ICD-10-CM

## 2018-10-22 DIAGNOSIS — M4856XA Collapsed vertebra, not elsewhere classified, lumbar region, initial encounter for fracture: Secondary | ICD-10-CM | POA: Diagnosis present

## 2018-10-22 DIAGNOSIS — Z79891 Long term (current) use of opiate analgesic: Secondary | ICD-10-CM

## 2018-10-22 DIAGNOSIS — Z825 Family history of asthma and other chronic lower respiratory diseases: Secondary | ICD-10-CM | POA: Diagnosis not present

## 2018-10-22 DIAGNOSIS — G8929 Other chronic pain: Secondary | ICD-10-CM | POA: Diagnosis present

## 2018-10-22 DIAGNOSIS — E876 Hypokalemia: Secondary | ICD-10-CM | POA: Diagnosis present

## 2018-10-22 DIAGNOSIS — I509 Heart failure, unspecified: Secondary | ICD-10-CM

## 2018-10-22 DIAGNOSIS — F419 Anxiety disorder, unspecified: Secondary | ICD-10-CM | POA: Diagnosis present

## 2018-10-22 DIAGNOSIS — I711 Thoracic aortic aneurysm, ruptured, unspecified: Secondary | ICD-10-CM | POA: Diagnosis present

## 2018-10-22 DIAGNOSIS — I361 Nonrheumatic tricuspid (valve) insufficiency: Secondary | ICD-10-CM | POA: Diagnosis not present

## 2018-10-22 DIAGNOSIS — R7881 Bacteremia: Secondary | ICD-10-CM | POA: Diagnosis not present

## 2018-10-22 DIAGNOSIS — Z20828 Contact with and (suspected) exposure to other viral communicable diseases: Secondary | ICD-10-CM | POA: Diagnosis present

## 2018-10-22 DIAGNOSIS — R509 Fever, unspecified: Secondary | ICD-10-CM | POA: Diagnosis present

## 2018-10-22 DIAGNOSIS — M25552 Pain in left hip: Secondary | ICD-10-CM | POA: Diagnosis not present

## 2018-10-22 DIAGNOSIS — K219 Gastro-esophageal reflux disease without esophagitis: Secondary | ICD-10-CM | POA: Diagnosis present

## 2018-10-22 DIAGNOSIS — Z20822 Contact with and (suspected) exposure to covid-19: Secondary | ICD-10-CM

## 2018-10-22 DIAGNOSIS — Z886 Allergy status to analgesic agent status: Secondary | ICD-10-CM

## 2018-10-22 LAB — URINALYSIS, ROUTINE W REFLEX MICROSCOPIC
Bilirubin Urine: NEGATIVE
Glucose, UA: NEGATIVE mg/dL
Hgb urine dipstick: NEGATIVE
Ketones, ur: NEGATIVE mg/dL
Leukocytes,Ua: NEGATIVE
Nitrite: NEGATIVE
Protein, ur: NEGATIVE mg/dL
Specific Gravity, Urine: 1.008 (ref 1.005–1.030)
pH: 8 (ref 5.0–8.0)

## 2018-10-22 LAB — CBC WITH DIFFERENTIAL/PLATELET
Abs Immature Granulocytes: 0.05 10*3/uL (ref 0.00–0.07)
Basophils Absolute: 0 10*3/uL (ref 0.0–0.1)
Basophils Relative: 0 %
Eosinophils Absolute: 0 10*3/uL (ref 0.0–0.5)
Eosinophils Relative: 0 %
HCT: 35.2 % — ABNORMAL LOW (ref 36.0–46.0)
Hemoglobin: 11.5 g/dL — ABNORMAL LOW (ref 12.0–15.0)
Immature Granulocytes: 1 %
Lymphocytes Relative: 4 %
Lymphs Abs: 0.4 10*3/uL — ABNORMAL LOW (ref 0.7–4.0)
MCH: 30.7 pg (ref 26.0–34.0)
MCHC: 32.7 g/dL (ref 30.0–36.0)
MCV: 93.9 fL (ref 80.0–100.0)
Monocytes Absolute: 1.2 10*3/uL — ABNORMAL HIGH (ref 0.1–1.0)
Monocytes Relative: 12 %
Neutro Abs: 8.5 10*3/uL — ABNORMAL HIGH (ref 1.7–7.7)
Neutrophils Relative %: 83 %
Platelets: 142 10*3/uL — ABNORMAL LOW (ref 150–400)
RBC: 3.75 MIL/uL — ABNORMAL LOW (ref 3.87–5.11)
RDW: 15.1 % (ref 11.5–15.5)
WBC: 10.1 10*3/uL (ref 4.0–10.5)
nRBC: 0 % (ref 0.0–0.2)

## 2018-10-22 LAB — SARS CORONAVIRUS 2 BY RT PCR (HOSPITAL ORDER, PERFORMED IN ~~LOC~~ HOSPITAL LAB): SARS Coronavirus 2: NEGATIVE

## 2018-10-22 LAB — COMPREHENSIVE METABOLIC PANEL
ALT: <5 U/L (ref 0–44)
AST: 11 U/L — ABNORMAL LOW (ref 15–41)
Albumin: 3.6 g/dL (ref 3.5–5.0)
Alkaline Phosphatase: 69 U/L (ref 38–126)
Anion gap: 12 (ref 5–15)
BUN: 11 mg/dL (ref 8–23)
CO2: 27 mmol/L (ref 22–32)
Calcium: 8.7 mg/dL — ABNORMAL LOW (ref 8.9–10.3)
Chloride: 98 mmol/L (ref 98–111)
Creatinine, Ser: 0.61 mg/dL (ref 0.44–1.00)
GFR calc Af Amer: >60 mL/min (ref >60)
GFR calc non Af Amer: >60 mL/min (ref >60)
Glucose, Bld: 122 mg/dL — ABNORMAL HIGH (ref 70–99)
Potassium: 3.1 mmol/L — ABNORMAL LOW (ref 3.5–5.1)
Sodium: 137 mmol/L (ref 135–145)
Total Bilirubin: 1.5 mg/dL — ABNORMAL HIGH (ref 0.3–1.2)
Total Protein: 7.2 g/dL (ref 6.5–8.1)

## 2018-10-22 LAB — C-REACTIVE PROTEIN: CRP: 13.8 mg/dL — ABNORMAL HIGH (ref <1.0)

## 2018-10-22 LAB — LACTIC ACID, PLASMA
Lactic Acid, Venous: 0.8 mmol/L (ref 0.5–1.9)
Lactic Acid, Venous: 0.6 mmol/L (ref 0.5–1.9)

## 2018-10-22 LAB — TROPONIN I: Troponin I: <0.03 ng/mL (ref <0.03)

## 2018-10-22 LAB — SEDIMENTATION RATE: Sed Rate: 50 mm/hr — ABNORMAL HIGH (ref 0–22)

## 2018-10-22 LAB — BRAIN NATRIURETIC PEPTIDE: B Natriuretic Peptide: 101 pg/mL — ABNORMAL HIGH (ref 0.0–100.0)

## 2018-10-22 MED ORDER — ONDANSETRON HCL 4 MG PO TABS
4.0000 mg | ORAL_TABLET | Freq: Four times a day (QID) | ORAL | Status: DC | PRN
  Administered 2018-10-25: 13:00:00 4 mg via ORAL
  Filled 2018-10-22: qty 1

## 2018-10-22 MED ORDER — FUROSEMIDE 20 MG PO TABS
20.0000 mg | ORAL_TABLET | Freq: Every day | ORAL | Status: DC
  Administered 2018-10-23 – 2018-10-28 (×5): 20 mg via ORAL
  Filled 2018-10-22 (×6): qty 1

## 2018-10-22 MED ORDER — POTASSIUM CHLORIDE CRYS ER 20 MEQ PO TBCR
30.0000 meq | EXTENDED_RELEASE_TABLET | Freq: Two times a day (BID) | ORAL | Status: DC
  Administered 2018-10-22 – 2018-10-28 (×11): 30 meq via ORAL
  Filled 2018-10-22 (×11): qty 1

## 2018-10-22 MED ORDER — HYDROCODONE-ACETAMINOPHEN 5-325 MG PO TABS
1.0000 | ORAL_TABLET | ORAL | Status: DC | PRN
  Administered 2018-10-23 – 2018-10-25 (×4): 2 via ORAL
  Filled 2018-10-22 (×5): qty 2

## 2018-10-22 MED ORDER — SODIUM CHLORIDE 0.9 % IV SOLN
2.0000 g | Freq: Once | INTRAVENOUS | Status: AC
  Administered 2018-10-22: 12:00:00 2 g via INTRAVENOUS
  Filled 2018-10-22: qty 2

## 2018-10-22 MED ORDER — METRONIDAZOLE IN NACL 5-0.79 MG/ML-% IV SOLN
500.0000 mg | Freq: Three times a day (TID) | INTRAVENOUS | Status: DC
  Administered 2018-10-23 – 2018-10-24 (×2): 500 mg via INTRAVENOUS
  Filled 2018-10-22 (×2): qty 100

## 2018-10-22 MED ORDER — TRAMADOL HCL 50 MG PO TABS
50.0000 mg | ORAL_TABLET | Freq: Four times a day (QID) | ORAL | Status: DC | PRN
  Administered 2018-10-22 – 2018-10-28 (×7): 50 mg via ORAL
  Filled 2018-10-22 (×7): qty 1

## 2018-10-22 MED ORDER — ROPINIROLE HCL 1 MG PO TABS
1.0000 mg | ORAL_TABLET | Freq: Every day | ORAL | Status: DC
  Administered 2018-10-22 – 2018-10-27 (×6): 1 mg via ORAL
  Filled 2018-10-22 (×6): qty 1

## 2018-10-22 MED ORDER — SODIUM CHLORIDE 0.9 % IV SOLN
1000.0000 mL | INTRAVENOUS | Status: DC
  Administered 2018-10-22 (×2): 1000 mL via INTRAVENOUS

## 2018-10-22 MED ORDER — LISINOPRIL 10 MG PO TABS
10.0000 mg | ORAL_TABLET | Freq: Every day | ORAL | Status: DC
  Administered 2018-10-23 – 2018-10-28 (×5): 10 mg via ORAL
  Filled 2018-10-22 (×5): qty 1

## 2018-10-22 MED ORDER — ALBUTEROL SULFATE (2.5 MG/3ML) 0.083% IN NEBU
3.0000 mL | INHALATION_SOLUTION | Freq: Four times a day (QID) | RESPIRATORY_TRACT | Status: DC
  Administered 2018-10-23 – 2018-10-27 (×16): 3 mL via RESPIRATORY_TRACT
  Filled 2018-10-22 (×16): qty 3

## 2018-10-22 MED ORDER — VANCOMYCIN HCL IN DEXTROSE 1-5 GM/200ML-% IV SOLN
1000.0000 mg | Freq: Once | INTRAVENOUS | Status: AC
  Administered 2018-10-22: 15:00:00 1000 mg via INTRAVENOUS
  Filled 2018-10-22: qty 200

## 2018-10-22 MED ORDER — ONDANSETRON HCL 4 MG/2ML IJ SOLN
4.0000 mg | Freq: Four times a day (QID) | INTRAMUSCULAR | Status: DC | PRN
  Administered 2018-10-23 (×2): 4 mg via INTRAVENOUS
  Filled 2018-10-22 (×2): qty 2

## 2018-10-22 MED ORDER — IOHEXOL 350 MG/ML SOLN
100.0000 mL | Freq: Once | INTRAVENOUS | Status: AC | PRN
  Administered 2018-10-22: 15:00:00 100 mL via INTRAVENOUS

## 2018-10-22 MED ORDER — ATENOLOL 25 MG PO TABS
25.0000 mg | ORAL_TABLET | Freq: Two times a day (BID) | ORAL | Status: DC
  Administered 2018-10-22 – 2018-10-28 (×11): 25 mg via ORAL
  Filled 2018-10-22 (×12): qty 1

## 2018-10-22 MED ORDER — CARBIDOPA-LEVODOPA 25-100 MG PO TABS
1.0000 | ORAL_TABLET | Freq: Every day | ORAL | Status: DC
  Administered 2018-10-22 – 2018-10-28 (×6): 1 via ORAL
  Filled 2018-10-22 (×7): qty 1

## 2018-10-22 MED ORDER — ATORVASTATIN CALCIUM 40 MG PO TABS
40.0000 mg | ORAL_TABLET | Freq: Every day | ORAL | Status: DC
  Administered 2018-10-22 – 2018-10-28 (×6): 40 mg via ORAL
  Filled 2018-10-22 (×7): qty 1

## 2018-10-22 MED ORDER — POTASSIUM CHLORIDE CRYS ER 20 MEQ PO TBCR
40.0000 meq | EXTENDED_RELEASE_TABLET | Freq: Once | ORAL | Status: AC
  Administered 2018-10-22: 15:00:00 40 meq via ORAL
  Filled 2018-10-22: qty 2

## 2018-10-22 MED ORDER — ROPINIROLE HCL 0.25 MG PO TABS
0.5000 mg | ORAL_TABLET | Freq: Every morning | ORAL | Status: DC
  Administered 2018-10-23 – 2018-10-28 (×5): 0.5 mg via ORAL
  Filled 2018-10-22 (×5): qty 2

## 2018-10-22 MED ORDER — PAROXETINE HCL 20 MG PO TABS
20.0000 mg | ORAL_TABLET | Freq: Every day | ORAL | Status: DC
  Administered 2018-10-22 – 2018-10-28 (×6): 20 mg via ORAL
  Filled 2018-10-22 (×6): qty 1

## 2018-10-22 MED ORDER — IPRATROPIUM-ALBUTEROL 0.5-2.5 (3) MG/3ML IN SOLN: 3.0000 mL | Freq: Three times a day (TID) | RESPIRATORY_TRACT | Status: DC | PRN

## 2018-10-22 MED ORDER — ENOXAPARIN SODIUM 40 MG/0.4ML ~~LOC~~ SOLN
40.0000 mg | SUBCUTANEOUS | Status: DC
  Administered 2018-10-22 – 2018-10-27 (×6): 40 mg via SUBCUTANEOUS
  Filled 2018-10-22 (×6): qty 0.4

## 2018-10-22 MED ORDER — SODIUM CHLORIDE 0.9 % IV SOLN
2.0000 g | Freq: Three times a day (TID) | INTRAVENOUS | Status: DC
  Administered 2018-10-22 – 2018-10-26 (×11): 2 g via INTRAVENOUS
  Filled 2018-10-22 (×11): qty 2

## 2018-10-22 MED ORDER — VANCOMYCIN HCL IN DEXTROSE 1-5 GM/200ML-% IV SOLN
1000.0000 mg | Freq: Once | INTRAVENOUS | Status: AC
  Administered 2018-10-22: 13:00:00 1000 mg via INTRAVENOUS
  Filled 2018-10-22: qty 200

## 2018-10-22 MED ORDER — VANCOMYCIN HCL IN DEXTROSE 750-5 MG/150ML-% IV SOLN
750.0000 mg | Freq: Two times a day (BID) | INTRAVENOUS | Status: DC
  Administered 2018-10-23 – 2018-10-25 (×5): 750 mg via INTRAVENOUS
  Filled 2018-10-22 (×5): qty 150

## 2018-10-22 MED ORDER — ALBUTEROL SULFATE (2.5 MG/3ML) 0.083% IN NEBU
3.0000 mL | INHALATION_SOLUTION | Freq: Four times a day (QID) | RESPIRATORY_TRACT | Status: DC
  Administered 2018-10-22: 20:00:00 3 mL via RESPIRATORY_TRACT

## 2018-10-22 MED ORDER — POLYETHYLENE GLYCOL 3350 17 G PO PACK: 17.0000 g | PACK | Freq: Every day | ORAL | Status: DC | PRN

## 2018-10-22 MED ORDER — METRONIDAZOLE IN NACL 5-0.79 MG/ML-% IV SOLN
500.0000 mg | Freq: Once | INTRAVENOUS | Status: AC
  Administered 2018-10-22: 14:00:00 500 mg via INTRAVENOUS
  Filled 2018-10-22: qty 100

## 2018-10-22 MED ORDER — MOMETASONE FURO-FORMOTEROL FUM 100-5 MCG/ACT IN AERO
2.0000 | INHALATION_SPRAY | Freq: Two times a day (BID) | RESPIRATORY_TRACT | Status: DC
  Administered 2018-10-23 – 2018-10-28 (×9): 2 via RESPIRATORY_TRACT
  Filled 2018-10-22: qty 8.8

## 2018-10-22 MED ORDER — ASPIRIN EC 81 MG PO TBEC
81.0000 mg | DELAYED_RELEASE_TABLET | Freq: Every day | ORAL | Status: DC
  Administered 2018-10-22 – 2018-10-28 (×6): 81 mg via ORAL
  Filled 2018-10-22 (×8): qty 1

## 2018-10-22 MED ORDER — FAMOTIDINE 20 MG PO TABS
20.0000 mg | ORAL_TABLET | Freq: Every day | ORAL | Status: DC
  Administered 2018-10-22 – 2018-10-28 (×6): 20 mg via ORAL
  Filled 2018-10-22 (×7): qty 1

## 2018-10-22 MED ORDER — ROPINIROLE HCL 0.25 MG PO TABS: 0.5000 mg | ORAL_TABLET | ORAL | Status: DC

## 2018-10-22 NOTE — ED Notes (Signed)
Report given to [employer] on 300

## 2018-10-22 NOTE — ED Notes (Signed)
Dr holding IVF at this time.

## 2018-10-22 NOTE — ED Provider Notes (Signed)
Pt received at sign out with CT's pending. Please see previous EDP note for complete HPI/H&P/MDM. Pt's CT's unremarkable. COVID negative. BP's remain stable. O2 Sats however dropping into high 80's; O2 N/C 2L applied. Given pt's c/o LBP, may need MRI LS r/o discitis/etc (not available at [facility] at this time). Pt remains without neuro deficits, NAD, resps easy, abd benign. 1720:  T/C returned from [facility] Adrian Blackwater, case discussed, including:  HPI, pertinent PM/SHx, VS/PE, dx testing, ED course and treatment:  Agreeable to come to ED for evaluation for admission.   Samuel Jester, DO 10/22/18 1744

## 2018-10-22 NOTE — ED Triage Notes (Signed)
Pt c/o of left hip pain that radiates down left leg x 1 day. Denies any fall or recent injuries

## 2018-10-22 NOTE — H&P (Addendum)
History and Physical  [name]:[id number] DOB: 1940-10-11 DOA: 10/22/2018  Referring physician: Dr Thurnell Garbe, ED physician PCP: Octavio Graves, DO  Outpatient Specialists:   Patient Coming From: home  Chief Complaint: left hip pain  HPI: Briana Dean is a 78 y.o. 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  Patient presents with left hip pain that started 1 to 2 days ago and is located in her left buttock and radiates down leg.  Pain is 10 out of 10.  Pain is same location as her chronic hip and leg pain, but much more severe.  No recent falls, injuries.  Patient states that movement appears to improve her pain, as well as massage.  No provoking factors.  On arrival, the patient was noted to have a fever and hypoxia.  The patient was unaware of her fever, although admits to feeling cold.  Upon questioning, she does admit to some shortness of breath with exertion but is generally okay with rest.  Occasional intermittent cough that is nonproductive.  She denies abdominal pain, nausea, vomiting, diarrhea.  Emergency Department Course: Sepsis work-up started with blood cultures, urine cultures.  Broad-spectrum antibiotics of vancomycin, cefepime, Flagyl were started.  White count 10.  Lactic acid normal.  Potassium 3.1  Review of Systems:   Pt denies any fevers, chills, nausea, vomiting, diarrhea, constipation, abdominal pain, shortness of breath, with dyspnea on exertion, orthopnea, cough, wheezing, palpitations, headache, vision changes, lightheadedness, dizziness, melena, rectal bleeding.  Review of systems are otherwise negative  Past Medical History:  Diagnosis Date   AAA (abdominal aortic aneurysm) (HCC)    Acute on chronic respiratory failure with hypoxia (HCC)    Anemia, unspecified     Aneurysm of thoracic aorta (HCC)    Anxiety disorder    Chronic combined systolic and diastolic heart failure (HCC)    Constipation    COPD (chronic obstructive pulmonary disease) (HCC)    Dissection of distal aorta (HCC)    Gastroesophageal reflux disease without esophagitis    Hyperlipidemia, unspecified    Hypertension, essential    Insomnia, unspecified    Low back pain    Major depressive disorder, single episode, unspecified    PAD (peripheral artery disease) (HCC)    Respiratory failure (HCC)    Restless leg syndrome    Unspecified protein-calorie malnutrition ([place])    Weakness    Past Surgical History:  Procedure Laterality Date   IR KYPHO EA ADDL LEVEL THORACIC OR LUMBAR  03/25/2018   IR KYPHO LUMBAR INC FX REDUCE BONE BX UNI/BIL CANNULATION INC/IMAGING  03/25/2018   THORACIC AORTIC ANEURYSM REPAIR  09/06/2016   TEVAR   TRACHEOSTOMY  08/2016   VIDEO ASSISTED THORACOSCOPY (VATS)/THOROCOTOMY  08/2016   Social History:  reports that she quit smoking about 2 years ago. Her smoking use included cigarettes. She has a 60.00 pack-year smoking history. She has never used smokeless tobacco. She reports that she does not drink alcohol or use drugs. Patient lives at home  Allergies  Allergen Reactions   Aspirin Other (See Comments)    Stomach pain   Penicillins     Has patient had a PCN reaction causing immediate rash, facial/tongue/throat swelling, SOB or lightheadedness with hypotension No Has patient had a PCN reaction causing severe rash involving mucus membranes or skin necrosis: No Has patient had a PCN reaction that  required hospitalization: No Has patient had a PCN reaction occurring within the last 10 years: No If all of the above answers are "NO", then may proceed with Cephalosporin use.     Family History  Problem Relation Age of Onset   Congenital heart disease Mother    Heart disease Mother    Peripheral vascular disease Father     Alcoholism Father    Pectus carinatum Son    Heart failure Son    Heart attack Son 32   COPD Sister       Prior to Admission medications   Medication Sig Start Date End Date Taking? Authorizing Provider  atenolol (TENORMIN) 25 MG tablet Take 25 mg by mouth 2 (two) times daily.    Yes [provider]  carbidopa-levodopa (SINEMET IR) 25-100 MG tablet Take 1 tablet by mouth daily.   Yes [provider]  ergocalciferol (VITAMIN D2) 50000 units capsule Take 50,000 Units by mouth once a week.   Yes [provider]  famotidine (PEPCID) 20 MG tablet Take 20 mg by mouth daily.   Yes [provider]  furosemide (LASIX) 20 MG tablet Take 20 mg by mouth daily.   Yes [provider]  lisinopril (PRINIVIL,ZESTRIL) 10 MG tablet TAKE 1 TABLET BY MOUTH ONCE DAILY Patient taking differently: Take 10 mg by mouth daily.  12/06/17  Yes Herminio Commons, MD  PARoxetine (PAXIL) 20 MG tablet Take 20 mg by mouth daily.   Yes [provider]  rOPINIRole (REQUIP) 0.5 MG tablet Take 0.5-1 mg by mouth See admin instructions. 0.'5mg'$  in the morning and '1mg'$  at bedtime   Yes [provider]  traMADol (ULTRAM) 50 MG tablet Take by mouth every 6 (six) hours as needed.   Yes [provider]  aspirin EC 81 MG tablet Take 81 mg by mouth daily.    [provider]  atorvastatin (LIPITOR) 40 MG tablet Take 40 mg by mouth daily.    [provider]  ferrous sulfate 324 (65 Fe) MG TBEC Take by mouth daily.    [provider]  Ipratropium-Albuterol (DUONEB IN) Inhale into the lungs 3 (three) times daily.    [provider]  Nutritional Supplements (RA MELATONIN/B-6 PO) Take 1 tablet by mouth at bedtime.    [provider]  polyethylene glycol (MIRALAX / GLYCOLAX) packet Take 17 g by mouth daily as needed for mild constipation.     [provider]    Physical Exam: BP (!) 157/111    Pulse (!) 221    Temp  (!) 101.9 F (38.8 C) (Rectal)    Resp (!) 26    Ht '5\' 7"'$  (1.702 m)    Wt 77.1 kg    LMP  (LMP Unknown)    SpO2 (!) 58%    BMI 26.63 kg/m    General: Elderly Caucasian female. Awake and alert and oriented x3. No acute cardiopulmonary distress.   HEENT: Normocephalic atraumatic.  Right and left ears normal in appearance.  Pupils equal, round, reactive to light. Extraocular muscles are intact. Sclerae anicteric and noninjected.  Moist mucosal membranes. No mucosal lesions.   Neck: Neck supple without lymphadenopathy. No carotid bruits. No masses palpated.   Cardiovascular: Regular rate with normal S1-S2 sounds. No murmurs, rubs, gallops auscultated. No JVD.   Respiratory: Breath sounds slightly tight, but no wheezes, rales, rhonchi. Lungs clear to auscultation bilaterally.  No accessory muscle use.  Abdomen: Soft, nontender, nondistended. Active bowel sounds. No masses or  hepatosplenomegaly   Skin: No rashes, lesions, or ulcerations.  Dry, warm to touch. 2+ dorsalis pedis and radial pulses.  Musculoskeletal: No spinous process tenderness throughout entire spine.  She does have point tenderness in the left buttock around the piriformis.  No calf or leg pain. All major joints not erythematous nontender.  No upper or lower joint deformation.  Good ROM.  No contractures   Psychiatric: Intact judgment and insight. Pleasant and cooperative.  Neurologic: No focal neurological deficits. Strength is 5/5 and symmetric in upper and lower extremities.  Cranial nerves II through XII are grossly intact.           Labs on Admission: I have personally reviewed following labs and imaging studies  CBC: Recent Labs  Lab 10/22/18 1128  WBC 10.1  NEUTROABS 8.5*  HGB 11.5*  HCT 35.2*  MCV 93.9  PLT 629*   Basic Metabolic Panel: Recent Labs  Lab 10/22/18 1128  NA 137  K 3.1*  CL 98  CO2 27  GLUCOSE 122*  BUN 11  CREATININE 0.61  CALCIUM 8.7*   GFR: Estimated Creatinine Clearance: 64  mL/min (by C-G formula based on SCr of 0.61 mg/dL). Liver Function Tests: Recent Labs  Lab 10/22/18 1128  AST 11*  ALT <5  ALKPHOS 69  BILITOT 1.5*  PROT 7.2  ALBUMIN 3.6   No results for input(s): LIPASE, AMYLASE in the last 168 hours. No results for input(s): AMMONIA in the last 168 hours. Coagulation Profile: No results for input(s): INR, PROTIME in the last 168 hours. Cardiac Enzymes: Recent Labs  Lab 10/22/18 1128  TROPONINI <0.03   BNP (last 3 results) No results for input(s): PROBNP in the last 8760 hours. HbA1C: No results for input(s): HGBA1C in the last 72 hours. CBG: No results for input(s): GLUCAP in the last 168 hours. Lipid Profile: No results for input(s): CHOL, HDL, LDLCALC, TRIG, CHOLHDL, LDLDIRECT in the last 72 hours. Thyroid Function Tests: No results for input(s): TSH, T4TOTAL, FREET4, T3FREE, THYROIDAB in the last 72 hours. Anemia Panel: No results for input(s): VITAMINB12, FOLATE, FERRITIN, TIBC, IRON, RETICCTPCT in the last 72 hours. Urine analysis:    Component Value Date/Time   COLORURINE YELLOW 10/22/2018 1425   APPEARANCEUR HAZY (A) 10/22/2018 1425   LABSPEC 1.008 10/22/2018 1425   PHURINE 8.0 10/22/2018 1425   GLUCOSEU NEGATIVE 10/22/2018 1425   [place] 10/22/2018 [place] 10/22/2018 [place] 10/22/2018 1425   PROTEINUR NEGATIVE 10/22/2018 1425   NITRITE NEGATIVE 10/22/2018 [place] 10/22/2018 1425   Sepsis Labs: '@LABRCNTIP'$ (procalcitonin:4,lacticidven:4) ) Recent Results (from the past 240 hour(s))  Blood Culture (routine x 2)     Status: None (Preliminary result)   Collection Time: 10/22/18 11:29 AM  Result Value Ref Range Status   Specimen Description BLOOD LEFT ANTECUBITAL  Final   Special Requests   Final    BOTTLES DRAWN AEROBIC AND ANAEROBIC Blood Culture adequate volume   Culture   Final    NO GROWTH < 12 HOURS Performed at [facility], [address],  [place], [place] [address]    Report Status PENDING  Incomplete  Blood Culture (routine x 2)     Status: None (Preliminary result)   Collection Time: 10/22/18 11:29 AM  Result Value Ref Range Status   Specimen Description BLOOD BLOOD RIGHT WRIST  Final   Special Requests   Final    BOTTLES DRAWN AEROBIC AND ANAEROBIC Blood Culture adequate volume  Culture   Final    NO GROWTH < 12 HOURS Performed at [facility], [address], [place], [place] [address]    Report Status PENDING  Incomplete  SARS Coronavirus 2 (CEPHEID- Performed in [place] hospital lab), Hosp Order     Status: None   Collection Time: 10/22/18 12:37 PM  Result Value Ref Range Status   SARS Coronavirus 2 NEGATIVE NEGATIVE Final    Comment: (NOTE) If result is NEGATIVE SARS-CoV-2 target nucleic acids are NOT DETECTED. The SARS-CoV-2 RNA is generally detectable in upper and lower  respiratory specimens during the acute phase of infection. The lowest  concentration of SARS-CoV-2 viral copies this assay can detect is 250  copies / mL. A negative result does not preclude SARS-CoV-2 infection  and should not be used as the sole basis for treatment or other  patient management decisions.  A negative result may occur with  improper specimen collection / handling, submission of specimen other  than nasopharyngeal swab, presence of viral mutation(s) within the  areas targeted by this assay, and inadequate number of viral copies  (<250 copies / mL). A negative result must be combined with clinical  observations, patient history, and epidemiological information. If result is POSITIVE SARS-CoV-2 target nucleic acids are DETECTED. The SARS-CoV-2 RNA is generally detectable in upper and lower  respiratory specimens dur ing the acute phase of infection.  Positive  results are indicative of active infection with SARS-CoV-2.  Clinical  correlation with patient history and other diagnostic information is  necessary to determine  patient infection status.  Positive results do  not rule out bacterial infection or co-infection with other viruses. If result is PRESUMPTIVE POSTIVE SARS-CoV-2 nucleic acids MAY BE PRESENT.   A presumptive positive result was obtained on the submitted specimen  and confirmed on repeat testing.  While 2019 novel coronavirus  (SARS-CoV-2) nucleic acids may be present in the submitted sample  additional confirmatory testing may be necessary for epidemiological  and / or clinical management purposes  to differentiate between  SARS-CoV-2 and other Sarbecovirus currently known to infect humans.  If clinically indicated additional testing with an alternate test  methodology [phone]) is advised. The SARS-CoV-2 RNA is generally  detectable in upper and lower respiratory sp ecimens during the acute  phase of infection. The expected result is Negative. Fact Sheet for Patients:  StrictlyIdeas.no Fact Sheet for Healthcare Providers: BankingDealers.co.za This test is not yet approved or cleared by the [place] FDA and has been authorized for detection and/or diagnosis of SARS-CoV-2 by FDA under an Emergency Use Authorization (EUA).  This EUA will remain in effect (meaning this test can be used) for the duration of the COVID-19 declaration under Section 564(b)(1) of the Act, 21 U.S.C. section 360bbb-3(b)(1), unless the authorization is terminated or revoked sooner. Performed at [facility], [address], [place], [place] [address]      Radiological Exams on Admission: Dg Chest Port 1 View  Result Date: 10/22/2018 CLINICAL DATA:  Fever EXAM: PORTABLE CHEST 1 VIEW COMPARISON:  Chest radiograph July 22, 2017 and chest CT July 27, 2018 FINDINGS: There is no edema or consolidation. Heart is slightly enlarged with pulmonary vascularity normal. There is a stent extending throughout most of the aorta, stable. No adenopathy. No bone lesions.  IMPRESSION: Aortic stent graft, without appreciable demonstrable change in position. Mild cardiac enlargement. No edema or consolidation. Electronically Signed   By: Lowella Grip III M.D.   On: 10/22/2018 12:13   Ct  No Charge  Result Date: 10/22/2018 CLINICAL DATA:  78 year old female with a history of left hip pain. 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 EXAM: CT ANGIOGRAPHY CHEST, ABDOMEN AND PELVIS TECHNIQUE: Multidetector CT imaging through the chest, abdomen and pelvis was performed using the standard protocol during bolus administration of intravenous contrast. Multiplanar reconstructed images and MIPs were obtained and reviewed to evaluate the vascular anatomy. CONTRAST:  156m OMNIPAQUE IOHEXOL 350 MG/ML SOLN COMPARISON:  Multiple prior most recent 07/27/2018, 02/01/2018, 07/22/2017, 03/15/2017 FINDINGS: CTA CHEST FINDINGS Cardiovascular: Cardiomegaly. No pericardial fluid/thickening. Calcifications of left anterior descending, circumflex, right coronary arteries. Minimal calcifications of the aortic valve. Ascending aorta within normal limits. Redemonstration of overlapping stent grafts of the aortic arch and descending thoracic aorta for endovascular solution for acute aortic syndrome. The proximal margin of the stent graft is unchanged. Branch vessels remain patent. The left subclavian artery appears patent at the proximal aspect, either from antegrade flow or collateral perfusion. The stent graft terminates in the thoracic aorta just above the hiatus. Diameter at the the hiatus measures 3.4 cm. Main pulmonary artery measures 4.1 cm. No filling defects, although the timing of the contrast bolus is suboptimal. Mediastinum/Nodes: No mediastinal adenopathy. Unremarkable thoracic inlet. There is debris within the mid and distal thoracic esophagus. Lungs/Pleura: Centrilobular emphysema. No pneumothorax. Linear scarring/atelectasis at the lung bases. No confluent airspace disease.  Redemonstration of left lower lobe calcified granulomas. 4 mm nodule in the right upper lobe periphery on image 72 of series 7. 5 mm nodule in the right lower lobe on image 83 of series 7. No endotracheal or endobronchial debris. Musculoskeletal: No displaced fracture. Degenerative changes of the spine. No bony canal narrowing. Review of the MIP images confirms the above findings. CTA ABDOMEN AND PELVIS FINDINGS VASCULAR Aorta: Atherosclerosis of the abdominal aorta. The juxtarenal aorta measures 2.6 cm. Largest diameter of the infrarenal abdominal aorta measures 31 mm, unchanged from prior. 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 Greatest diameter at this site measures 32 mm, slightly more than the baseline study of 03/15/2017 and no significant change from the comparison CT. No inflammatory changes or fluid. Celiac: Celiac artery remains patent, compressed at the origin with a configuration most compatible with overlying diaphragmatic cruise. SMA: No significant atherosclerosis at the SMA origin. Renals: Minimal atherosclerosis of the left renal artery. Minimal atherosclerosis of the right main renal artery. Small accessory right renal artery above the main renal artery and a small accessory renal artery below the main right renal artery. IMA: IMA remains patent, originating from the anterior aspect of the chronic dissection/irregular plaque Right lower extremity: Atherosclerotic changes of the right iliac system without occlusion or high-grade stenosis. Hypogastric artery remains patent. Common femoral artery with minimal atherosclerosis. Proximal profunda femoris and SFA patent. Left lower extremity: Atherosclerotic changes of the left iliac system without high-grade stenosis or occlusion. Hypogastric artery is patent. Common femoral artery with minimal atherosclerosis. Proximal profunda femoris and SFA patent. Veins: Unremarkable appearance of the venous system.  Review of the MIP images confirms the above findings. NON-VASCULAR Hepatobiliary: Unremarkable appearance of the liver. Minimal calcified cholelithiasis within the dependent gallbladder. No inflammatory changes Pancreas: Unremarkable Spleen: Coarse calcifications of the spleen representing prior granulomatous disease Adrenals/Urinary Tract: Unremarkable left adrenal gland. 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 Right: Symmetric perfusion to the left kidney. No hydronephrosis. No nephrolithiasis. Unremarkable right ureter. Small lesion on the lateral cortex may represent hemorrhagic cyst, incompletely characterized on the current  study. Left: Symmetric perfusion to the right. No hydronephrosis. Nonobstructing stone at the inferior collecting system. Cyst at the superior cortex of the left kidney. Additional small lesions of the left kidney are too small to characterize. Unremarkable urinary bladder. Stomach/Bowel: Hiatal hernia. Unremarkable stomach. Unremarkable appearance of small bowel. No evidence of obstruction. Surgical changes of appendectomy. Colonic diverticula with no acute inflammatory changes. Lymphatic: No adenopathy. Mesenteric: No free fluid or air. No adenopathy. Reproductive: Hysterectomy Other: Surgical clips along the midline abdomen of prior surgery. No abdominal hernia Musculoskeletal: Scoliotic curvature of the thoracolumbar spine. Changes of vertebral augmentation of L3 and L4. Degenerative changes of the thoracolumbar spine. No acute displaced fracture. Left lower extremity/hip: Mild degenerative changes of the left sacroiliac joint with no acute displaced fracture. No pelvic fracture identified. Left hip is congruent with no subluxation/dislocation. Mild joint space narrowing symmetric to the right. No femoral fracture identified. No left-sided joint effusion. Unremarkable appearance of the left hip musculature with no focal soft tissue swelling. No soft tissue lesion. No  lymphadenopathy. IMPRESSION: No acute CT finding of the chest, abdomen, pelvis, or left hip. Redemonstration of endovascular repair of thoracic aorta with overlapping stent grafts of the aortic arch and descending thoracic aorta. 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 The aorta just above this plaque/dissection measures also 3.1 cm, unchanged. Aortic atherosclerosis. Aortic Atherosclerosis (ICD10-I70.0). Associated coronary artery disease. Enlarged main pulmonary artery suggesting early pulmonary hypertension. Additional ancillary findings as above. Signed, Dulcy Fanny. Dellia Nims, RPVI [facility] Radiology Electronically Signed   By: Corrie Mckusick D.O.   On: 10/22/2018 16:35   Ct Angio Chest/abd/pel For Dissection W And/or W/wo  Result Date: 10/22/2018 CLINICAL DATA:  78 year old female with a history of left hip pain. 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 EXAM: CT ANGIOGRAPHY CHEST, ABDOMEN AND PELVIS TECHNIQUE: Multidetector CT imaging through the chest, abdomen and pelvis was performed using the standard protocol during bolus administration of intravenous contrast. Multiplanar reconstructed images and MIPs were obtained and reviewed to evaluate the vascular anatomy. CONTRAST:  14m OMNIPAQUE IOHEXOL 350 MG/ML SOLN COMPARISON:  Multiple prior most recent 07/27/2018, 02/01/2018, 07/22/2017, 03/15/2017 FINDINGS: CTA CHEST FINDINGS Cardiovascular: Cardiomegaly. No pericardial fluid/thickening. Calcifications of left anterior descending, circumflex, right coronary arteries. Minimal calcifications of the aortic valve. Ascending aorta within normal limits. Redemonstration of overlapping stent grafts of the aortic arch and descending thoracic aorta for endovascular solution for acute aortic syndrome. The proximal margin of the stent graft is  unchanged. Branch vessels remain patent. The left subclavian artery appears patent at the proximal aspect, either from antegrade flow or collateral perfusion. The stent graft terminates in the thoracic aorta just above the hiatus. Diameter at the the hiatus measures 3.4 cm. Main pulmonary artery measures 4.1 cm. No filling defects, although the timing of the contrast bolus is suboptimal. Mediastinum/Nodes: No mediastinal adenopathy. Unremarkable thoracic inlet. There is debris within the mid and distal thoracic esophagus. Lungs/Pleura: Centrilobular emphysema. No pneumothorax. Linear scarring/atelectasis at the lung bases. No confluent airspace disease. Redemonstration of left lower lobe calcified granulomas. 4 mm nodule in the right upper lobe periphery on image 72 of series 7. 5 mm nodule in the right lower lobe on image 83 of series 7. No endotracheal or endobronchial debris. Musculoskeletal: No displaced fracture. Degenerative changes of the spine. No bony canal narrowing. Review of the MIP images confirms the above findings. CTA  ABDOMEN AND PELVIS FINDINGS VASCULAR Aorta: Atherosclerosis of the abdominal aorta. The juxtarenal aorta measures 2.6 cm. Largest diameter of the infrarenal abdominal aorta measures 31 mm, unchanged from prior. 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 Greatest diameter at this site measures 32 mm, slightly more than the baseline study of 03/15/2017 and no significant change from the comparison CT. No inflammatory changes or fluid. Celiac: Celiac artery remains patent, compressed at the origin with a configuration most compatible with overlying diaphragmatic cruise. SMA: No significant atherosclerosis at the SMA origin. Renals: Minimal atherosclerosis of the left renal artery. Minimal atherosclerosis of the right main renal artery. Small accessory right renal artery above the main renal artery and a small accessory renal artery below the  main right renal artery. IMA: IMA remains patent, originating from the anterior aspect of the chronic dissection/irregular plaque Right lower extremity: Atherosclerotic changes of the right iliac system without occlusion or high-grade stenosis. Hypogastric artery remains patent. Common femoral artery with minimal atherosclerosis. Proximal profunda femoris and SFA patent. Left lower extremity: Atherosclerotic changes of the left iliac system without high-grade stenosis or occlusion. Hypogastric artery is patent. Common femoral artery with minimal atherosclerosis. Proximal profunda femoris and SFA patent. Veins: Unremarkable appearance of the venous system. Review of the MIP images confirms the above findings. NON-VASCULAR Hepatobiliary: Unremarkable appearance of the liver. Minimal calcified cholelithiasis within the dependent gallbladder. No inflammatory changes Pancreas: Unremarkable Spleen: Coarse calcifications of the spleen representing prior granulomatous disease Adrenals/Urinary Tract: Unremarkable left adrenal gland. 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 Right: Symmetric perfusion to the left kidney. No hydronephrosis. No nephrolithiasis. Unremarkable right ureter. Small lesion on the lateral cortex may represent hemorrhagic cyst, incompletely characterized on the current study. Left: Symmetric perfusion to the right. No hydronephrosis. Nonobstructing stone at the inferior collecting system. Cyst at the superior cortex of the left kidney. Additional small lesions of the left kidney are too small to characterize. Unremarkable urinary bladder. Stomach/Bowel: Hiatal hernia. Unremarkable stomach. Unremarkable appearance of small bowel. No evidence of obstruction. Surgical changes of appendectomy. Colonic diverticula with no acute inflammatory changes. Lymphatic: No adenopathy. Mesenteric: No free fluid or air. No adenopathy. Reproductive: Hysterectomy Other: Surgical clips along the midline abdomen of prior  surgery. No abdominal hernia Musculoskeletal: Scoliotic curvature of the thoracolumbar spine. Changes of vertebral augmentation of L3 and L4. Degenerative changes of the thoracolumbar spine. No acute displaced fracture. Left lower extremity/hip: Mild degenerative changes of the left sacroiliac joint with no acute displaced fracture. No pelvic fracture identified. Left hip is congruent with no subluxation/dislocation. Mild joint space narrowing symmetric to the right. No femoral fracture identified. No left-sided joint effusion. Unremarkable appearance of the left hip musculature with no focal soft tissue swelling. No soft tissue lesion. No lymphadenopathy. IMPRESSION: No acute CT finding of the chest, abdomen, pelvis, or left hip. Redemonstration of endovascular repair of thoracic aorta with overlapping stent grafts of the aortic arch and descending thoracic aorta. 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 The aorta just above this plaque/dissection measures also 3.1 cm, unchanged. Aortic atherosclerosis. Aortic Atherosclerosis (ICD10-I70.0). Associated coronary artery disease. Enlarged main pulmonary artery suggesting early pulmonary hypertension. Additional ancillary findings as above. Signed, Dulcy Fanny. Dellia Nims, RPVI [facility] Radiology Electronically Signed   By: Corrie Mckusick D.O.   On: 10/22/2018 16:35    EKG: Independently reviewed.  Sinus  rhythm with left atrial enlargement.  Right bundle branch block.  No acute ST changes.  Assessment/Plan: Principal Problem:   Fever Active Problems:   Congestive heart failure (HCC)   Ruptured aneurysm of thoracic aorta (HCC)   Essential hypertension   Hypokalemia   Hip pain, left   Hypoxia    This patient was discussed with the ED physician, including pertinent vitals, physical exam findings,  labs, and imaging.  We also discussed care given by the ED provider.  1. Fever a. Unknown source. b. Cultures pending c. EDP suggested discitis/osteomyelitis, however there is no back pain elicited on my exam or elicited with her movements or at rest. d. Continue broad-spectrum antibiotics e. I have ordered an ESR and CRP to be added onto the previous collections.  These are still pending at this time. f. COVID negative g. Add respiratory virus panel h. CBC and BMP tomorrow 2. Left hip pain a. Uncertain as to how this connects to the patient's fever.  At this point, I doubt that the 2 are related. b. CT of the pelvis was able to view the hip joints, showing mild osteoarthritis c. We will have physical therapy see the patient 3. Hypoxia a. No particular cough, no evidence of infiltrate or congestive heart failure on x-ray b. The patient does have COPD and does patient appears to have some tight breath sounds, although no true evidence of bronchospasm.   c. The patient does not appear to be on a controller inhaler at home. Will start dulera. d. Will start with albuterol and DuoNeb's to see if this improves the patient's hypoxia e. Continuous pulse oximetry 4. Hypokalemia a. We will replace potassium and recheck potassium in the morning 5. Hypertension a. Continue antihypertensives 6. History of ruptured aneurysm a. Stable 7. Congestive heart failure a. Appears to be compensated at this time  DVT prophylaxis: Lovenox Consultants: None Code Status: Full code Family Communication: None Disposition Plan: Patient should be able to return home following admission   Truett Mainland, DO

## 2018-10-22 NOTE — ED Notes (Signed)
Placed on O2@2L /M

## 2018-10-22 NOTE — ED Notes (Signed)
Not able to obtain second IV.

## 2018-10-22 NOTE — ED Notes (Signed)
Bolus not ordered per MD.

## 2018-10-22 NOTE — ED Provider Notes (Addendum)
[facility] EMERGENCY DEPARTMENT Provider Note   CSN: [id number] Arrival date & time: 10/22/18  1057    History   Chief Complaint Chief Complaint  Patient presents with   Hip Pain    HPI Briana Dean is a 78 y.o. female.     Patient brought in by EMS.  Patient with complaint of left hip pain that radiates down her left leg for 1 day.  Denies any falls or recent injuries.  However patient febrile upon arrival here with a temp of 101.9.  Respiratory rate up a little bit at 20.  Oxygen saturations would vary from 88% to 92% on room air.  Blood pressure was good 112/94.  Patient has a longstanding history of chronic bilateral low back pain with bilateral sciatica.  Seen most recently for that in October 2019 by Dr. Romeo [facility] orth[facility].  Also known to have lumbar compression fractures.  Patient also followed by vascular surgery was seen by them on March 3 of this year she has had a previous thoracic aneurysm that had ruptured.  And repaired.  Otherwise past medical history is significant for acute on chronic respiratory failure with hypoxia hyperlipidemia hypertension combined systolic and diastolic heart failure.  Peripheral artery disease aneurysm of thoracic aorta.  And COPD and dissection of distal aorta.  And known to have an abdominal aortic aneurysm.  Patient was not aware that she had a fever.  Patient not giving any upper respiratory symptoms.  She just was concerned about the left hip pain.     Past Medical History:  Diagnosis Date   AAA (abdominal aortic aneurysm) (HCC)    Acute on chronic respiratory failure with hypoxia (HCC)    Anemia, unspecified    Aneurysm of thoracic aorta (HCC)    Anxiety disorder    Chronic combined systolic and diastolic heart failure (HCC)    Constipation    COPD (chronic obstructive pulmonary disease) (HCC)    Dissection of distal aorta (HCC)    Gastroesophageal reflux disease without esophagitis    Hyperlipidemia, unspecified     Hypertension, essential    Insomnia, unspecified    Low back pain    Major depressive disorder, single episode, unspecified    PAD (peripheral artery disease) (HCC)    Respiratory failure (HCC)    Restless leg syndrome    Unspecified protein-calorie malnutrition (HCC)    Weakness     Patient Active Problem List   Diagnosis Date Noted   Congestive heart failure (HCC) 12/04/2016   Ruptured aneurysm of thoracic aorta (HCC) 12/04/2016   Essential hypertension 12/04/2016    Past Surgical History:  Procedure Laterality Date   IR KYPHO EA ADDL LEVEL THORACIC OR LUMBAR  03/25/2018   IR KYPHO LUMBAR INC FX REDUCE BONE BX UNI/BIL CANNULATION INC/IMAGING  03/25/2018   THORACIC AORTIC ANEURYSM REPAIR  09/06/2016   TEVAR   TRACHEOSTOMY  08/2016   VIDEO ASSISTED THORACOSCOPY (VATS)/THOROCOTOMY  08/2016     OB History   No obstetric history on file.      Home Medications    Prior to Admission medications   Medication Sig Start Date End Date Taking? Authorizing Provider  aspirin EC 81 MG tablet Take 81 mg by mouth daily.    [provider]  atenolol (TENORMIN) 25 MG tablet Take 25 mg by mouth daily.    [provider]  atorvastatin (LIPITOR) 40 MG tablet Take 40 mg by mouth daily.    [provider]  carbidopa-levodopa (SINEMET IR)  25-100 MG tablet Take 1 tablet by mouth daily.    [provider]  ergocalciferol (VITAMIN D2) 50000 units capsule Take 50,000 Units by mouth once a week.    [provider]  famotidine (PEPCID) 20 MG tablet Take 20 mg by mouth daily.    [provider]  ferrous sulfate 324 (65 Fe) MG TBEC Take by mouth daily.    [provider]  furosemide (LASIX) 20 MG tablet Take 20 mg by mouth daily.    [provider]  Ipratropium-Albuterol (DUONEB IN) Inhale into the lungs 3 (three) times daily.    [provider]  lisinopril (PRINIVIL,ZESTRIL) 10 MG tablet TAKE 1 TABLET BY  MOUTH ONCE DAILY 12/06/17   Laqueta Linden, MD  Nutritional Supplements (RA MELATONIN/B-6 PO) Take 1 tablet by mouth at bedtime.    [provider]  PARoxetine (PAXIL) 20 MG tablet Take 20 mg by mouth daily.    [provider]  polyethylene glycol (MIRALAX / GLYCOLAX) packet Take 17 g by mouth daily as needed for mild constipation.     [provider]  rOPINIRole (REQUIP) 0.5 MG tablet Take 0.5 mg by mouth at bedtime. 2 tabs daily    [provider]  traMADol (ULTRAM) 50 MG tablet Take by mouth every 6 (six) hours as needed.    [provider]    Family History Family History  Problem Relation Age of Onset   Congenital heart disease Mother    Heart disease Mother    Peripheral vascular disease Father    Alcoholism Father    Pectus carinatum Son    Heart failure Son    Heart attack Son 84   COPD Sister     Social History Social History   Tobacco Use   Smoking status: Former Smoker    Packs/day: 1.00    Years: 60.00    Pack years: 60.00    Types: Cigarettes    Last attempt to quit: 08/30/2016    Years since quitting: 2.1   Smokeless tobacco: Never Used  Substance Use Topics   Alcohol use: No   Drug use: No     Allergies   Aspirin and Penicillins   Review of Systems Review of Systems  Constitutional: Negative for chills and fever.  HENT: Negative for congestion, rhinorrhea and sore throat.   Eyes: Negative for visual disturbance.  Respiratory: Negative for cough and shortness of breath.   Cardiovascular: Negative for chest pain and leg swelling.  Gastrointestinal: Negative for abdominal pain, diarrhea, nausea and vomiting.  Genitourinary: Negative for dysuria.  Musculoskeletal: Positive for arthralgias. Negative for back pain, neck pain and neck stiffness.  Skin: Negative for rash.  Neurological: Negative for dizziness, weakness, light-headedness, numbness and headaches.  Hematological: Does not  bruise/bleed easily.  Psychiatric/Behavioral: Negative for confusion.     Physical Exam Updated Vital Signs BP 139/89    Pulse 90    Temp (!) 101.9 F (38.8 C) (Rectal)    Resp (!) 22    Ht 1.702 m (5\' 7" )    Wt 77.1 kg    LMP  (LMP Unknown)    SpO2 90%    BMI 26.63 kg/m   Physical Exam Vitals signs and nursing note reviewed.  Constitutional:      General: She is not in acute distress.    Appearance: Normal appearance. She is well-developed.  HENT:     Head: Normocephalic and atraumatic.  Eyes:     Extraocular Movements: Extraocular  movements intact.     Conjunctiva/sclera: Conjunctivae normal.     Pupils: Pupils are equal, round, and reactive to light.  Neck:     Musculoskeletal: Normal range of motion and neck supple.  Cardiovascular:     Rate and Rhythm: Normal rate and regular rhythm.     Heart sounds: Normal heart sounds. No murmur.  Pulmonary:     Effort: Pulmonary effort is normal. No respiratory distress.     Breath sounds: Normal breath sounds.  Abdominal:     General: Bowel sounds are normal.     Palpations: Abdomen is soft.     Tenderness: There is no abdominal tenderness.  Musculoskeletal:        General: Swelling present.     Comments: Patient's left foot with good dorsiflexion and plantar flexion.  No gross numbness.  Cap refill is fine.  No pain with movement of the knee or ankle or toes.  Very mild bilateral swelling.  Skin:    General: Skin is warm and dry.  Neurological:     General: No focal deficit present.     Mental Status: She is alert and oriented to person, place, and time.     Cranial Nerves: No cranial nerve deficit.     Sensory: No sensory deficit.     Motor: No weakness.      ED Treatments / Results  Labs (all labs ordered are listed, but only abnormal results are displayed) Labs Reviewed  COMPREHENSIVE METABOLIC PANEL - Abnormal; Notable for the following components:      Result Value   Potassium 3.1 (*)    Glucose, Bld 122 (*)      Calcium 8.7 (*)    AST 11 (*)    Total Bilirubin 1.5 (*)    All other components within normal limits  CBC WITH DIFFERENTIAL/PLATELET - Abnormal; Notable for the following components:   RBC 3.75 (*)    Hemoglobin 11.5 (*)    HCT 35.2 (*)    Platelets 142 (*)    Neutro Abs 8.5 (*)    Lymphs Abs 0.4 (*)    Monocytes Absolute 1.2 (*)    All other components within normal limits  URINALYSIS, ROUTINE W REFLEX MICROSCOPIC - Abnormal; Notable for the following components:   APPearance HAZY (*)    All other components within normal limits  BRAIN NATRIURETIC PEPTIDE - Abnormal; Notable for the following components:   B Natriuretic Peptide 101.0 (*)    All other components within normal limits  CULTURE, BLOOD (ROUTINE X 2)  CULTURE, BLOOD (ROUTINE X 2)  SARS CORONAVIRUS 2 (HOSPITAL ORDER, PERFORMED IN [place] [facility])  LACTIC ACID, PLASMA  LACTIC ACID, PLASMA  TROPONIN I    EKG EKG Interpretation  Date/Time:  Saturday Oct 22 2018 11:26:17 EDT Ventricular Rate:  90 PR Interval:    QRS Duration: 129 QT Interval:  390 QTC Calculation: 478 R Axis:   -48 Text Interpretation:  Sinus rhythm Probable left atrial enlargement RBBB and LAFB Left ventricular hypertrophy Confirmed by Vanetta Mulders [phone]) on 10/22/2018 11:31:51 AM   Radiology Dg Chest Port 1 View  Result Date: 10/22/2018 CLINICAL DATA:  Fever EXAM: PORTABLE CHEST 1 VIEW COMPARISON:  Chest radiograph July 22, 2017 and chest CT July 27, 2018 FINDINGS: There is no edema or consolidation. Heart is slightly enlarged with pulmonary vascularity normal. There is a stent extending throughout most of the aorta, stable. No adenopathy. No bone lesions. IMPRESSION: Aortic stent graft, without appreciable demonstrable  change in position. Mild cardiac enlargement. No edema or consolidation. Electronically Signed   By: Bretta BangWilliam  Woodruff III M.D.   On: 10/22/2018 12:13    Procedures Procedures (including critical care  time)  CRITICAL CARE Performed by: Vanetta MuldersScott Jorgina Binning Total critical care time: 30 minutes Critical care time was exclusive of separately billable procedures and treating other patients. Critical care was necessary to treat or prevent imminent or life-threatening deterioration. Critical care was time spent personally by me on the following activities: development of treatment plan with patient and/or surrogate as well as nursing, discussions with consultants, evaluation of patient's response to treatment, examination of patient, obtaining history from patient or surrogate, ordering and performing treatments and interventions, ordering and review of laboratory studies, ordering and review of radiographic studies, pulse oximetry and re-evaluation of patient's condition.   Medications Ordered in ED Medications  metroNIDAZOLE (FLAGYL) IVPB 500 mg (500 mg Intravenous New Bag/Given 10/22/18 1414)  0.9 %  sodium chloride infusion (1,000 mLs Intravenous New Bag/Given 10/22/18 1309)  vancomycin (VANCOCIN) IVPB 1000 mg/200 mL premix (has no administration in time range)  ceFEPIme (MAXIPIME) 2 g in sodium chloride 0.9 % 100 mL IVPB (has no administration in time range)  vancomycin (VANCOCIN) IVPB 750 mg/150 ml premix (has no administration in time range)  ceFEPIme (MAXIPIME) 2 g in sodium chloride 0.9 % 100 mL IVPB (0 g Intravenous Stopped 10/22/18 1309)  vancomycin (VANCOCIN) IVPB 1000 mg/200 mL premix (0 mg Intravenous Stopped 10/22/18 1412)     Initial Impression / Assessment and Plan / ED Course  I have reviewed the triage vital signs and the nursing notes.  Pertinent labs & imaging results that were available during my care of the patient were reviewed by me and considered in my medical decision making (see chart for details).        On patient's vital signs sepsis protocol order set was activated.  Patient did not meet criteria for the 30 cc/kg fluid bolus.  Her lactic acid was not elevated.  She  was not hypotensive.  And no leukocytosis.  Patient however was started on broad-spectrum antibiotics for the elevated fever.  Also had chest x-ray done without any acute findings.  And COVID-19 testing was negative.  However patient still has fever.  Exact cause of that is not clear.  Urinalysis negative.  Blood cultures are pending.  Patient BNP was slightly elevated.  Patient has a known history of CHF in the past.  But chest x-ray without acute findings.  For the left hip pain which may just be sciatica.  Patient is going to have x-rays of the left hip.  And also since patient's had prior problems with thoracic and abdominal aneurysms patient will undergo CT angios of chest and abdomen for further evaluation.  Just due to the fever patient will probably require admission.  Is possible that if work-up is negative that there could be an infection in her spinal cord area and [facility].  For the mild hypokalemia patient given oral potassium. For the mild hypoxia patient started on 2 L nasal cannula.  Patient in no respiratory distress.    Final Clinical Impressions(s) / ED Diagnoses   Final diagnoses:  Fever, unspecified fever cause  Sepsis, due to unspecified organism, unspecified whether acute organ dysfunction present (HCC)  Covid-19 Virus not Detected  Hypoxia  Hypokalemia  Left hip pain    ED Discharge Orders    None  Vanetta Mulders, MD 10/22/18 1451    Vanetta Mulders, MD 10/22/18 [phone]

## 2018-10-22 NOTE — Progress Notes (Signed)
Pharmacy Antibiotic Note  Briana Dean is a 78 y.o. female admitted on 10/22/2018 with infection of unknown source.  Pharmacy has been consulted for vancomycin and cefepime dosing.  Plan: Start cefepime 2g IV q8h Loading dose:  vancomycin 2g (1g + 1g) Maintenance dose:  vancomycin 750mg  IV q12h Goal vancomycin trough range: 15-20   mcg/mL Pharmacy will continue to monitor renal function, vancomycin troughs as clinically appropriate, cultures and patient progress.    Height: 5\' 7"  (170.2 cm) Weight: 170 lb (77.1 kg) IBW/kg (Calculated) : 61.6  Temp (24hrs), Avg:101.9 F (38.8 C), Min:101.9 F (38.8 C), Max:101.9 F (38.8 C)  Recent Labs  Lab 10/22/18 1128 10/22/18 1328  WBC 10.1  --   CREATININE 0.61  --   LATICACIDVEN 0.8 0.6    Estimated Creatinine Clearance: 64 mL/min (by C-G formula based on SCr of 0.61 mg/dL).    Allergies  Allergen Reactions  . Aspirin Other (See Comments)    Stomach pain  . Penicillins     Has patient had a PCN reaction causing immediate rash, facial/tongue/throat swelling, SOB or lightheadedness with hypotension No Has patient had a PCN reaction causing severe rash involving mucus membranes or skin necrosis: No Has patient had a PCN reaction that required hospitalization: No Has patient had a PCN reaction occurring within the last 10 years: No If all of the above answers are "NO", then may proceed with Cephalosporin use.     Antimicrobials this admission: vancomycin 5/23 >>   cefepime 5/23 >>    Microbiology results: 5/23 [facility] x2:  NG <12h  5/23 SARS-CoV-2: negative      Thank you for allowing pharmacy to be a part of this patient's care.  Tama High 10/22/2018 2:35 PM

## 2018-10-23 LAB — CBC
HCT: 38.9 % (ref 36.0–46.0)
Hemoglobin: 12.3 g/dL (ref 12.0–15.0)
MCH: 30.1 pg (ref 26.0–34.0)
MCHC: 31.6 g/dL (ref 30.0–36.0)
MCV: 95.1 fL (ref 80.0–100.0)
Platelets: 161 10*3/uL (ref 150–400)
RBC: 4.09 MIL/uL (ref 3.87–5.11)
RDW: 14.7 % (ref 11.5–15.5)
WBC: 20.2 10*3/uL — ABNORMAL HIGH (ref 4.0–10.5)
nRBC: 0 % (ref 0.0–0.2)

## 2018-10-23 LAB — RESPIRATORY PANEL BY PCR

## 2018-10-23 LAB — BASIC METABOLIC PANEL
Anion gap: 14 (ref 5–15)
BUN: 11 mg/dL (ref 8–23)
CO2: 25 mmol/L (ref 22–32)
Calcium: 8.8 mg/dL — ABNORMAL LOW (ref 8.9–10.3)
Chloride: 97 mmol/L — ABNORMAL LOW (ref 98–111)
Creatinine, Ser: 0.6 mg/dL (ref 0.44–1.00)
GFR calc Af Amer: >60 mL/min (ref >60)
GFR calc non Af Amer: >60 mL/min (ref >60)
Glucose, Bld: 156 mg/dL — ABNORMAL HIGH (ref 70–99)
Potassium: 3.5 mmol/L (ref 3.5–5.1)
Sodium: 136 mmol/L (ref 135–145)

## 2018-10-23 LAB — MRSA PCR SCREENING: MRSA by PCR: NEGATIVE

## 2018-10-23 MED ORDER — MUSCLE RUB 10-15 % EX CREA
TOPICAL_CREAM | CUTANEOUS | Status: DC | PRN
  Administered 2018-10-23 – 2018-10-25 (×2): via TOPICAL
  Filled 2018-10-23: qty 85

## 2018-10-23 NOTE — Progress Notes (Signed)
CRITICAL VALUE ALERT  Critical Value:  Gram + cocci in blood cultures  Date & Time Notied:  10/22/18 @0045   Provider Notified: [place]   Orders Received/Actions taken: no further actions, currently on ABTs

## 2018-10-23 NOTE — Progress Notes (Signed)
PROGRESS NOTE    Briana Dean  YPP:[id number] DOB: [birth year] DOA: 10/22/2018 PCP: Samuel Jester, DO   Brief Narrative:  Per HPI: Briana Dean is a 78 y.o. 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  Patient presents with left hip pain that started 1 to 2 days ago and is located in her left buttock and radiates down leg.  Pain is 10 out of 10.  Pain is same location as her chronic hip and leg pain, but much more severe.  No recent falls, injuries.  Patient states that movement appears to improve her pain, as well as massage.  No provoking factors.  On arrival, the patient was noted to have a fever and hypoxia.  The patient was unaware of her fever, although admits to feeling cold.  Upon questioning, she does admit to some shortness of breath with exertion but is generally okay with rest.  Occasional intermittent cough that is nonproductive.  She denies abdominal pain, nausea, vomiting, diarrhea.  She continues to have low back pain and fever curve is improving.    Assessment & Plan:   Principal Problem:   Fever Active Problems:   Congestive heart failure (HCC)   Ruptured aneurysm of thoracic aorta (HCC)   Essential hypertension   Hypokalemia   Hip pain, left   Hypoxia  Fever secondary to gram + bacteremia and possible associated discitis/osteomyelitis -Await further ID/Sens of cultures -Likely MRI of lumbar/pelvis in am to evaluate osteo/discitis -Follow CBC -Continue current antibiotics with MRSA screen pending  Mild hypoxemia -Wean oxygen as tolerated -Up to chair/ IS -duonebs prn  Mild hypokalemia -Resolved -Recheck BMP in am -Continue oral supplementation with ongoing Lasix  Hypertension -Controlled; continue current medications  History of ruptured aneurysm -Stable  CHF  -Currently stable -Monitor daily weights  DVT prophylaxis:Lovenox Code Status: Full Family Communication: Will call family Disposition Plan: Continue current antibiotics and monitor cultures. MRI am. Will call ID for recommendations soon.   Consultants:   None  Procedures:   None  Antimicrobials:  Anti-infectives (From admission, onward)   Start     Dose/Rate Route Frequency Ordered Stop   10/23/18 2200  metroNIDAZOLE (FLAGYL) IVPB 500 mg     500 mg 100 mL/hr over 60 Minutes Intravenous Every 8 hours 10/22/18 2037     10/23/18 0400  vancomycin (VANCOCIN) IVPB 750 mg/150 ml premix     750 mg 150 mL/hr over 60 Minutes Intravenous Every 12 hours 10/22/18 1440     10/22/18 2200  ceFEPIme (MAXIPIME) 2 g in sodium chloride 0.9 % 100 mL IVPB     2 g 200 mL/hr over 30 Minutes Intravenous Every 8 hours 10/22/18 1440     10/22/18 1500  vancomycin (VANCOCIN) IVPB 1000 mg/200 mL premix     1,000 mg 200 mL/hr over 60 Minutes Intravenous  Once 10/22/18 1434 10/22/18 1922   10/22/18 1130  ceFEPIme (MAXIPIME) 2 g in sodium chloride 0.9 % 100 mL IVPB     2 g 200 mL/hr over 30 Minutes Intravenous  Once 10/22/18 1116 10/22/18 1309   10/22/18 1130  metroNIDAZOLE (FLAGYL) IVPB 500 mg     500 mg 100 mL/hr over 60 Minutes Intravenous  Once 10/22/18 1116 10/22/18 1515   10/22/18 1130  vancomycin (VANCOCIN) IVPB 1000 mg/200 mL premix     1,000 mg 200 mL/hr over 60 Minutes  Intravenous  Once 10/22/18 1116 10/22/18 1412        Subjective: Patient seen and evaluated today with no new acute complaints or concerns. No acute concerns or events noted overnight. She continues to have low back pain. Fevers have improved.  Objective: Vitals:   10/22/18 2016 10/23/18 0128 10/23/18 0557 10/23/18 0901  BP: 112/73  (!) 131/98   Pulse: (!) 101  92   Resp: 20  18   Temp: 100.2 F (37.9 C)  98 F (36.7 C)   TempSrc: Oral  Oral   SpO2: 97% 98% 100% 98%  Weight: 81.2 kg     Height: 5\' 7"  (1.702 m)        Intake/Output Summary (Last 24 hours) at 10/23/2018 0944 Last data filed at 10/23/2018 0500 Gross per 24 hour  Intake 2440 ml  Output 250 ml  Net 2190 ml   Filed Weights   10/22/18 1101 10/22/18 2016  Weight: 77.1 kg 81.2 kg    Examination:  General exam: Appears calm and comfortable  Respiratory system: Clear to auscultation. Respiratory effort normal. Currently on 2L [place]. Cardiovascular system: S1 & S2 heard, RRR. No JVD, murmurs, rubs, gallops or clicks. No pedal edema. Gastrointestinal system: Abdomen is nondistended, soft and nontender. No organomegaly or masses felt. Normal bowel sounds heard. Central nervous system: Alert and oriented. No focal neurological deficits. Extremities: Symmetric 5 x 5 power. Skin: No rashes, lesions or ulcers Psychiatry: Judgement and insight appear normal. Mood & affect appropriate.     Data Reviewed: I have personally reviewed following labs and imaging studies  CBC: Recent Labs  Lab 10/22/18 1128 10/23/18 0601  WBC 10.1 20.2*  NEUTROABS 8.5*  --   HGB 11.5* 12.3  HCT 35.2* 38.9  MCV 93.9 95.1  PLT 142* 161   Basic Metabolic Panel: Recent Labs  Lab 10/22/18 1128 10/23/18 0601  NA 137 136  K 3.1* 3.5  CL 98 97*  CO2 27 25  GLUCOSE 122* 156*  BUN 11 11  CREATININE 0.61 0.60  CALCIUM 8.7* 8.8*   GFR: Estimated Creatinine Clearance: 65.5 mL/min (by C-G formula based on SCr of 0.6 mg/dL). Liver Function Tests: Recent Labs  Lab 10/22/18 1128  AST 11*  ALT <5  ALKPHOS 69  BILITOT 1.5*  PROT 7.2  ALBUMIN 3.6   No results for input(s): LIPASE, AMYLASE in the last 168 hours. No results for input(s): AMMONIA in the last 168 hours. Coagulation Profile: No results for input(s): INR, PROTIME in the last 168 hours. Cardiac Enzymes: Recent Labs  Lab 10/22/18 1128  TROPONINI <0.03   BNP (last 3 results) No results for input(s): PROBNP in the last 8760 hours. HbA1C: No results for input(s): HGBA1C in the last 72  hours. CBG: No results for input(s): GLUCAP in the last 168 hours. Lipid Profile: No results for input(s): CHOL, HDL, LDLCALC, TRIG, CHOLHDL, LDLDIRECT in the last 72 hours. Thyroid Function Tests: No results for input(s): TSH, T4TOTAL, FREET4, T3FREE, THYROIDAB in the last 72 hours. Anemia Panel: No results for input(s): VITAMINB12, FOLATE, FERRITIN, TIBC, IRON, RETICCTPCT in the last 72 hours. Sepsis Labs: Recent Labs  Lab 10/22/18 1128 10/22/18 1328  LATICACIDVEN 0.8 0.6    Recent Results (from the past 240 hour(s))  Blood Culture (routine x 2)     Status: None (Preliminary result)   Collection Time: 10/22/18 11:29 AM  Result Value Ref Range Status   Specimen Description   Final    BLOOD LEFT ANTECUBITAL Performed at  [facility], [address] [place], [place] [address]    Special Requests   Final    BOTTLES DRAWN AEROBIC AND ANAEROBIC Blood Culture adequate volume Performed at [facility], [address], [place], [place] [address]    Culture  Setup Time   Final    GRAM POSITIVE COCCI Gram Stain Report Called to,Read Back By and Verified With: JOHNSON,B @ 0203 ON 10/23/18 BY JUW GS DONE @ [facility] Performed at [facility], [address], [place], [place] [address]    Culture [facility] POSITIVE COCCI  Final   Report Status PENDING  Incomplete  Blood Culture (routine x 2)     Status: None (Preliminary result)   Collection Time: 10/22/18 11:29 AM  Result Value Ref Range Status   Specimen Description   Final    BLOOD BLOOD RIGHT WRIST Performed at [facility], [address] [place], [place] [address]    Special Requests   Final    BOTTLES DRAWN AEROBIC AND ANAEROBIC Blood Culture adequate volume Performed at [facility], [address], [place], [place] [address]    Culture  Setup Time   Final    GRAM POSITIVE COCCI Gram Stain Report Called to,Read Back By and Verified With: GRAVES,K @ 0042 ON 10/23/18 BY JUW GS DONE @ [facility] Performed at [facility], [address],  [place], [place] [address]    Culture [facility] POSITIVE COCCI  Final   Report Status PENDING  Incomplete  SARS Coronavirus 2 (CEPHEID- Performed in [facility] hospital lab), Hosp Order     Status: None   Collection Time: 10/22/18 12:37 PM  Result Value Ref Range Status   SARS Coronavirus 2 NEGATIVE NEGATIVE Final    Comment: (NOTE) If result is NEGATIVE SARS-CoV-2 target nucleic acids are NOT DETECTED. The SARS-CoV-2 RNA is generally detectable in upper and lower  respiratory specimens during the acute phase of infection. The lowest  concentration of SARS-CoV-2 viral copies this assay can detect is 250  copies / mL. A negative result does not preclude SARS-CoV-2 infection  and should not be used as the sole basis for treatment or other  patient management decisions.  A negative result may occur with  improper specimen collection / handling, submission of specimen other  than nasopharyngeal swab, presence of viral mutation(s) within the  areas targeted by this assay, and inadequate number of viral copies  (<250 copies / mL). A negative result must be combined with clinical  observations, patient history, and epidemiological information. If result is POSITIVE SARS-CoV-2 target nucleic acids are DETECTED. The SARS-CoV-2 RNA is generally detectable in upper and lower  respiratory specimens dur ing the acute phase of infection.  Positive  results are indicative of active infection with SARS-CoV-2.  Clinical  correlation with patient history and other diagnostic information is  necessary to determine patient infection status.  Positive results do  not rule out bacterial infection or co-infection with other viruses. If result is PRESUMPTIVE POSTIVE SARS-CoV-2 nucleic acids MAY BE PRESENT.   A presumptive positive result was obtained on the submitted specimen  and confirmed on repeat testing.  While 2019 novel coronavirus  (SARS-CoV-2) nucleic acids may be present in the submitted sample   additional confirmatory testing may be necessary for epidemiological  and / or clinical management purposes  to differentiate between  SARS-CoV-2 and other Sarbecovirus currently known to infect humans.  If clinically indicated additional testing with an alternate test  methodology [id number]) is advised. The SARS-CoV-2 RNA is  generally  detectable in upper and lower respiratory sp ecimens during the acute  phase of infection. The expected result is Negative. Fact Sheet for Patients:  BoilerBrush.com.cyhttps://www.fda.gov/media/136312/download Fact Sheet for Healthcare Providers: https://pope.com/https://www.fda.gov/media/136313/download This test is not yet approved or cleared by the [place] FDA and has been authorized for detection and/or diagnosis of SARS-CoV-2 by FDA under an Emergency Use Authorization (EUA).  This EUA will remain in effect (meaning this test can be used) for the duration of the COVID-19 declaration under Section 564(b)(1) of the Act, 21 U.S.C. section 360bbb-3(b)(1), unless the authorization is terminated or revoked sooner. Performed at [facility], [address] [place], [place] [address]          Radiology Studies: [facility]  Result Date: 10/22/2018 CLINICAL DATA:  Fever EXAM: PORTABLE CHEST 1 VIEW COMPARISON:  Chest radiograph July 22, 2017 and chest CT July 27, 2018 FINDINGS: There is no edema or consolidation. Heart is slightly enlarged with pulmonary vascularity normal. There is a stent extending throughout most of the aorta, stable. No adenopathy. No bone lesions. IMPRESSION: Aortic stent graft, without appreciable demonstrable change in position. Mild cardiac enlargement. No edema or consolidation. Electronically Signed   By: Bretta BangWilliam  Woodruff III M.D.   On: 10/22/2018 12:13   Ct No Charge  Result Date: 10/22/2018 CLINICAL DATA:  78 year old female with a history of left hip pain. 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 EXAM: CT ANGIOGRAPHY CHEST,  ABDOMEN AND PELVIS TECHNIQUE: Multidetector CT imaging through the chest, abdomen and pelvis was performed using the standard protocol during bolus administration of intravenous contrast. Multiplanar reconstructed images and MIPs were obtained and reviewed to evaluate the vascular anatomy. CONTRAST:  100mL OMNIPAQUE IOHEXOL 350 MG/ML SOLN COMPARISON:  Multiple prior most recent 07/27/2018, 02/01/2018, 07/22/2017, 03/15/2017 FINDINGS: CTA CHEST FINDINGS Cardiovascular: Cardiomegaly. No pericardial fluid/thickening. Calcifications of left anterior descending, circumflex, right coronary arteries. Minimal calcifications of the aortic valve. Ascending aorta within normal limits. Redemonstration of overlapping stent grafts of the aortic arch and descending thoracic aorta for endovascular solution for acute aortic syndrome. The proximal margin of the stent graft is unchanged. Branch vessels remain patent. The left subclavian artery appears patent at the proximal aspect, either from antegrade flow or collateral perfusion. The stent graft terminates in the thoracic aorta just above the hiatus. Diameter at the the hiatus measures 3.4 cm. Main pulmonary artery measures 4.1 cm. No filling defects, although the timing of the contrast bolus is suboptimal. Mediastinum/Nodes: No mediastinal adenopathy. Unremarkable thoracic inlet. There is debris within the mid and distal thoracic esophagus. Lungs/Pleura: Centrilobular emphysema. No pneumothorax. Linear scarring/atelectasis at the lung bases. No confluent airspace disease. Redemonstration of left lower lobe calcified granulomas. 4 mm nodule in the right upper lobe periphery on image 72 of series 7. 5 mm nodule in the right lower lobe on image 83 of series 7. No endotracheal or endobronchial debris. Musculoskeletal: No displaced fracture. Degenerative changes of the spine. No bony canal narrowing. Review of the MIP images confirms the above findings. CTA ABDOMEN AND PELVIS FINDINGS  VASCULAR Aorta: Atherosclerosis of the abdominal aorta. The juxtarenal aorta measures 2.6 cm. Largest diameter of the infrarenal abdominal aorta measures 31 mm, unchanged from prior. 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 Greatest diameter at this site measures 32 mm, slightly more than the baseline study of 03/15/2017 and no significant change from the comparison CT. No inflammatory changes or fluid. Celiac: Celiac artery remains patent,  compressed at the origin with a configuration most compatible with overlying diaphragmatic cruise. SMA: No significant atherosclerosis at the SMA origin. Renals: Minimal atherosclerosis of the left renal artery. Minimal atherosclerosis of the right main renal artery. Small accessory right renal artery above the main renal artery and a small accessory renal artery below the main right renal artery. IMA: IMA remains patent, originating from the anterior aspect of the chronic dissection/irregular plaque Right lower extremity: Atherosclerotic changes of the right iliac system without occlusion or high-grade stenosis. Hypogastric artery remains patent. Common femoral artery with minimal atherosclerosis. Proximal profunda femoris and SFA patent. Left lower extremity: Atherosclerotic changes of the left iliac system without high-grade stenosis or occlusion. Hypogastric artery is patent. Common femoral artery with minimal atherosclerosis. Proximal profunda femoris and SFA patent. Veins: Unremarkable appearance of the venous system. Review of the MIP images confirms the above findings. NON-VASCULAR Hepatobiliary: Unremarkable appearance of the liver. Minimal calcified cholelithiasis within the dependent gallbladder. No inflammatory changes Pancreas: Unremarkable Spleen: Coarse calcifications of the spleen representing prior granulomatous disease Adrenals/Urinary Tract: Unremarkable left adrenal gland. Adrenal adenoma of the right adrenal  gland. Right: Symmetric perfusion to the left kidney. No hydronephrosis. No nephrolithiasis. Unremarkable right ureter. Small lesion on the lateral cortex may represent hemorrhagic cyst, incompletely characterized on the current study. Left: Symmetric perfusion to the right. No hydronephrosis. Nonobstructing stone at the inferior collecting system. Cyst at the superior cortex of the left kidney. Additional small lesions of the left kidney are too small to characterize. Unremarkable urinary bladder. Stomach/Bowel: Hiatal hernia. Unremarkable stomach. Unremarkable appearance of small bowel. No evidence of obstruction. Surgical changes of appendectomy. Colonic diverticula with no acute inflammatory changes. Lymphatic: No adenopathy. Mesenteric: No free fluid or air. No adenopathy. Reproductive: Hysterectomy Other: Surgical clips along the midline abdomen of prior surgery. No abdominal hernia Musculoskeletal: Scoliotic curvature of the thoracolumbar spine. Changes of vertebral augmentation of L3 and L4. Degenerative changes of the thoracolumbar spine. No acute displaced fracture. Left lower extremity/hip: Mild degenerative changes of the left sacroiliac joint with no acute displaced fracture. No pelvic fracture identified. Left hip is congruent with no subluxation/dislocation. Mild joint space narrowing symmetric to the right. No femoral fracture identified. No left-sided joint effusion. Unremarkable appearance of the left hip musculature with no focal soft tissue swelling. No soft tissue lesion. No lymphadenopathy. IMPRESSION: No acute CT finding of the chest, abdomen, pelvis, or left hip. Redemonstration of endovascular repair of thoracic aorta with overlapping stent grafts of the aortic arch and descending thoracic aorta. 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 The aorta just above this plaque/dissection  measures also 3.1 cm, unchanged. Aortic atherosclerosis. Aortic Atherosclerosis (ICD10-I70.0). Associated coronary artery disease. Enlarged main pulmonary artery suggesting early pulmonary hypertension. Additional ancillary findings as above. Signed, Yvone Neu. Reyne Dumas, RPVI [facility] Radiology Electronically Signed   By: Gilmer Mor D.O.   On: 10/22/2018 16:35   Ct Angio Chest/abd/pel For Dissection W And/or W/wo  Result Date: 10/22/2018 CLINICAL DATA:  78 year old female with a history of left hip pain. 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 EXAM: CT ANGIOGRAPHY CHEST, ABDOMEN AND PELVIS TECHNIQUE: Multidetector CT imaging through the chest, abdomen and pelvis was performed using the standard protocol during bolus administration of intravenous contrast. Multiplanar reconstructed images and MIPs were obtained and reviewed to evaluate the vascular anatomy. CONTRAST:  OMNIPAQUE IOHEXOL 350 MG/ML SOLN COMPARISON:  Multiple prior most recent 07/27/2018, 02/01/2018, 07/22/2017, 03/15/2017 FINDINGS: CTA CHEST FINDINGS Cardiovascular: Cardiomegaly. No pericardial fluid/thickening. Calcifications of left anterior descending, circumflex, right coronary arteries. Minimal calcifications of the aortic valve. Ascending aorta within normal limits. Redemonstration of overlapping stent grafts of the aortic arch and descending thoracic aorta for endovascular solution for acute aortic syndrome. The proximal margin of the stent graft is unchanged. Branch vessels remain patent. The left subclavian artery appears patent at the proximal aspect, either from antegrade flow or collateral perfusion. The stent graft terminates in the thoracic aorta just above the hiatus. Diameter at the the hiatus measures 3.4 cm. Main pulmonary artery measures 4.1 cm. No filling defects, although the timing of the contrast bolus is suboptimal. Mediastinum/Nodes: No mediastinal  adenopathy. Unremarkable thoracic inlet. There is debris within the mid and distal thoracic esophagus. Lungs/Pleura: Centrilobular emphysema. No pneumothorax. Linear scarring/atelectasis at the lung bases. No confluent airspace disease. Redemonstration of left lower lobe calcified granulomas. 4 mm nodule in the right upper lobe periphery on image 72 of series 7. 5 mm nodule in the right lower lobe on image 83 of series 7. No endotracheal or endobronchial debris. Musculoskeletal: No displaced fracture. Degenerative changes of the spine. No bony canal narrowing. Review of the MIP images confirms the above findings. CTA ABDOMEN AND PELVIS FINDINGS VASCULAR Aorta: Atherosclerosis of the abdominal aorta. The juxtarenal aorta measures 2.6 cm. Largest diameter of the infrarenal abdominal aorta measures 31 mm, unchanged from prior. 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 Greatest diameter at this site measures 32 mm, slightly more than the baseline study of 03/15/2017 and no significant change from the comparison CT. No inflammatory changes or fluid. Celiac: Celiac artery remains patent, compressed at the origin with a configuration most compatible with overlying diaphragmatic cruise. SMA: No significant atherosclerosis at the SMA origin. Renals: Minimal atherosclerosis of the left renal artery. Minimal atherosclerosis of the right main renal artery. Small accessory right renal artery above the main renal artery and a small accessory renal artery below the main right renal artery. IMA: IMA remains patent, originating from the anterior aspect of the chronic dissection/irregular plaque Right lower extremity: Atherosclerotic changes of the right iliac system without occlusion or high-grade stenosis. Hypogastric artery remains patent. Common femoral artery with minimal atherosclerosis. Proximal profunda femoris and SFA patent. Left lower extremity: Atherosclerotic changes of the  left iliac system without high-grade stenosis or occlusion. Hypogastric artery is patent. Common femoral artery with minimal atherosclerosis. Proximal profunda femoris and SFA patent. Veins: Unremarkable appearance of the venous system. Review of the MIP images confirms the above findings. NON-VASCULAR Hepatobiliary: Unremarkable appearance of the liver. Minimal calcified cholelithiasis within the dependent gallbladder. No inflammatory changes Pancreas: Unremarkable Spleen: Coarse calcifications of the spleen representing prior granulomatous disease Adrenals/Urinary Tract: Unremarkable left adrenal gland. 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 Right: Symmetric perfusion to the left kidney. No hydronephrosis. No nephrolithiasis. Unremarkable right ureter. Small lesion on the lateral cortex may represent hemorrhagic cyst, incompletely characterized on the current study. Left: Symmetric perfusion to the right. No hydronephrosis. Nonobstructing stone at the inferior collecting system. Cyst at the superior cortex of the left kidney. Additional small lesions of the left kidney are too small to characterize. Unremarkable urinary bladder. Stomach/Bowel: Hiatal hernia. Unremarkable stomach. Unremarkable appearance of small bowel. No evidence of obstruction. Surgical changes of appendectomy. Colonic diverticula with no acute inflammatory changes. Lymphatic: No adenopathy. Mesenteric: No free fluid or air. No adenopathy. Reproductive: Hysterectomy  Other: Surgical clips along the midline abdomen of prior surgery. No abdominal hernia Musculoskeletal: Scoliotic curvature of the thoracolumbar spine. Changes of vertebral augmentation of L3 and L4. Degenerative changes of the thoracolumbar spine. No acute displaced fracture. Left lower extremity/hip: Mild degenerative changes of the left sacroiliac joint with no acute displaced fracture. No pelvic fracture identified. Left hip is congruent with no subluxation/dislocation. Mild  joint space narrowing symmetric to the right. No femoral fracture identified. No left-sided joint effusion. Unremarkable appearance of the left hip musculature with no focal soft tissue swelling. No soft tissue lesion. No lymphadenopathy. IMPRESSION: No acute CT finding of the chest, abdomen, pelvis, or left hip. Redemonstration of endovascular repair of thoracic aorta with overlapping stent grafts of the aortic arch and descending thoracic aorta. 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 The aorta just above this plaque/dissection measures also 3.1 cm, unchanged. Aortic atherosclerosis. Aortic Atherosclerosis (ICD10-I70.0). Associated coronary artery disease. Enlarged main pulmonary artery suggesting early pulmonary hypertension. Additional ancillary findings as above. Signed, Yvone Neu. Reyne Dumas, RPVI [facility] Electronically Signed   By: Gilmer Mor D.O.   On: 10/22/2018 16:35        Scheduled Meds: . albuterol  3 mL Nebulization Q6H WA  . aspirin EC  81 mg Oral Daily  . atenolol  25 mg Oral BID  . atorvastatin  40 mg Oral Daily  . carbidopa-levodopa  1 tablet Oral Daily  . enoxaparin (LOVENOX) injection  40 mg Subcutaneous Q24H  . famotidine  20 mg Oral Daily  . furosemide  20 mg Oral Daily  . lisinopril  10 mg Oral Daily  . mometasone-formoterol  2 puff Inhalation BID  . PARoxetine  20 mg Oral Daily  . potassium chloride  30 mEq Oral BID  . rOPINIRole  0.5 mg Oral q morning - 10a  . rOPINIRole  1 mg Oral QHS   Continuous Infusions: . ceFEPime (MAXIPIME) IV 2 g (10/23/18 0616)  . metronidazole    . vancomycin 750 mg (10/23/18 0340)     LOS: 1 day    Time spent: 30 minutes    Levoy Geisen Hoover Brunette, DO [facility] Pager [phone]  If 7PM-7AM, please contact night-coverage [email or web address] Password [email or web address]  10/23/2018, 9:44 AM

## 2018-10-24 ENCOUNTER — Inpatient Hospital Stay (HOSPITAL_COMMUNITY): Payer: Medicare HMO

## 2018-10-24 DIAGNOSIS — R7881 Bacteremia: Secondary | ICD-10-CM

## 2018-10-24 LAB — BASIC METABOLIC PANEL
Anion gap: 7 (ref 5–15)
BUN: 18 mg/dL (ref 8–23)
CO2: 30 mmol/L (ref 22–32)
Calcium: 9.4 mg/dL (ref 8.9–10.3)
Chloride: 100 mmol/L (ref 98–111)
Creatinine, Ser: 0.65 mg/dL (ref 0.44–1.00)
GFR calc Af Amer: >60 mL/min (ref >60)
GFR calc non Af Amer: >60 mL/min (ref >60)
Glucose, Bld: 118 mg/dL — ABNORMAL HIGH (ref 70–99)
Potassium: 4.5 mmol/L (ref 3.5–5.1)
Sodium: 137 mmol/L (ref 135–145)

## 2018-10-24 LAB — CBC
HCT: 36.4 % (ref 36.0–46.0)
Hemoglobin: 11.5 g/dL — ABNORMAL LOW (ref 12.0–15.0)
MCH: 30.5 pg (ref 26.0–34.0)
MCHC: 31.6 g/dL (ref 30.0–36.0)
MCV: 96.6 fL (ref 80.0–100.0)
Platelets: 158 10*3/uL (ref 150–400)
RBC: 3.77 MIL/uL — ABNORMAL LOW (ref 3.87–5.11)
RDW: 15 % (ref 11.5–15.5)
WBC: 11.9 10*3/uL — ABNORMAL HIGH (ref 4.0–10.5)
nRBC: 0 % (ref 0.0–0.2)

## 2018-10-24 LAB — ECHOCARDIOGRAM COMPLETE
Height: 67 in
Weight: 2881.85 oz

## 2018-10-24 MED ORDER — ADULT MULTIVITAMIN W/MINERALS CH
1.0000 | ORAL_TABLET | Freq: Every day | ORAL | Status: DC
  Administered 2018-10-25 – 2018-10-28 (×3): 1 via ORAL
  Filled 2018-10-24 (×3): qty 1

## 2018-10-24 MED ORDER — ENSURE ENLIVE PO LIQD
237.0000 mL | Freq: Two times a day (BID) | ORAL | Status: DC
  Administered 2018-10-24 – 2018-10-28 (×7): 237 mL via ORAL

## 2018-10-24 NOTE — Evaluation (Signed)
Physical Therapy Evaluation Patient Details Name: Briana Dean MRN: [id number] DOB: 1941/03/18 Today's Date: 10/24/2018   History of Present Illness  Briana Dean is a 78 y.o. 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  Patient presents with left hip pain that started 1 to 2 days ago and is located in her left buttock and radiates down leg.  Pain is 10 out of 10.  Pain is same location as her chronic hip and leg pain, but much more severe.  No recent falls, injuries.  Patient states that movement appears to improve her pain, as well as massage.  No provoking factors.  On arrival, the patient was noted to have a fever and hypoxia. The patient was unaware of her fever, although admits to feeling cold. Upon questioning, she does admit to some shortness of breath with exertion but is generally okay with rest. Occasional intermittent cough that is nonproductive.   Clinical Impression  Briana Dean is a 78 y.o. presenting for PT evaluation with recent decrease in functional mobility secondary to exacerbation of Lt hip pain. She is currently functioning below her baseline of independent with rollator, and now requires supervision to min guard assist for transfers/gait with use of RW. She was able to ambulate a short distance to the bathroom with min gaurd and performed toilet hygiene independently. Patient was limited by pain in Lt hip and did not want to ambulate out of hospital room. She was agreeable to sit up in bedside recliner through lunch and was instructed to call for assistance to get up. Patient was educated on benefits of outpatient physical therapy for chronic back or hip pain and she expressed interest in participating in this when she returns home. She will benefit from skilled PT to address current impairments  and from follow up PT at below venue to address mobility and hip pain. Acute PT will follow.     Follow Up Recommendations Outpatient PT    Equipment Recommendations  None recommended by PT    Recommendations for Other Services       Precautions / Restrictions Precautions Precautions: Fall Restrictions Weight Bearing Restrictions: No      Mobility  Bed Mobility Overal bed mobility: Independent       Transfers Overall transfer level: Needs assistance   Transfers: Sit to/from Stand Sit to Stand: Supervision    Ambulation/Gait Ambulation/Gait assistance: Min guard Gait Distance (Feet): 15 Feet Assistive device: Rolling walker (2 wheeled) Gait Pattern/deviations: Step-through pattern;Decreased step length - right;Decreased stance time - left;Decreased stride length;Narrow base of support Gait velocity: slow indicative of fall risk   General Gait Details: Rt LE externally rotated      Balance Overall balance assessment: Needs assistance Sitting-balance support: Single extremity supported;Feet supported Sitting balance-Leahy Scale: Fair Sitting balance - Comments: pt able to perform toileting hygeine with UE support on grab bar     Standing balance-Leahy Scale: Fair Standing balance comment: pt requires external support to maintain balance during gait, able to stand at sink and perform hand hygiene with supervision          Pertinent Vitals/Pain Pain Assessment: 0-10 Pain Score: 8  Pain Location: Lt hip/buttock Pain Descriptors / Indicators: Aching;Sore Pain Intervention(s): Limited activity within patient's tolerance;Monitored during session    Home Living Family/patient expects to be discharged to:: Private residence Living Arrangements: Other relatives(lives with  daughter in law ) Available Help at Discharge: Family Type of Home: House Home Access: Stairs to enter Entrance Stairs-Rails: None Entrance Stairs-Number of Steps: 3 Home Layout: One  level;Laundry or work area in basement;Able to live on main level with bedroom/bathroom Home Equipment: Environmental consultantWalker - 2 wheels;Walker - 4 wheels;Cane - single point Additional Comments: pt primarily uses rollator    Prior Function Level of Independence: Needs assistance   Gait / Transfers Assistance Needed: uses rollator for household mobility  ADL's / Homemaking Assistance Needed: pt is limited with standing duration to complete household cleaning, cooking, reprots her daughter in-law helps        Hand Dominance   Dominant Hand: Right    Extremity/Trunk Assessment   Upper Extremity Assessment Upper Extremity Assessment: Overall WFL for tasks assessed    Lower Extremity Assessment Lower Extremity Assessment: Overall WFL for tasks assessed    Cervical / Trunk Assessment Cervical / Trunk Assessment: Kyphotic  Communication   Communication: No difficulties  Cognition Arousal/Alertness: Awake/alert Behavior During Therapy: WFL for tasks assessed/performed Overall Cognitive Status: Within Functional Limits for tasks assessed              Assessment/Plan    PT Assessment Patient needs continued PT services  PT Problem List Decreased strength;Decreased range of motion;Decreased activity tolerance;Decreased balance;Decreased mobility;Pain       PT Treatment Interventions DME instruction;Therapeutic exercise;Gait training;Balance training;Stair training;Therapeutic activities;Functional mobility training;Patient/family education;Neuromuscular re-education    PT Goals (Current goals can be found in the Care Plan section)  Acute Rehab PT Goals Patient Stated Goal: no goals specifically stated by patient, patient does watn to get back home and is interested in outpatient PT services PT Goal Formulation: With patient Time For Goal Achievement: 10/31/18 Potential to Achieve Goals: Good    Frequency Min 2X/week   Barriers to discharge Decreased caregiver support pt reports her  daughter-in-law  has not been providing as much assistance as is needed for her at home.        AM-PAC PT "6 Clicks" Mobility  Outcome Measure Help needed turning from your back to your side while in a flat bed without using bedrails?: None Help needed moving from lying on your back to sitting on the side of a flat bed without using bedrails?: None Help needed moving to and from a bed to a chair (including a wheelchair)?: A Little Help needed standing up from a chair using your arms (e.g., wheelchair or bedside chair)?: A Little Help needed to walk in hospital room?: A Little Help needed climbing 3-5 steps with a railing? : A Lot 6 Click Score: 19    End of Session Equipment Utilized During Treatment: Gait belt Activity Tolerance: Patient tolerated treatment well Patient left: in chair;with call bell/phone within reach Nurse Communication: Mobility status PT Visit Diagnosis: Unsteadiness on feet (R26.81);Difficulty in walking, not elsewhere classified (R26.2);Other abnormalities of gait and mobility (R26.89);Pain Pain - Right/Left: Left Pain - part of body: Hip    Time: [id number] PT Time Calculation (min) (ACUTE ONLY): 31 min   Charges:   PT Evaluation $PT Eval Moderate Complexity: 1 Mod PT Treatments $Therapeutic Activity: 8-22 mins        Valentino Saxonachel Quinn-Brown, PT, DPT, [facility] Physical Therapist with  [facility]  10/24/2018 11:58 AM

## 2018-10-24 NOTE — Care Management Important Message (Signed)
Important Message  Patient Details  Name: Briana Dean MRN: [id number] Date of Birth: 17-Jul-1940   Medicare Important Message Given:  Yes(given to nurse to place at bedside)    Corey Harold 10/24/2018, 12:02 PM

## 2018-10-24 NOTE — Progress Notes (Addendum)
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 Patient presents with left hip pain that started 1 to 2 days ago and is located in her left buttock and radiates down leg. Pain is 10 out of 10. Pain is same location as her chronic hip and leg pain, but much more severe. No recent falls, injuries. Patient states that movement appears to improve her pain, as well as massage. No provoking factors.  On arrival, the patient was noted to have a fever and hypoxia. The patient was unaware of her fever, although admits to feeling cold. Upon questioning, she does admit to some shortness of breath with exertion but is generally okay with rest. Occasional intermittent cough that is nonproductive.  She denies abdominal pain, nausea, vomiting, diarrhea.  She continues to have low back pain and fever curve is improving.   Assessment & Plan:   Principal Problem:   Fever Active Problems:   Congestive heart failure (HCC)   Ruptured aneurysm of thoracic aorta (HCC)   Essential hypertension   Hypokalemia   Hip pain, left   Hypoxia   Fever secondary to staph aureus bacteremia and possible associated discitis/osteomyelitis -Await further sensitivities and maintain on current antibiotics -MRI of lumbar spine to evaluate for vertebral osteomyelitis and/or discitis -2D transthoracic echocardiogram with potential need for cardiology consult in a.m. given findings for potential TEE -Follow CBC -MRSA negative  Mild hypoxemia-improving -Wean oxygen as tolerated -Up to chair/ IS -duonebs prn  Mild hypokalemia-resolved -Resolved -Recheck BMP in am -Hold oral  supplementation for now  Hypertension -Controlled; continue current medications  History of ruptured aneurysm -Stable  CHF -Currently stable -Monitor daily weights-currently stable  DVT prophylaxis:Lovenox Code Status: Full Family Communication: Daughter in law Disposition Plan: Continue current antibiotics and monitor cultures.  MRI and 2D echocardiogram ordered for today to further evaluate sources of bacteremia.  May require cardiology consultation in a.m.   Consultants:   None  Procedures:   None  Antimicrobials:  Anti-infectives (From admission, onward)   Start     Dose/Rate Route Frequency Ordered Stop   10/23/18 2200  metroNIDAZOLE (FLAGYL) IVPB 500 mg     500 mg 100 mL/hr over 60 Minutes Intravenous Every 8 hours 10/22/18 2037     10/23/18 0400  vancomycin (VANCOCIN) IVPB 750 mg/150 ml premix     750 mg 150 mL/hr over 60 Minutes Intravenous Every 12 hours 10/22/18 1440     10/22/18 2200  ceFEPIme (MAXIPIME) 2 g in sodium chloride 0.9 % 100 mL IVPB     2 g 200 mL/hr over 30 Minutes Intravenous Every 8 hours 10/22/18 1440     10/22/18 1500  vancomycin (VANCOCIN) IVPB 1000 mg/200 mL premix     1,000 mg 200 mL/hr over 60 Minutes Intravenous  Once 10/22/18 1434 10/22/18 1922   10/22/18 1130  ceFEPIme (MAXIPIME) 2 g in sodium chloride 0.9 % 100 mL IVPB     2 g 200 mL/hr over 30 Minutes Intravenous  Once 10/22/18 1116 10/22/18 1309   10/22/18 1130  metroNIDAZOLE (FLAGYL) IVPB 500 mg     500 mg 100 mL/hr over 60 Minutes Intravenous  Once 10/22/18 1116 10/22/18 1515   10/22/18 1130  vancomycin (  VANCOCIN) IVPB 1000 mg/200 mL premix     1,000 mg 200 mL/hr over 60 Minutes Intravenous  Once 10/22/18 1116 10/22/18 1412       Subjective: Patient seen and evaluated today with no new acute complaints or concerns. No acute concerns or events noted overnight.  She states that her back pain has improved some today.  Objective: Vitals:   10/23/18 2006 10/23/18  2053 10/24/18 0637 10/24/18 0739  BP:  (!) 146/74 (!) 144/67   Pulse: 71 93 77   Resp: 18 20 20    Temp:  97.8 F (36.6 C) 98.5 F (36.9 C)   TempSrc:  Oral Oral   SpO2: 95% 100% 100% 99%  Weight:   81.7 kg   Height:        Intake/Output Summary (Last 24 hours) at 10/24/2018 1234 Last data filed at 10/24/2018 0900 Gross per 24 hour  Intake 710 ml  Output 500 ml  Net 210 ml   Filed Weights   10/22/18 1101 10/22/18 2016 10/24/18 0637  Weight: 77.1 kg 81.2 kg 81.7 kg    Examination:  General exam: Appears calm and comfortable  Respiratory system: Clear to auscultation. Respiratory effort normal.  Currently on nasal cannula oxygen. Cardiovascular system: S1 & S2 heard, RRR. No JVD, murmurs, rubs, gallops or clicks. No pedal edema. Gastrointestinal system: Abdomen is nondistended, soft and nontender. No organomegaly or masses felt. Normal bowel sounds heard. Central nervous system: Alert and oriented. No focal neurological deficits. Extremities: Symmetric 5 x 5 power. Skin: No rashes, lesions or ulcers Psychiatry: Judgement and insight appear normal. Mood & affect appropriate.     Data Reviewed: I have personally reviewed following labs and imaging studies  CBC: Recent Labs  Lab 10/22/18 1128 10/23/18 0601 10/24/18 0554  WBC 10.1 20.2* 11.9*  NEUTROABS 8.5*  --   --   HGB 11.5* 12.3 11.5*  HCT 35.2* 38.9 36.4  MCV 93.9 95.1 96.6  PLT 142* 161 158   Basic Metabolic Panel: Recent Labs  Lab 10/22/18 1128 10/23/18 0601 10/24/18 0554  NA 137 136 137  K 3.1* 3.5 4.5  CL 98 97* 100  CO2 27 25 30   GLUCOSE 122* 156* 118*  BUN 11 11 18   CREATININE 0.61 0.60 0.65  CALCIUM 8.7* 8.8* 9.4   GFR: Estimated Creatinine Clearance: 65.7 mL/min (by C-G formula based on SCr of 0.65 mg/dL). Liver Function Tests: Recent Labs  Lab 10/22/18 1128  AST 11*  ALT <5  ALKPHOS 69  BILITOT 1.5*  PROT 7.2  ALBUMIN 3.6   No results for input(s): LIPASE, AMYLASE in the last 168  hours. No results for input(s): AMMONIA in the last 168 hours. Coagulation Profile: No results for input(s): INR, PROTIME in the last 168 hours. Cardiac Enzymes: Recent Labs  Lab 10/22/18 1128  TROPONINI <0.03   BNP (last 3 results) No results for input(s): PROBNP in the last 8760 hours. HbA1C: No results for input(s): HGBA1C in the last 72 hours. CBG: No results for input(s): GLUCAP in the last 168 hours. Lipid Profile: No results for input(s): CHOL, HDL, LDLCALC, TRIG, CHOLHDL, LDLDIRECT in the last 72 hours. Thyroid Function Tests: No results for input(s): TSH, T4TOTAL, FREET4, T3FREE, THYROIDAB in the last 72 hours. Anemia Panel: No results for input(s): VITAMINB12, FOLATE, FERRITIN, TIBC, IRON, RETICCTPCT in the last 72 hours. Sepsis Labs: Recent Labs  Lab 10/22/18 1128 10/22/18 1328  LATICACIDVEN 0.8 0.6    Recent Results (from the past 240 hour(s))  Blood  Culture (routine x 2)     Status: Abnormal (Preliminary result)   Collection Time: 10/22/18 11:29 AM  Result Value Ref Range Status   Specimen Description   Final    BLOOD LEFT ANTECUBITAL Performed at [facility], [address] [place], [place] [address]    Special Requests   Final    BOTTLES DRAWN AEROBIC AND ANAEROBIC Blood Culture adequate volume Performed at [facility], [address], [place], [place] [address]    Culture  Setup Time   Final    GRAM POSITIVE COCCI Gram Stain Report Called to,Read Back By and Verified With: JOHNSON,B @ 0203 ON 10/23/18 BY JUW GS DONE @ [facility] Performed at [facility], [address], [place], [place] [address]    Culture STAPHYLOCOCCUS AUREUS (A)  Final   Report Status PENDING  Incomplete  Blood Culture (routine x 2)     Status: Abnormal (Preliminary result)   Collection Time: 10/22/18 11:29 AM  Result Value Ref Range Status   Specimen Description   Final    BLOOD BLOOD RIGHT WRIST Performed at [facility], [address] [place], [place] [address]    Special  Requests   Final    BOTTLES DRAWN AEROBIC AND ANAEROBIC Blood Culture adequate volume Performed at [facility], [address] [place], [place] [address]    Culture  Setup Time   Final    GRAM POSITIVE COCCI Gram Stain Report Called to,Read Back By and Verified With: GRAVES,K @ 0042 ON 10/23/18 BY JUW GS DONE @ [facility] Performed at [facility], [address] [place], [place] [address]    Culture (A)  Final    STAPHYLOCOCCUS AUREUS SUSCEPTIBILITIES TO FOLLOW Performed at [facility], [address], [place], [place] [address]    Report Status PENDING  Incomplete  SARS Coronavirus 2 (CEPHEID- Performed in [facility] hospital lab), Hosp Order     Status: None   Collection Time: 10/22/18 12:37 PM  Result Value Ref Range Status   SARS Coronavirus 2 NEGATIVE NEGATIVE Final    Comment: (NOTE) If result is NEGATIVE SARS-CoV-2 target nucleic acids are NOT DETECTED. The SARS-CoV-2 RNA is generally detectable in upper and lower  respiratory specimens during the acute phase of infection. The lowest  concentration of SARS-CoV-2 viral copies this assay can detect is 250  copies / mL. A negative result does not preclude SARS-CoV-2 infection  and should not be used as the sole basis for treatment or other  patient management decisions.  A negative result may occur with  improper specimen collection / handling, submission of specimen other  than nasopharyngeal swab, presence of viral mutation(s) within the  areas targeted by this assay, and inadequate number of viral copies  (<250 copies / mL). A negative result must be combined with clinical  observations, patient history, and epidemiological information. If result is POSITIVE SARS-CoV-2 target nucleic acids are DETECTED. The SARS-CoV-2 RNA is generally detectable in upper and lower  respiratory specimens dur ing the acute phase of infection.  Positive  results are indicative of active infection with SARS-CoV-2.  Clinical  correlation  with patient history and other diagnostic information is  necessary to determine patient infection status.  Positive results do  not rule out bacterial infection or co-infection with other viruses. If result is PRESUMPTIVE POSTIVE SARS-CoV-2 nucleic acids MAY BE PRESENT.   A presumptive positive result was obtained on the submitted specimen  and confirmed on repeat testing.  While 2019 novel coronavirus  (  SARS-CoV-2) nucleic acids may be present in the submitted sample  additional confirmatory testing may be necessary for epidemiological  and / or clinical management purposes  to differentiate between  SARS-CoV-2 and other Sarbecovirus currently known to infect humans.  If clinically indicated additional testing with an alternate test  methodology [phone]) is advised. The SARS-CoV-2 RNA is generally  detectable in upper and lower respiratory sp ecimens during the acute  phase of infection. The expected result is Negative. Fact Sheet for Patients:  BoilerBrush.com.cy Fact Sheet for Healthcare Providers: https://pope.com/ This test is not yet approved or cleared by the [place] FDA and has been authorized for detection and/or diagnosis of SARS-CoV-2 by FDA under an Emergency Use Authorization (EUA).  This EUA will remain in effect (meaning this test can be used) for the duration of the COVID-19 declaration under Section 564(b)(1) of the Act, 21 U.S.C. section 360bbb-3(b)(1), unless the authorization is terminated or revoked sooner. Performed at [facility], [address], [place], [place] [address]   Respiratory Panel by PCR     Status: None   Collection Time: 10/22/18 11:30 PM  Result Value Ref Range Status   Adenovirus NOT DETECTED NOT DETECTED Final   Coronavirus 229E NOT DETECTED NOT DETECTED Final    Comment: (NOTE) The Coronavirus on the Respiratory Panel, DOES NOT test for the novel  Coronavirus (2019 nCoV)     Coronavirus HKU1 NOT DETECTED NOT DETECTED Final   Coronavirus NL63 NOT DETECTED NOT DETECTED Final   Coronavirus OC43 NOT DETECTED NOT DETECTED Final   Metapneumovirus NOT DETECTED NOT DETECTED Final   Rhinovirus / Enterovirus NOT DETECTED NOT DETECTED Final   Influenza A NOT DETECTED NOT DETECTED Final   Influenza B NOT DETECTED NOT DETECTED Final   Parainfluenza Virus 1 NOT DETECTED NOT DETECTED Final   Parainfluenza Virus 2 NOT DETECTED NOT DETECTED Final   Parainfluenza Virus 3 NOT DETECTED NOT DETECTED Final   Parainfluenza Virus 4 NOT DETECTED NOT DETECTED Final   Respiratory Syncytial Virus NOT DETECTED NOT DETECTED Final   Bordetella pertussis NOT DETECTED NOT DETECTED Final   Chlamydophila pneumoniae NOT DETECTED NOT DETECTED Final   Mycoplasma pneumoniae NOT DETECTED NOT DETECTED Final    Comment: Performed at [facility], [address] [place], [place] [address]  MRSA PCR Screening     Status: None   Collection Time: 10/23/18  9:44 AM  Result Value Ref Range Status   MRSA by PCR NEGATIVE NEGATIVE Final    Comment:        The GeneXpert MRSA Assay (FDA approved for NASAL specimens only), is one component of a comprehensive MRSA colonization surveillance program. It is not intended to diagnose MRSA infection nor to guide or monitor treatment for MRSA infections. Performed at [facility], [address], [place], [place] [address]          Radiology Studies: Ct No Charge  Result Date: 10/22/2018 CLINICAL DATA:  78 year old female with a history of left hip pain. 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 EXAM: CT ANGIOGRAPHY CHEST, ABDOMEN AND PELVIS TECHNIQUE: Multidetector CT imaging through the chest, abdomen and pelvis was performed using the standard protocol during bolus administration of intravenous contrast. Multiplanar reconstructed images and MIPs were obtained and reviewed to evaluate the vascular anatomy. CONTRAST:  OMNIPAQUE  IOHEXOL 350 MG/ML SOLN COMPARISON:  Multiple prior most recent 07/27/2018, 02/01/2018, 07/22/2017, 03/15/2017 FINDINGS: CTA CHEST FINDINGS Cardiovascular: Cardiomegaly. No pericardial fluid/thickening. Calcifications of left anterior descending, circumflex,  right coronary arteries. Minimal calcifications of the aortic valve. Ascending aorta within normal limits. Redemonstration of overlapping stent grafts of the aortic arch and descending thoracic aorta for endovascular solution for acute aortic syndrome. The proximal margin of the stent graft is unchanged. Branch vessels remain patent. The left subclavian artery appears patent at the proximal aspect, either from antegrade flow or collateral perfusion. The stent graft terminates in the thoracic aorta just above the hiatus. Diameter at the the hiatus measures 3.4 cm. Main pulmonary artery measures 4.1 cm. No filling defects, although the timing of the contrast bolus is suboptimal. Mediastinum/Nodes: No mediastinal adenopathy. Unremarkable thoracic inlet. There is debris within the mid and distal thoracic esophagus. Lungs/Pleura: Centrilobular emphysema. No pneumothorax. Linear scarring/atelectasis at the lung bases. No confluent airspace disease. Redemonstration of left lower lobe calcified granulomas. 4 mm nodule in the right upper lobe periphery on image 72 of series 7. 5 mm nodule in the right lower lobe on image 83 of series 7. No endotracheal or endobronchial debris. Musculoskeletal: No displaced fracture. Degenerative changes of the spine. No bony canal narrowing. Review of the MIP images confirms the above findings. CTA ABDOMEN AND PELVIS FINDINGS VASCULAR Aorta: Atherosclerosis of the abdominal aorta. The juxtarenal aorta measures 2.6 cm. Largest diameter of the infrarenal abdominal aorta measures 31 mm, unchanged from prior. 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 Greatest diameter at this site measures 32  mm, slightly more than the baseline study of 03/15/2017 and no significant change from the comparison CT. No inflammatory changes or fluid. Celiac: Celiac artery remains patent, compressed at the origin with a configuration most compatible with overlying diaphragmatic cruise. SMA: No significant atherosclerosis at the SMA origin. Renals: Minimal atherosclerosis of the left renal artery. Minimal atherosclerosis of the right main renal artery. Small accessory right renal artery above the main renal artery and a small accessory renal artery below the main right renal artery. IMA: IMA remains patent, originating from the anterior aspect of the chronic dissection/irregular plaque Right lower extremity: Atherosclerotic changes of the right iliac system without occlusion or high-grade stenosis. Hypogastric artery remains patent. Common femoral artery with minimal atherosclerosis. Proximal profunda femoris and SFA patent. Left lower extremity: Atherosclerotic changes of the left iliac system without high-grade stenosis or occlusion. Hypogastric artery is patent. Common femoral artery with minimal atherosclerosis. Proximal profunda femoris and SFA patent. Veins: Unremarkable appearance of the venous system. Review of the MIP images confirms the above findings. NON-VASCULAR Hepatobiliary: Unremarkable appearance of the liver. Minimal calcified cholelithiasis within the dependent gallbladder. No inflammatory changes Pancreas: Unremarkable Spleen: Coarse calcifications of the spleen representing prior granulomatous disease Adrenals/Urinary Tract: Unremarkable left adrenal gland. 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 Right: Symmetric perfusion to the left kidney. No hydronephrosis. No nephrolithiasis. Unremarkable right ureter. Small lesion on the lateral cortex may represent hemorrhagic cyst, incompletely characterized on the current study. Left: Symmetric perfusion to the right. No hydronephrosis. Nonobstructing stone at  the inferior collecting system. Cyst at the superior cortex of the left kidney. Additional small lesions of the left kidney are too small to characterize. Unremarkable urinary bladder. Stomach/Bowel: Hiatal hernia. Unremarkable stomach. Unremarkable appearance of small bowel. No evidence of obstruction. Surgical changes of appendectomy. Colonic diverticula with no acute inflammatory changes. Lymphatic: No adenopathy. Mesenteric: No free fluid or air. No adenopathy. Reproductive: Hysterectomy Other: Surgical clips along the midline abdomen of prior surgery. No abdominal hernia Musculoskeletal: Scoliotic curvature of the thoracolumbar spine. Changes of vertebral  augmentation of L3 and L4. Degenerative changes of the thoracolumbar spine. No acute displaced fracture. Left lower extremity/hip: Mild degenerative changes of the left sacroiliac joint with no acute displaced fracture. No pelvic fracture identified. Left hip is congruent with no subluxation/dislocation. Mild joint space narrowing symmetric to the right. No femoral fracture identified. No left-sided joint effusion. Unremarkable appearance of the left hip musculature with no focal soft tissue swelling. No soft tissue lesion. No lymphadenopathy. IMPRESSION: No acute CT finding of the chest, abdomen, pelvis, or left hip. Redemonstration of endovascular repair of thoracic aorta with overlapping stent grafts of the aortic arch and descending thoracic aorta. 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 The aorta just above this plaque/dissection measures also 3.1 cm, unchanged. Aortic atherosclerosis. Aortic Atherosclerosis (ICD10-I70.0). Associated coronary artery disease. Enlarged main pulmonary artery suggesting early pulmonary hypertension. Additional ancillary findings as above. Signed, Yvone Neu. Reyne Dumas, RPVI [facility] Radiology Electronically Signed   By: Gilmer Mor D.O.   On: 10/22/2018 16:35   Ct Angio Chest/abd/pel For Dissection W And/or W/wo  Result Date: 10/22/2018 CLINICAL DATA:  78 year old female with a history of left hip pain. 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 EXAM: CT ANGIOGRAPHY CHEST, ABDOMEN AND PELVIS TECHNIQUE: Multidetector CT imaging through the chest, abdomen and pelvis was performed using the standard protocol during bolus administration of intravenous contrast. Multiplanar reconstructed images and MIPs were obtained and reviewed to evaluate the vascular anatomy. CONTRAST:  OMNIPAQUE IOHEXOL 350 MG/ML SOLN COMPARISON:  Multiple prior most recent 07/27/2018, 02/01/2018, 07/22/2017, 03/15/2017 FINDINGS: CTA CHEST FINDINGS Cardiovascular: Cardiomegaly. No pericardial fluid/thickening. Calcifications of left anterior descending, circumflex, right coronary arteries. Minimal calcifications of the aortic valve. Ascending aorta within normal limits. Redemonstration of overlapping stent grafts of the aortic arch and descending thoracic aorta for endovascular solution for acute aortic syndrome. The proximal margin of the stent graft is unchanged. Branch vessels remain patent. The left subclavian artery appears patent at the proximal aspect, either from antegrade flow or collateral perfusion. The stent graft terminates in the thoracic aorta just above the hiatus. Diameter at the the hiatus measures 3.4 cm. Main pulmonary artery measures 4.1 cm. No filling defects, although the timing of the contrast bolus is suboptimal. Mediastinum/Nodes: No mediastinal adenopathy. Unremarkable thoracic inlet. There is debris within the mid and distal thoracic esophagus. Lungs/Pleura: Centrilobular emphysema. No pneumothorax. Linear scarring/atelectasis at the lung bases. No confluent airspace disease. Redemonstration of left lower lobe calcified granulomas. 4 mm nodule in the right  upper lobe periphery on image 72 of series 7. 5 mm nodule in the right lower lobe on image 83 of series 7. No endotracheal or endobronchial debris. Musculoskeletal: No displaced fracture. Degenerative changes of the spine. No bony canal narrowing. Review of the MIP images confirms the above findings. CTA ABDOMEN AND PELVIS FINDINGS VASCULAR Aorta: Atherosclerosis of the abdominal aorta. The juxtarenal aorta measures 2.6 cm. Largest diameter of the infrarenal abdominal aorta measures 31 mm, unchanged from prior. 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 Greatest diameter at this site measures 32 mm, slightly more than the baseline study of 03/15/2017 and no significant change from the comparison CT. No inflammatory changes or fluid. Celiac: Celiac artery remains patent, compressed at the origin with a configuration most compatible with overlying diaphragmatic cruise. SMA: No significant atherosclerosis at the SMA origin. Renals: Minimal atherosclerosis  of the left renal artery. Minimal atherosclerosis of the right main renal artery. Small accessory right renal artery above the main renal artery and a small accessory renal artery below the main right renal artery. IMA: IMA remains patent, originating from the anterior aspect of the chronic dissection/irregular plaque Right lower extremity: Atherosclerotic changes of the right iliac system without occlusion or high-grade stenosis. Hypogastric artery remains patent. Common femoral artery with minimal atherosclerosis. Proximal profunda femoris and SFA patent. Left lower extremity: Atherosclerotic changes of the left iliac system without high-grade stenosis or occlusion. Hypogastric artery is patent. Common femoral artery with minimal atherosclerosis. Proximal profunda femoris and SFA patent. Veins: Unremarkable appearance of the venous system. Review of the MIP images confirms the above findings. NON-VASCULAR Hepatobiliary:  Unremarkable appearance of the liver. Minimal calcified cholelithiasis within the dependent gallbladder. No inflammatory changes Pancreas: Unremarkable Spleen: Coarse calcifications of the spleen representing prior granulomatous disease Adrenals/Urinary Tract: Unremarkable left adrenal gland. 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 Right: Symmetric perfusion to the left kidney. No hydronephrosis. No nephrolithiasis. Unremarkable right ureter. Small lesion on the lateral cortex may represent hemorrhagic cyst, incompletely characterized on the current study. Left: Symmetric perfusion to the right. No hydronephrosis. Nonobstructing stone at the inferior collecting system. Cyst at the superior cortex of the left kidney. Additional small lesions of the left kidney are too small to characterize. Unremarkable urinary bladder. Stomach/Bowel: Hiatal hernia. Unremarkable stomach. Unremarkable appearance of small bowel. No evidence of obstruction. Surgical changes of appendectomy. Colonic diverticula with no acute inflammatory changes. Lymphatic: No adenopathy. Mesenteric: No free fluid or air. No adenopathy. Reproductive: Hysterectomy Other: Surgical clips along the midline abdomen of prior surgery. No abdominal hernia Musculoskeletal: Scoliotic curvature of the thoracolumbar spine. Changes of vertebral augmentation of L3 and L4. Degenerative changes of the thoracolumbar spine. No acute displaced fracture. Left lower extremity/hip: Mild degenerative changes of the left sacroiliac joint with no acute displaced fracture. No pelvic fracture identified. Left hip is congruent with no subluxation/dislocation. Mild joint space narrowing symmetric to the right. No femoral fracture identified. No left-sided joint effusion. Unremarkable appearance of the left hip musculature with no focal soft tissue swelling. No soft tissue lesion. No lymphadenopathy. IMPRESSION: No acute CT finding of the chest, abdomen, pelvis, or left hip.  Redemonstration of endovascular repair of thoracic aorta with overlapping stent grafts of the aortic arch and descending thoracic aorta. 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 The aorta just above this plaque/dissection measures also 3.1 cm, unchanged. Aortic atherosclerosis. Aortic Atherosclerosis (ICD10-I70.0). Associated coronary artery disease. Enlarged main pulmonary artery suggesting early pulmonary hypertension. Additional ancillary findings as above. Signed, Yvone NeuJaime S. Reyne DumasWagner, DO, RPVI [facility] Electronically Signed   By: Gilmer MorJaime  Wagner D.O.   On: 10/22/2018 16:35        Scheduled Meds:  albuterol  3 mL Nebulization Q6H WA   aspirin EC  81 mg Oral Daily   atenolol  25 mg Oral BID   atorvastatin  40 mg Oral Daily   carbidopa-levodopa  1 tablet Oral Daily   enoxaparin (LOVENOX) injection  40 mg Subcutaneous Q24H   famotidine  20 mg Oral Daily   furosemide  20 mg Oral Daily   lisinopril  10 mg Oral Daily   mometasone-formoterol  2 puff Inhalation BID   PARoxetine  20 mg Oral Daily   potassium chloride  30 mEq Oral BID  rOPINIRole  0.5 mg Oral q morning - 10a   rOPINIRole  1 mg Oral QHS   Continuous Infusions:  ceFEPime (MAXIPIME) IV 2 g (10/24/18 0645)   metronidazole 500 mg (10/24/18 0733)   vancomycin 750 mg (10/24/18 0542)     LOS: 2 days    Time spent: 30 minutes    Solomon Skowronek Hoover Brunette, DO [facility] Pager [phone]  If 7PM-7AM, please contact night-coverage [email or web address] Password [facility] 10/24/2018, 12:34 PM

## 2018-10-24 NOTE — Plan of Care (Signed)
  Problem: Acute Rehab PT Goals(only PT should resolve) Goal: Patient Will Transfer Sit To/From Stand Outcome: Progressing Flowsheets (Taken 10/24/2018 1159) Patient will transfer sit to/from stand: Independently Goal: Pt Will Transfer Bed To Chair/Chair To Bed Outcome: Progressing Flowsheets (Taken 10/24/2018 1159) Pt will Transfer Bed to Chair/Chair to Bed: with modified independence Goal: Pt Will Ambulate Outcome: Progressing Flowsheets (Taken 10/24/2018 1159) Pt will Ambulate: 75 feet; with supervision; with least restrictive assistive device Goal: Pt Will Go Up/Down Stairs Outcome: Progressing Flowsheets (Taken 10/24/2018 1159) Pt will Go Up / Down Stairs: 3-5 stairs; with min guard assist; with rail(s); with least restrictive assistive device

## 2018-10-24 NOTE — Progress Notes (Signed)
Initial Nutrition Assessment  RD working remotely.  DOCUMENTATION CODES:   Not applicable  INTERVENTION:  Consider liberalizing diet in setting of poor PO intake.  Provide Ensure Enlive po BID, each supplement provides 350 kcal and 20 grams of protein. Patient prefers strawberry.  Provide daily MVI.  NUTRITION DIAGNOSIS:   Inadequate oral intake related to decreased appetite as evidenced by meal completion < 50%, per patient/family report.  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   Spoke with patient over the phone. She reports her appetite has been decreased for a while now related to hip/back pain. She tries to eat 2-3 meals per day but is not eating much at meals. Here she is only finishing 25% of meals. She is unable to describe intake PTA very well, only reports she wasn't eating much. Patient is amenable to trying ONS to help meet calorie/protein needs.  Patient reports her weight is "170-something lbs." She is unsure if she has lost any weight. Current weight is 81.7 kg (180.12 lbs).  Medications reviewed and include: famotidine, Lasix 20 mg daily, lisinopril 10 mg daily, potassium chloride 30 mEq BID, cefepime, vancomycin.  Labs reviewed.  Patient is at risk for malnutrition. Noted she has been diagnosed with malnutrition in the past. Unable to determine if patient meets criteria for  malnutrition at this time without more thorough history or NFPE.  NUTRITION - FOCUSED PHYSICAL EXAM:  Unable to complete at this time.  Diet Order:   Diet Order            Diet Heart Room service appropriate? Yes; Fluid consistency: Thin  Diet effective now             EDUCATION NEEDS:   No education needs have been identified at this time  Skin:  Skin Assessment: Skin Integrity Issues:(MSAD to groin)  Last BM:  Unknown  Height:   Ht Readings from Last 1 Encounters:  10/22/18 5\' 7"  (1.702 m)   Weight:   Wt Readings from Last 1 Encounters:  10/24/18 81.7 kg   Ideal Body Weight:  61.4 kg  BMI:  Body mass index is 28.21 kg/m.  Estimated Nutritional Needs:   Kcal:  1600-1800  Protein:  80-90 grams  Fluid:  1.6-1.8 L/day  Helane Rima, MS, RD, LDN Office: [phone] Pager: [phone] After Hours/Weekend Pager: [phone]

## 2018-10-25 ENCOUNTER — Inpatient Hospital Stay (HOSPITAL_COMMUNITY): Payer: Medicare HMO

## 2018-10-25 LAB — CULTURE, BLOOD (ROUTINE X 2)
Special Requests: ADEQUATE
Special Requests: ADEQUATE

## 2018-10-25 LAB — CBC
HCT: 35.5 % — ABNORMAL LOW (ref 36.0–46.0)
Hemoglobin: 11 g/dL — ABNORMAL LOW (ref 12.0–15.0)
MCH: 29.9 pg (ref 26.0–34.0)
MCHC: 31 g/dL (ref 30.0–36.0)
MCV: 96.5 fL (ref 80.0–100.0)
Platelets: 169 10*3/uL (ref 150–400)
RBC: 3.68 MIL/uL — ABNORMAL LOW (ref 3.87–5.11)
RDW: 14.9 % (ref 11.5–15.5)
WBC: 5.9 10*3/uL (ref 4.0–10.5)
nRBC: 0 % (ref 0.0–0.2)

## 2018-10-25 LAB — BASIC METABOLIC PANEL
Anion gap: 6 (ref 5–15)
BUN: 17 mg/dL (ref 8–23)
CO2: 28 mmol/L (ref 22–32)
Calcium: 9.1 mg/dL (ref 8.9–10.3)
Chloride: 102 mmol/L (ref 98–111)
Creatinine, Ser: 0.64 mg/dL (ref 0.44–1.00)
GFR calc Af Amer: >60 mL/min (ref >60)
GFR calc non Af Amer: >60 mL/min (ref >60)
Glucose, Bld: 97 mg/dL (ref 70–99)
Potassium: 4.7 mmol/L (ref 3.5–5.1)
Sodium: 136 mmol/L (ref 135–145)

## 2018-10-25 MED ORDER — HYDROCODONE-ACETAMINOPHEN 5-325 MG PO TABS
1.0000 | ORAL_TABLET | ORAL | Status: DC | PRN
  Administered 2018-10-25: 2 via ORAL
  Administered 2018-10-25 – 2018-10-27 (×4): 1 via ORAL
  Filled 2018-10-25 (×2): qty 1
  Filled 2018-10-25: qty 2
  Filled 2018-10-25 (×3): qty 1

## 2018-10-25 NOTE — TOC Initial Note (Signed)
Transition of Care [facility]) - Initial/Assessment Note    Patient Details  Name: MUREL STALLONE MRN: [id number] Date of Birth: 1941-01-21  [facility] CM/SW Contact:    Annice Needy, LCSW Phone Number: 10/25/2018, 2:53 PM  Clinical Narrative:                 Patient is from home with DIL, Harriette Ohara. At baseline she ambulates with a walker and completes ADLs unassisted although they are difficult. It is very difficult for her to bathe herself indicating that "it kills her" to complete her bathe.  Patient has a cane, walker and BSC (she doesn't like to use) in the home.  Ms. Meggitt is open to [facility] services and would like [facility] as they had them in the past.  Attending notified of HH need.  Expected Discharge Plan: Home w Home Health Services Barriers to Discharge: No Barriers Identified   Patient Goals and CMS Choice Patient states their goals for this hospitalization and ongoing recovery are:: to return home CMS Medicare.gov Compare Post Acute Care list provided to:: Other (Comment Required)(DIL, Harriette Ohara ) Choice offered to / list presented to : NA  Expected Discharge Plan and Services Expected Discharge Plan: Home w Home Health Services In-house Referral: Clinical Social Work Discharge Planning Services: CM Consult Post Acute Care Choice: Home Health Living arrangements for the past 2 months: Single Family Home                               Date [facility] Agency Contacted: 10/25/18 Time HH Agency Contacted: [phone] Representative spoke with at [facility] Agency: Alroy Bailiff  Prior Living Arrangements/Services Living arrangements for the past 2 months: Single Family Home Lives with:: Other (Comment)(DIL, Khadejah Liggon ) Patient language and need for interpreter reviewed:: Yes Do you feel safe going back to the place where you live?: Yes      Need for Family Participation in Patient Care: Yes (Comment) Care giver support system in place?: Yes (comment) Current  home services: DME Criminal Activity/Legal Involvement Pertinent to Current Situation/Hospitalization: No - Comment as needed  Activities of Daily Living Home Assistive Devices/Equipment: Walker (specify type)(four wheel walker) ADL Screening (condition at time of admission) Patient's cognitive ability adequate to safely complete daily activities?: Yes Is the patient deaf or have difficulty hearing?: No Does the patient have difficulty seeing, even when wearing glasses/contacts?: Yes Does the patient have difficulty concentrating, remembering, or making decisions?: No Patient able to express need for assistance with ADLs?: Yes Does the patient have difficulty dressing or bathing?: Yes Independently performs ADLs?: Yes (appropriate for developmental age) Does the patient have difficulty walking or climbing stairs?: Yes Weakness of Legs: Left Weakness of Arms/Hands: None  Permission Sought/Granted Permission sought to share information with : Family Supports          Permission granted to share info w Relationship: Harriette Ohara, DIL     Emotional Assessment Appearance:: Appears stated age   Affect (typically observed): Unable to Assess Orientation: : Oriented to Self, Oriented to Place Alcohol / Substance Use: Not Applicable Psych Involvement: No (comment)  Admission diagnosis:  Hypokalemia [E87.6] Pain [R52] Hypoxia [R09.02] Left hip pain [M25.552] Hip joint painful on movement, left [M25.552] Fever, unspecified fever cause [R50.9] Sepsis, due to unspecified organism, unspecified whether acute organ dysfunction present (HCC) [A41.9] Covid-19 Virus not Detected [IMO0001] Patient Active Problem List   Diagnosis Date Noted  .  Fever 10/22/2018  . Hypokalemia 10/22/2018  . Hip pain, left 10/22/2018  . Hypoxia 10/22/2018  . Congestive heart failure (HCC) 12/04/2016  . 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 Essential hypertension 12/04/2016   PCP:   Samuel JesterButler, Cynthia, DO Pharmacy:   [facility] [address] - [place], [place] - [address] [phone] [address] [place] [id number] Phone: [phone] Fax: [phone]     Social Determinants of Health (SDOH) Interventions    Readmission Risk Interventions No flowsheet data found.

## 2018-10-25 NOTE — Progress Notes (Signed)
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 Patient presents with left hip pain that started 1 to 2 days ago and is located in her left buttock and radiates down leg. Pain is 10 out of 10. Pain is same location as her chronic hip and leg pain, but much more severe. No recent falls, injuries. Patient states that movement appears to improve her pain, as well as massage. No provoking factors.  On arrival, the patient was noted to have a fever and hypoxia. The patient was unaware of her fever, although admits to feeling cold. Upon questioning, she does admit to some shortness of breath with exertion but is generally okay with rest. Occasional intermittent cough that is nonproductive.  She denies abdominal pain, nausea, vomiting, diarrhea.  She continues to have low back pain and fever curve is improving.   Assessment & Plan:   Principal Problem:   Fever Active Problems:   Congestive heart failure (HCC)   Ruptured aneurysm of thoracic aorta (HCC)   Essential hypertension   Hypokalemia   Hip pain, left   Hypoxia   Fever secondary to MSSA bacteremia and possible associated discitis/osteomyelitis -MRI of lumbar spine to evaluate for vertebral osteomyelitis and/or discitis-negative for signs of infection on 5/26 -TEE per Cardiology in AM; NPO after MN -Repeat blood cultures ordered 5/26 and pending -Follow CBC -MRSA negative  Mild hypoxemia-improving -Wean oxygen as tolerated -Up to chair/ IS -duonebs prn  Mild hypokalemia-resolved -Resolved -Recheck BMP in am -Hold oral supplementation for now  Hypertension -Controlled;  continue current medications  History of ruptured aneurysm -Stable  CHF -Currently stable -Monitor daily weights-currently stable  DVT prophylaxis:Lovenox Code Status:Full Family Communication:Daughter in law on phone 5/26 Disposition Plan:Continue current antibiotics; for TEE in am.   Consultants:  Cardiology  Procedures:  None  Antimicrobials:  Anti-infectives (From admission, onward)   Start     Dose/Rate Route Frequency Ordered Stop   10/23/18 2200  metroNIDAZOLE (FLAGYL) IVPB 500 mg  Status:  Discontinued     500 mg 100 mL/hr over 60 Minutes Intravenous Every 8 hours 10/22/18 2037 10/24/18 1244   10/23/18 0400  vancomycin (VANCOCIN) IVPB 750 mg/150 ml premix  Status:  Discontinued     750 mg 150 mL/hr over 60 Minutes Intravenous Every 12 hours 10/22/18 1440 10/25/18 0801   10/22/18 2200  ceFEPIme (MAXIPIME) 2 g in sodium chloride 0.9 % 100 mL IVPB     2 g 200 mL/hr over 30 Minutes Intravenous Every 8 hours 10/22/18 1440     10/22/18 1500  vancomycin (VANCOCIN) IVPB 1000 mg/200 mL premix     1,000 mg 200 mL/hr over 60 Minutes Intravenous  Once 10/22/18 1434 10/22/18 1922   10/22/18 1130  ceFEPIme (MAXIPIME) 2 g in sodium chloride 0.9 % 100 mL IVPB     2 g 200 mL/hr over 30 Minutes Intravenous  Once 10/22/18 1116 10/22/18 1309   10/22/18 1130  metroNIDAZOLE (FLAGYL) IVPB 500 mg     500 mg 100 mL/hr over 60 Minutes Intravenous  Once 10/22/18 1116 10/22/18 1515   10/22/18 1130  vancomycin (VANCOCIN) IVPB 1000 mg/200 mL premix     1,000 mg 200 mL/hr over 60 Minutes Intravenous  Once 10/22/18 1116 10/22/18 1412       Subjective: Patient seen and evaluated today with no new acute complaints or concerns. No acute concerns or events noted overnight. She states her hip pain is improved this am.  Objective: Vitals:   10/25/18 0430 10/25/18 0639 10/25/18 0829 10/25/18 0832  BP:  (!) 145/69    Pulse:  73    Resp:  20    Temp:  98 F (36.7 C)     TempSrc:  Oral    SpO2:  100% 97% 97%  Weight: 81.6 kg     Height:        Intake/Output Summary (Last 24 hours) at 10/25/2018 1355 Last data filed at 10/25/2018 1100 Gross per 24 hour  Intake 720 ml  Output 1150 ml  Net -430 ml   Filed Weights   10/22/18 2016 10/24/18 0637 10/25/18 0430  Weight: 81.2 kg 81.7 kg 81.6 kg    Examination:  General exam: Appears calm and comfortable  Respiratory system: Clear to auscultation. Respiratory effort normal. On Nettie. Cardiovascular system: S1 & S2 heard, RRR. No JVD, murmurs, rubs, gallops or clicks. No pedal edema. Gastrointestinal system: Abdomen is nondistended, soft and nontender. No organomegaly or masses felt. Normal bowel sounds heard. Central nervous system: Alert and oriented. No focal neurological deficits. Extremities: Symmetric 5 x 5 power. Skin: No rashes, lesions or ulcers Psychiatry: Judgement and insight appear normal. Mood & affect appropriate.     Data Reviewed: I have personally reviewed following labs and imaging studies  CBC: Recent Labs  Lab 10/22/18 1128 10/23/18 0601 10/24/18 0554 10/25/18 0527  WBC 10.1 20.2* 11.9* 5.9  NEUTROABS 8.5*  --   --   --   HGB 11.5* 12.3 11.5* 11.0*  HCT 35.2* 38.9 36.4 35.5*  MCV 93.9 95.1 96.6 96.5  PLT 142* 161 158 169   Basic Metabolic Panel: Recent Labs  Lab 10/22/18 1128 10/23/18 0601 10/24/18 0554 10/25/18 0527  NA 137 136 137 136  K 3.1* 3.5 4.5 4.7  CL 98 97* 100 102  CO2 GLUCOSE 122* 156* 118* 97  BUN CREATININE 0.61 0.60 0.65 0.64  CALCIUM 8.7* 8.8* 9.4 9.1   GFR: Estimated Creatinine Clearance: 65.7 mL/min (by C-G formula based on SCr of 0.64 mg/dL). Liver Function Tests: Recent Labs  Lab 10/22/18 1128  AST 11*  ALT <5  ALKPHOS 69  BILITOT 1.5*  PROT 7.2  ALBUMIN 3.6   No results for input(s): LIPASE, AMYLASE in the last 168 hours. No results for input(s): AMMONIA in the last 168 hours. Coagulation Profile: No  results for input(s): INR, PROTIME in the last 168 hours. Cardiac Enzymes: Recent Labs  Lab 10/22/18 1128  TROPONINI <0.03   BNP (last 3 results) No results for input(s): PROBNP in the last 8760 hours. HbA1C: No results for input(s): HGBA1C in the last 72 hours. CBG: No results for input(s): GLUCAP in the last 168 hours. Lipid Profile: No results for input(s): CHOL, HDL, LDLCALC, TRIG, CHOLHDL, LDLDIRECT in the last 72 hours. Thyroid Function Tests: No results for input(s): TSH, T4TOTAL, FREET4, T3FREE, THYROIDAB in the last 72 hours. Anemia Panel: No results for input(s): VITAMINB12, FOLATE, FERRITIN, TIBC, IRON, RETICCTPCT in the last 72 hours. Sepsis Labs: Recent Labs  Lab 10/22/18 1128 10/22/18 1328  LATICACIDVEN 0.8 0.6    Recent Results (from the past 240 hour(s))  Blood Culture (routine x 2)     Status:  Abnormal   Collection Time: 10/22/18 11:29 AM  Result Value Ref Range Status   Specimen Description   Final    BLOOD LEFT ANTECUBITAL Performed at [facility], [address], [place], [place] [address]    Special Requests   Final    BOTTLES DRAWN AEROBIC AND ANAEROBIC Blood Culture adequate volume Performed at [facility], [address] [place], [place] [address]    Culture  Setup Time   Final    GRAM POSITIVE COCCI Gram Stain Report Called to,Read Back By and Verified With: JOHNSON,B @ 0203 ON 10/23/18 BY JUW GS DONE @ [facility] Performed at [facility], [address] [place], [place] [address]    Culture (A)  Final    STAPHYLOCOCCUS AUREUS SUSCEPTIBILITIES PERFORMED ON PREVIOUS CULTURE WITHIN THE LAST 5 DAYS. Performed at [facility], [address], [place], [place] [address]    Report Status 10/25/2018 FINAL  Final  Blood Culture (routine x 2)     Status: Abnormal   Collection Time: 10/22/18 11:29 AM  Result Value Ref Range Status   Specimen Description   Final    BLOOD BLOOD RIGHT WRIST Performed at [facility], [address] [place],  [place] [address]    Special Requests   Final    BOTTLES DRAWN AEROBIC AND ANAEROBIC Blood Culture adequate volume Performed at [facility], [address] [place], [place] [address]    Culture  Setup Time   Final    GRAM POSITIVE COCCI Gram Stain Report Called to,Read Back By and Verified With: GRAVES,K @ 0042 ON 10/23/18 BY JUW GS DONE @ [facility] Performed at [facility], [address] [place], [place] [address]    Culture STAPHYLOCOCCUS AUREUS (A)  Final   Report Status 10/25/2018 FINAL  Final   Organism ID, Bacteria STAPHYLOCOCCUS AUREUS  Final      Susceptibility   Staphylococcus aureus - MIC*    CIPROFLOXACIN <=0.5 SENSITIVE Sensitive     ERYTHROMYCIN <=0.25 SENSITIVE Sensitive     GENTAMICIN <=0.5 SENSITIVE Sensitive     OXACILLIN 0.5 SENSITIVE Sensitive     TETRACYCLINE <=1 SENSITIVE Sensitive     VANCOMYCIN <=0.5 SENSITIVE Sensitive     TRIMETH/SULFA <=10 SENSITIVE Sensitive     CLINDAMYCIN <=0.25 SENSITIVE Sensitive     RIFAMPIN <=0.5 SENSITIVE Sensitive     Inducible Clindamycin NEGATIVE Sensitive     * STAPHYLOCOCCUS AUREUS  SARS Coronavirus 2 (CEPHEID- Performed in [facility] hospital lab), Hosp Order     Status: None   Collection Time: 10/22/18 12:37 PM  Result Value Ref Range Status   SARS Coronavirus 2 NEGATIVE NEGATIVE Final    Comment: (NOTE) If result is NEGATIVE SARS-CoV-2 target nucleic acids are NOT DETECTED. The SARS-CoV-2 RNA is generally detectable in upper and lower  respiratory specimens during the acute phase of infection. The lowest  concentration of SARS-CoV-2 viral copies this assay can detect is 250  copies / mL. A negative result does not preclude SARS-CoV-2 infection  and should not be used as the sole basis for treatment or other  patient management decisions.  A negative result may occur with  improper specimen collection / handling, submission of specimen other  than nasopharyngeal swab, presence of viral mutation(s) within the  areas targeted by  this assay, and inadequate number of viral copies  (<250 copies / mL). A negative result must be combined with clinical  observations, patient history, and epidemiological information. If result is POSITIVE SARS-CoV-2 target  nucleic acids are DETECTED. The SARS-CoV-2 RNA is generally detectable in upper and lower  respiratory specimens dur ing the acute phase of infection.  Positive  results are indicative of active infection with SARS-CoV-2.  Clinical  correlation with patient history and other diagnostic information is  necessary to determine patient infection status.  Positive results do  not rule out bacterial infection or co-infection with other viruses. If result is PRESUMPTIVE POSTIVE SARS-CoV-2 nucleic acids MAY BE PRESENT.   A presumptive positive result was obtained on the submitted specimen  and confirmed on repeat testing.  While 2019 novel coronavirus  (SARS-CoV-2) nucleic acids may be present in the submitted sample  additional confirmatory testing may be necessary for epidemiological  and / or clinical management purposes  to differentiate between  SARS-CoV-2 and other Sarbecovirus currently known to infect humans.  If clinically indicated additional testing with an alternate test  methodology [phone]) is advised. The SARS-CoV-2 RNA is generally  detectable in upper and lower respiratory sp ecimens during the acute  phase of infection. The expected result is Negative. Fact Sheet for Patients:  BoilerBrush.com.cy Fact Sheet for Healthcare Providers: https://pope.com/ This test is not yet approved or cleared by the [place] FDA and has been authorized for detection and/or diagnosis of SARS-CoV-2 by FDA under an Emergency Use Authorization (EUA).  This EUA will remain in effect (meaning this test can be used) for the duration of the COVID-19 declaration under Section 564(b)(1) of the Act, 21 U.S.C. section  360bbb-3(b)(1), unless the authorization is terminated or revoked sooner. Performed at [facility], [address], [place], [place] [address]   Respiratory Panel by PCR     Status: None   Collection Time: 10/22/18 11:30 PM  Result Value Ref Range Status   Adenovirus NOT DETECTED NOT DETECTED Final   Coronavirus 229E NOT DETECTED NOT DETECTED Final    Comment: (NOTE) The Coronavirus on the Respiratory Panel, DOES NOT test for the novel  Coronavirus (2019 nCoV)    Coronavirus HKU1 NOT DETECTED NOT DETECTED Final   Coronavirus NL63 NOT DETECTED NOT DETECTED Final   Coronavirus OC43 NOT DETECTED NOT DETECTED Final   Metapneumovirus NOT DETECTED NOT DETECTED Final   Rhinovirus / Enterovirus NOT DETECTED NOT DETECTED Final   Influenza A NOT DETECTED NOT DETECTED Final   Influenza B NOT DETECTED NOT DETECTED Final   Parainfluenza Virus 1 NOT DETECTED NOT DETECTED Final   Parainfluenza Virus 2 NOT DETECTED NOT DETECTED Final   Parainfluenza Virus 3 NOT DETECTED NOT DETECTED Final   Parainfluenza Virus 4 NOT DETECTED NOT DETECTED Final   Respiratory Syncytial Virus NOT DETECTED NOT DETECTED Final   Bordetella pertussis NOT DETECTED NOT DETECTED Final   Chlamydophila pneumoniae NOT DETECTED NOT DETECTED Final   Mycoplasma pneumoniae NOT DETECTED NOT DETECTED Final    Comment: Performed at [facility], [address], [place], [place] [address]  MRSA PCR Screening     Status: None   Collection Time: 10/23/18  9:44 AM  Result Value Ref Range Status   MRSA by PCR NEGATIVE NEGATIVE Final    Comment:        The GeneXpert MRSA Assay (FDA approved for NASAL specimens only), is one component of a comprehensive MRSA colonization surveillance program. It is not intended to diagnose MRSA infection nor to guide or monitor treatment for MRSA infections. Performed at [facility], [address] [place], [place] [address]   Culture, blood (routine x 2)  Status: None (Preliminary  result)   Collection Time: 10/25/18  9:52 AM  Result Value Ref Range Status   Specimen Description BLOOD LEFT HAND  Final   Special Requests   Final    BOTTLES DRAWN AEROBIC AND ANAEROBIC Blood Culture results may not be optimal due to an inadequate volume of blood received in culture bottles Performed at [facility], [address] [place], [place] [address]    Culture PENDING  Incomplete   Report Status PENDING  Incomplete  Culture, blood (routine x 2)     Status: None (Preliminary result)   Collection Time: 10/25/18  9:52 AM  Result Value Ref Range Status   Specimen Description BLOOD RIGHT HAND  Final   Special Requests   Final    BOTTLES DRAWN AEROBIC AND ANAEROBIC Blood Culture results may not be optimal due to an inadequate volume of blood received in culture bottles Performed at [facility], [address] [place], [place] [address]    Culture PENDING  Incomplete   Report Status PENDING  Incomplete         Radiology Studies: Mr Lumbar Spine Wo Contrast  Result Date: 10/25/2018 CLINICAL DATA:  Chronic low back pain. Evaluation for vertebral osteomyelitis and/or discitis in the setting of staph aureus bacteremia. Back pain for greater than 6 weeks. EXAM: MRI LUMBAR SPINE WITHOUT CONTRAST TECHNIQUE: Multiplanar, multisequence MR imaging of the lumbar spine was performed. No intravenous contrast was administered. COMPARISON:  MRI of the lumbar spine 03/01/2018. Kyphoplasty films 03/25/2018. FINDINGS: Segmentation: 5 non rib-bearing lumbar type vertebral bodies are present. The lowest fully formed vertebral body is L5. Alignment: AP alignment is anatomic. Levoconvex curvature is centered at L1. Vertebrae: Remote fractures at L4 and L3 have been treated with spinal augmentation. Asymmetric left endplate marrow changes are present at L4-5 the similar the prior study. There is no abnormal disc signal. Remote inferior endplate fracture is present at T12. Marrow signal and vertebral body  heights are maintained at L1 and L2. Conus medullaris and cauda equina: Conus extends to the L1 level. Conus and cauda equina appear normal. Paraspinal and other soft tissues: Axial images are severely degraded by patient motion. No definite solid organ lesions are present. Disc levels: L1-2: Negative. L2-3: Negative. L3-4: A broad-based disc protrusion is present. Progressive moderate central and bilateral foraminal stenosis is present, right greater than left. L4-5: A broad-based disc protrusion is present. Moderate facet hypertrophy is noted. Mild left subarticular narrowing is again seen. Moderate left and mild right foraminal narrowing is similar the prior exam. L5-S1: A shallow central disc protrusion is present. Mild facet hypertrophy is noted bilaterally. IMPRESSION: 1. No evidence for discitis or osteomyelitis. 2. Interval spinal augmentation at L4 and L3. 3. Progressive moderate central and bilateral foraminal stenosis at L3-4 is worse on the right. 4. Mild left subarticular with moderate left and mild right foraminal stenosis at L4-5 is stable. 5. Stable scoliosis. Electronically Signed   By: Marin Epting M.D.   On: 10/25/2018 13:27        Scheduled Meds: . albuterol  3 mL Nebulization Q6H WA  . aspirin EC  81 mg Oral Daily  . atenolol  25 mg Oral BID  . atorvastatin  40 mg Oral Daily  . carbidopa-levodopa  1 tablet Oral Daily  . enoxaparin (LOVENOX) injection  40 mg Subcutaneous Q24H  . famotidine  20 mg Oral Daily  . feeding supplement (ENSURE ENLIVE)  237 mL Oral BID BM  . furosemide  20 mg Oral Daily  . lisinopril  10 mg Oral Daily  . mometasone-formoterol  2 puff Inhalation BID  . multivitamin with minerals  1 tablet Oral Daily  . PARoxetine  20 mg Oral Daily  . potassium chloride  30 mEq Oral BID  . rOPINIRole  0.5 mg Oral q morning - 10a  . rOPINIRole  1 mg Oral QHS   Continuous Infusions: . ceFEPime (MAXIPIME) IV 2 g (10/25/18 0545)     LOS: 3 days    Time  spent: 30 minutes    Jahfari Ambers Hoover Brunette Zayla Agar, DO [facility] Pager [phone]  If 7PM-7AM, please contact night-coverage [email or web address] Password [email or web address] 10/25/2018, 1:55 PM

## 2018-10-25 NOTE — Progress Notes (Signed)
Physical Therapy Treatment Patient Details Name: Briana Dean MRN: [id number] DOB: July 11, 1940 Today's Date: 10/25/2018    History of Present Illness Briana Dean is a 78 y.o. 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  Patient presents with left hip pain that started 1 to 2 days ago and is located in her left buttock and radiates down leg.  Pain is 10 out of 10.  Pain is same location as her chronic hip and leg pain, but much more severe.  No recent falls, injuries.  Patient states that movement appears to improve her pain, as well as massage.  No provoking factors.    PT Comments    Pt lethergic following MRI earlier today though willing to participate with therapy today.  Presents with decreased mobility this session compared to last session though believe medication induced.  Pt able to complete bed mobility and transfer training with supervision, increased time due to weakness and fatigue.  Pt willing to transfer to chair but declined gait training due to fatigue.  Upon initial arrival pt with nasal cannula not in nose, was hanging from 1 ear.  O2 saturation assess that fluctatued from 82-97% very fast so unsure correct reading.  No c/o SOB, light headedness, dizziness.  Upon transfer to chair O2 saturation at 97% with 2L O2 A via nasal cannula.  RN aware of status in chair as well as O2 saturation.  Pt left in chair with call bell within reach and chair alarm set.  No reports of increased pain through session, was limited by Lt hip and LBP.     Follow Up Recommendations  Outpatient PT     Equipment Recommendations  None recommended by PT    Recommendations for Other Services       Precautions / Restrictions Precautions Precautions: Fall Restrictions Weight Bearing Restrictions: No    Mobility  Bed  Mobility Overal bed mobility: Independent             General bed mobility comments: increased time due to fatigue  Transfers Overall transfer level: Modified independent   Transfers: Sit to/from Stand;Stand Pivot Transfers Sit to Stand: Supervision Stand pivot transfers: Supervision       General transfer comment: safe mechanics  Ambulation/Gait                 Stairs             Wheelchair Mobility    Modified Rankin (Stroke Patients Only)       Balance                                            Cognition Arousal/Alertness: Lethargic;Suspect due to medications Behavior During Therapy: [facility] for tasks assessed/performed Overall Cognitive Status: Within Functional Limits for tasks assessed                                 General Comments: lethergic, difficulty completeing sentence due to fatigue.  Upon arrival nasal cannula hanging on one ear with decreased saturation      Exercises      General Comments        Pertinent Vitals/Pain Pain Score: 8  Pain Location:  Lt hip/buttock Pain Descriptors / Indicators: Aching;Sore Pain Intervention(s): Limited activity within patient's tolerance;Monitored during session    Home Living                      Prior Function            PT Goals (current goals can now be found in the care plan section)      Frequency    Min 2X/week      PT Plan Current plan remains appropriate    Co-evaluation              AM-PAC PT "6 Clicks" Mobility   Outcome Measure  Help needed turning from your back to your side while in a flat bed without using bedrails?: None Help needed moving from lying on your back to sitting on the side of a flat bed without using bedrails?: None Help needed moving to and from a bed to a chair (including a wheelchair)?: A Little Help needed standing up from a chair using your arms (e.g., wheelchair or bedside chair)?: A  Little Help needed to walk in hospital room?: A Little Help needed climbing 3-5 steps with a railing? : A Lot 6 Click Score: 19    End of Session Equipment Utilized During Treatment: Gait belt Activity Tolerance: Patient tolerated treatment well;Patient limited by fatigue;Patient limited by lethargy Patient left: in chair;with call bell/phone within reach;with chair alarm set(RN aware of status and O2 saturation %) Nurse Communication: Mobility status PT Visit Diagnosis: Unsteadiness on feet (R26.81);Difficulty in walking, not elsewhere classified (R26.2);Other abnormalities of gait and mobility (R26.89);Pain Pain - Right/Left: Left Pain - part of body: Hip     Time: 3329-5188 PT Time Calculation (min) (ACUTE ONLY): 18 min  Charges:  $Therapeutic Activity: 8-22 mins                     [address], LPTA; CBIS [phone] Juel Burrow 10/25/2018, 2:08 PM

## 2018-10-25 NOTE — Progress Notes (Signed)
Consulted for TEE for patient with bacteremia. Discussed procedure with patient and we will plan procedure for tomorrow morning, please make npo at midnight.   Dominga Ferry MD

## 2018-10-25 NOTE — Progress Notes (Signed)
PT Cancellation Note  Patient Details Name: Briana Dean MRN: [id number] DOB: [birth year]   Cancelled Treatment:    Reason Eval/Treat Not Completed: Patient at procedure or test/unavailable Per RN pt receiving MRI.  Will attempt therapy later if back in room and appropriate for therapy.  [address], LPTA; CBIS [phone]   Juel Burrow 10/25/2018, 12:34 PM

## 2018-10-25 NOTE — Progress Notes (Signed)
Patient is confused at times tonight. Patient is restless in bed and is also complaining of pain. Gave prn pain medication at 2229. Patient has had no relief. Paged mid level. No new orders at this time. Patient in bed with bed alarm in place. Will continue to monitor throughout shift.

## 2018-10-25 NOTE — Progress Notes (Signed)
Pharmacy Antibiotic Note  Today is day #4 of cefepime therapy for this 78 yo female with complicated medical history and staph aureus bacteremia.   One of two blood cultures from 5/24 grew pan-sensitive S. aureus. Repeat blood cultures have been drawn today. Patient is currently afebrile and WBC count is WNL.   Renal function appears stable at this time.  Plan: Continue cefepime 2g IV q8h  Pharmacy will continue to monitor renal function, cultures and patient progress.   Height: 5\' 7"  (170.2 cm) Weight: 179 lb 14.3 oz (81.6 kg) IBW/kg (Calculated) : 61.6  Temp (24hrs), Avg:97.4 F (36.3 C), Min:95.9 F (35.5 C), Max:98 F (36.7 C)  Recent Labs  Lab 10/22/18 1128 10/22/18 1328 10/23/18 0601 10/24/18 0554 10/25/18 0527  WBC 10.1  --  20.2* 11.9* 5.9  CREATININE 0.61  --  0.60 0.65 0.64  LATICACIDVEN 0.8 0.6  --   --   --     Estimated Creatinine Clearance: 65.7 mL/min (by C-G formula based on SCr of 0.64 mg/dL).    Allergies  Allergen Reactions  . Aspirin Other (See Comments)    Stomach pain  . Penicillins     Has patient had a PCN reaction causing immediate rash, facial/tongue/throat swelling, SOB or lightheadedness with hypotension No Has patient had a PCN reaction causing severe rash involving mucus membranes or skin necrosis: No Has patient had a PCN reaction that required hospitalization: No Has patient had a PCN reaction occurring within the last 10 years: No If all of the above answers are "NO", then may proceed with Cephalosporin use.     Antimicrobials this admission: vancomycin 5/23 >> 5/26 cefepime 5/23 >>    Microbiology results: 5/26 [facility] x2: 5/23 BC x2:  GPC in both bottles---> S.aureus (pan sensitive)  5/23 SARS-CoV-2: negative  5/23 Resp panel by IBB:[id number]     Thank you for allowing pharmacy to participate in  this patient's care.  Tama High, Pharm. D. Clinical Pharmacist 10/25/2018 11:13 AM

## 2018-10-26 ENCOUNTER — Encounter (HOSPITAL_COMMUNITY)
Admission: EM | Disposition: A | Discharge status: Skilled Nursing Facility | Payer: Self-pay | Source: Home / Self Care | Attending: Internal Medicine

## 2018-10-26 ENCOUNTER — Encounter (HOSPITAL_COMMUNITY): Payer: Self-pay | Admitting: *Deleted

## 2018-10-26 ENCOUNTER — Inpatient Hospital Stay (HOSPITAL_COMMUNITY): Payer: Medicare HMO

## 2018-10-26 DIAGNOSIS — I340 Nonrheumatic mitral (valve) insufficiency: Secondary | ICD-10-CM

## 2018-10-26 DIAGNOSIS — I371 Nonrheumatic pulmonary valve insufficiency: Secondary | ICD-10-CM

## 2018-10-26 DIAGNOSIS — I361 Nonrheumatic tricuspid (valve) insufficiency: Secondary | ICD-10-CM

## 2018-10-26 HISTORY — PX: TEE WITHOUT CARDIOVERSION: SHX5443

## 2018-10-26 LAB — CBC
HCT: 35.8 % — ABNORMAL LOW (ref 36.0–46.0)
Hemoglobin: 11.2 g/dL — ABNORMAL LOW (ref 12.0–15.0)
MCH: 29.8 pg (ref 26.0–34.0)
MCHC: 31.3 g/dL (ref 30.0–36.0)
MCV: 95.2 fL (ref 80.0–100.0)
Platelets: 177 10*3/uL (ref 150–400)
RBC: 3.76 MIL/uL — ABNORMAL LOW (ref 3.87–5.11)
RDW: 14.6 % (ref 11.5–15.5)
WBC: 4.9 10*3/uL (ref 4.0–10.5)
nRBC: 0 % (ref 0.0–0.2)

## 2018-10-26 LAB — BASIC METABOLIC PANEL
Anion gap: 8 (ref 5–15)
BUN: 15 mg/dL (ref 8–23)
CO2: 29 mmol/L (ref 22–32)
Calcium: 9 mg/dL (ref 8.9–10.3)
Chloride: 99 mmol/L (ref 98–111)
Creatinine, Ser: 0.57 mg/dL (ref 0.44–1.00)
GFR calc Af Amer: >60 mL/min (ref >60)
GFR calc non Af Amer: >60 mL/min (ref >60)
Glucose, Bld: 101 mg/dL — ABNORMAL HIGH (ref 70–99)
Potassium: 4.8 mmol/L (ref 3.5–5.1)
Sodium: 136 mmol/L (ref 135–145)

## 2018-10-26 SURGERY — ECHOCARDIOGRAM, TRANSESOPHAGEAL
Anesthesia: Moderate Sedation

## 2018-10-26 MED ORDER — LIDOCAINE VISCOUS HCL 2 % MT SOLN
OROMUCOSAL | Status: AC
  Filled 2018-10-26: qty 15

## 2018-10-26 MED ORDER — LORAZEPAM 2 MG/ML IJ SOLN
1.0000 mg | Freq: Once | INTRAMUSCULAR | Status: AC
  Administered 2018-10-26: 1 mg via INTRAVENOUS
  Filled 2018-10-26: qty 1

## 2018-10-26 MED ORDER — MIDAZOLAM HCL 5 MG/5ML IJ SOLN
INTRAMUSCULAR | Status: AC
  Filled 2018-10-26: qty 10

## 2018-10-26 MED ORDER — CEFAZOLIN SODIUM-DEXTROSE 2-4 GM/100ML-% IV SOLN
2.0000 g | Freq: Three times a day (TID) | INTRAVENOUS | Status: DC
  Administered 2018-10-26 – 2018-10-28 (×7): 2 g via INTRAVENOUS
  Filled 2018-10-26 (×7): qty 100

## 2018-10-26 MED ORDER — FENTANYL CITRATE (PF) 100 MCG/2ML IJ SOLN
INTRAMUSCULAR | Status: AC
  Filled 2018-10-26: qty 2

## 2018-10-26 MED ORDER — MIDAZOLAM HCL 5 MG/5ML IJ SOLN
INTRAMUSCULAR | Status: DC | PRN
  Administered 2018-10-26 (×2): 0.5 mg via INTRAVENOUS

## 2018-10-26 MED ORDER — SODIUM CHLORIDE 0.9% FLUSH
INTRAVENOUS | Status: AC
  Filled 2018-10-26: qty 10

## 2018-10-26 MED ORDER — SODIUM CHLORIDE 0.9 % IV SOLN
INTRAVENOUS | Status: DC
  Administered 2018-10-26: 09:00:00 via INTRAVENOUS

## 2018-10-26 MED ORDER — BUTAMBEN-TETRACAINE-BENZOCAINE 2-2-14 % EX AERO
INHALATION_SPRAY | CUTANEOUS | Status: AC
  Filled 2018-10-26: qty 5

## 2018-10-26 MED ORDER — LORAZEPAM 2 MG/ML IJ SOLN
0.5000 mg | Freq: Four times a day (QID) | INTRAMUSCULAR | Status: DC | PRN
  Administered 2018-10-26: 18:00:00 0.5 mg via INTRAVENOUS
  Filled 2018-10-26: qty 1

## 2018-10-26 MED ORDER — SODIUM CHLORIDE BACTERIOSTATIC 0.9 % IJ SOLN
INTRAMUSCULAR | Status: AC
  Filled 2018-10-26: qty 20

## 2018-10-26 MED ORDER — LIDOCAINE VISCOUS HCL 2 % MT SOLN
OROMUCOSAL | Status: DC | PRN
  Administered 2018-10-26: 7 mL via OROMUCOSAL

## 2018-10-26 MED ORDER — FENTANYL CITRATE (PF) 100 MCG/2ML IJ SOLN
INTRAMUSCULAR | Status: DC | PRN
  Administered 2018-10-26: 25 ug via INTRAVENOUS

## 2018-10-26 NOTE — CV Procedure (Signed)
Patient was brought to the procedure suite after appropriate consent was obtained. The posterior oropharynx was anesthesized with 2% viscous lidocaine. Moderate sedation was achieved with 1mg  of versed and 25mg  of fentanyl. Bite block was placed and she was layed in the lateral decubitus position. The TEE probe was intubated into the esophagus and several views were obtained. Please see full TEE report for findings, overall no evidence of vegetations. Cardiopulmonary monitoring was performed throuhgout the procedure, the patient tolerated well without complications.   Dina Rich MD

## 2018-10-26 NOTE — H&P (Signed)
Primary team has requested TEE in setting of bacteremia, will plan for procedure today with moderate sedation. Please see referenced admission H&P below for full history.   [name] MD    Rozann Lesches OEU:[id number] DOB: 11/16/1940 DOA: 10/22/2018  Referring physician: Dr Thurnell Garbe, ED physician  PCP: Octavio Graves, DO  Outpatient Specialists:  Patient Coming From: home  Chief Complaint: left hip pain  HPI: Briana Dean is a 78 y.o. 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 Patient presents with left hip pain that started 1 to 2 days ago and is located in her left buttock and radiates down leg. Pain is 10 out of 10. Pain is same location as her chronic hip and leg pain, but much more severe. No recent falls, injuries. Patient states that movement appears to improve her pain, as well as massage. No provoking factors.  On arrival, the patient was noted to have a fever and hypoxia. The patient was unaware of her fever, although admits to feeling cold. Upon questioning, she does admit to some shortness of breath with exertion but is generally okay with rest. Occasional intermittent cough that is nonproductive.  She denies abdominal pain, nausea, vomiting, diarrhea.  Emergency Department Course:  Sepsis work-up started with blood cultures, urine cultures. Broad-spectrum antibiotics of vancomycin, cefepime, Flagyl were started. White count 10. Lactic acid normal. Potassium 3.1  Review of Systems:  Pt denies any fevers, chills, nausea, vomiting, diarrhea, constipation, abdominal pain, shortness of breath, with dyspnea on exertion, orthopnea, cough, wheezing, palpitations, headache, vision changes, lightheadedness, dizziness, melena, rectal bleeding. Review of systems are otherwise negative      Past Medical History:   Diagnosis Date  . AAA (abdominal aortic aneurysm) ([place])   . Acute on chronic respiratory failure with hypoxia ([place])   . Anemia, unspecified   . Aneurysm of thoracic aorta ([place])   . Anxiety disorder   . Chronic combined systolic and diastolic heart failure ([place])   . Constipation   . COPD (chronic obstructive pulmonary disease) ([place])   . 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 Gastroesophageal reflux disease without esophagitis   . Hyperlipidemia, unspecified   . Hypertension, essential   . Insomnia, unspecified   . Low back pain   . Major depressive disorder, single episode, unspecified   . PAD (peripheral artery disease) ([place])   . Respiratory failure ([place])   . Restless leg syndrome   . Unspecified protein-calorie malnutrition ([place])   . Weakness         Past Surgical History:  Procedure Laterality Date  . IR KYPHO EA ADDL LEVEL THORACIC OR LUMBAR  03/25/2018  . IR KYPHO LUMBAR INC FX REDUCE BONE BX UNI/BIL CANNULATION INC/IMAGING  03/25/2018  . THORACIC AORTIC ANEURYSM REPAIR  09/06/2016   TEVAR  . TRACHEOSTOMY  08/2016  . VIDEO ASSISTED THORACOSCOPY (VATS)/THOROCOTOMY  08/2016   Social History: reports that she quit smoking about 2 years ago. Her smoking use included cigarettes. She has a 60.00 pack-year smoking history. She has never used smokeless tobacco. She reports that she does not drink alcohol or use drugs.  Patient lives at home       Allergies  Allergen Reactions  . Aspirin Other (See Comments)    Stomach pain  . Penicillins     Has patient had a PCN reaction causing immediate rash, facial/tongue/throat swelling,  SOB or lightheadedness with hypotension No  Has patient had a PCN reaction causing severe rash involving mucus membranes or skin necrosis: No  Has patient had a PCN reaction that required hospitalization: No  Has patient had a PCN reaction occurring within the last 10 years: No  If all of the above answers are "NO", then may proceed with Cephalosporin  use.         Family History  Problem Relation Age of Onset  . Congenital heart disease Mother   . Heart disease Mother   . Peripheral vascular disease Father   . Alcoholism Father   . Pectus carinatum Son   . Heart failure Son   . Heart attack Son 61  . COPD Sister           Prior to Admission medications   Medication Sig Start Date End Date Taking? Authorizing Provider  atenolol (TENORMIN) 25 MG tablet Take 25 mg by mouth 2 (two) times daily.    Yes [provider]  carbidopa-levodopa (SINEMET IR) 25-100 MG tablet Take 1 tablet by mouth daily.   Yes [provider]  ergocalciferol (VITAMIN D2) 50000 units capsule Take 50,000 Units by mouth once a week.   Yes [provider]  famotidine (PEPCID) 20 MG tablet Take 20 mg by mouth daily.   Yes [provider]  furosemide (LASIX) 20 MG tablet Take 20 mg by mouth daily.   Yes [provider]  lisinopril (PRINIVIL,ZESTRIL) 10 MG tablet TAKE 1 TABLET BY MOUTH ONCE DAILY  Patient taking differently: Take 10 mg by mouth daily.  12/06/17  Yes Herminio Commons, MD  PARoxetine (PAXIL) 20 MG tablet Take 20 mg by mouth daily.   Yes [provider]  rOPINIRole (REQUIP) 0.5 MG tablet Take 0.5-1 mg by mouth See admin instructions. 0.'5mg'$  in the morning and '1mg'$  at bedtime   Yes [provider]  traMADol (ULTRAM) 50 MG tablet Take by mouth every 6 (six) hours as needed.   Yes [provider]  aspirin EC 81 MG tablet Take 81 mg by mouth daily.    [provider]  atorvastatin (LIPITOR) 40 MG tablet Take 40 mg by mouth daily.    [provider]  ferrous sulfate 324 (65 Fe) MG TBEC Take by mouth daily.    [provider]  Ipratropium-Albuterol (DUONEB IN) Inhale into the lungs 3 (three) times daily.    [provider]  Nutritional Supplements (RA MELATONIN/B-6 PO) Take 1 tablet by mouth at bedtime.    [provider]  polyethylene glycol  (MIRALAX / GLYCOLAX) packet Take 17 g by mouth daily as needed for mild constipation.     [provider]  Physical Exam:  BP (!) 157/111  Pulse (!) 221  Temp (!) 101.9 F (38.8 C) (Rectal)  Resp (!) 26  Ht '5\' 7"'$  (1.702 m)  Wt 77.1 kg  LMP (LMP Unknown)  SpO2 (!) 58%  BMI 26.63 kg/m  General: Elderly Caucasian female. Awake and alert and oriented x3. No acute cardiopulmonary distress.  HEENT: Normocephalic atraumatic. Right and left ears normal in appearance. Pupils equal, round, reactive to light. Extraocular muscles are intact. Sclerae anicteric and noninjected. Moist mucosal membranes. No mucosal lesions.  Neck: Neck supple without lymphadenopathy. No carotid bruits. No masses palpated.  Cardiovascular: Regular rate with normal S1-S2 sounds. No murmurs, rubs, gallops auscultated. No JVD.  Respiratory: Breath sounds slightly tight, but no wheezes, rales, rhonchi. Lungs clear to auscultation bilaterally. No  accessory muscle use.  Abdomen: Soft, nontender, nondistended. Active bowel sounds. No masses or hepatosplenomegaly  Skin: No rashes, lesions, or ulcerations. Dry, warm to touch. 2+ dorsalis pedis and radial pulses.  Musculoskeletal: No spinous process tenderness throughout entire spine. She does have point tenderness in the left buttock around the piriformis. No calf or leg pain. All major joints not erythematous nontender. No upper or lower joint deformation. Good ROM. No contractures  Psychiatric: Intact judgment and insight. Pleasant and cooperative.  Neurologic: No focal neurological deficits. Strength is 5/5 and symmetric in upper and lower extremities. Cranial nerves II through XII are grossly intact.         Labs on Admission: I have personally reviewed following labs and imaging studies  CBC:  Last Labs                                       Basic Metabolic Panel:  Last Labs                                               GFR:  Estimated  Creatinine Clearance: 64 mL/min (by C-G formula based on SCr of 0.61 mg/dL).  Liver Function Tests:  Last Labs                                       Last Labs      Last Labs      Coagulation Profile:  Last Labs      Cardiac Enzymes:  Last Labs                   BNP (last 3 results)  Recent Labs (within last 365 days)     HbA1C:  Recent Labs (last 2 labs)      CBG:  Last Labs      Lipid Profile:  Recent Labs (last 2 labs)      Thyroid Function Tests:  Recent Labs (last 2 labs)      Anemia Panel:  Recent Labs (last 2 labs)      Urine analysis:  Labs (Brief)                                                                                   Sepsis Labs:  '@LABRCNTIP'$ (procalcitonin:4,lacticidven:4)  )         Recent Results (from the past 240 hour(s))  Blood Culture (routine x 2) Status: None (Preliminary result)   Collection Time: 10/22/18 11:29 AM  Result Value Ref Range Status   Specimen Description BLOOD LEFT ANTECUBITAL  Final   Special Requests   Final    BOTTLES DRAWN AEROBIC AND ANAEROBIC Blood Culture adequate volume   Culture   Final    NO GROWTH < 12 HOURS  Performed at [facility], [address], [place], [place] [address]    Report Status PENDING  Incomplete  Blood Culture (routine x 2) Status: None (Preliminary result)   Collection Time: 10/22/18 11:29 AM  Result Value Ref Range Status   Specimen Description BLOOD BLOOD RIGHT WRIST  Final   Special Requests   Final    BOTTLES DRAWN AEROBIC AND ANAEROBIC Blood Culture adequate volume   Culture   Final    NO GROWTH < 12 HOURS  Performed at [facility], [address], [place], [place] [address]    Report Status PENDING  Incomplete  SARS Coronavirus 2 (CEPHEID- Performed in [place] hospital lab), Hosp Order Status: None   Collection Time: 10/22/18 12:37 PM  Result Value Ref Range Status   SARS Coronavirus 2 NEGATIVE NEGATIVE Final    Comment: (NOTE)  If  result is NEGATIVE  SARS-CoV-2 target nucleic acids are NOT DETECTED.  The SARS-CoV-2 RNA is generally detectable in upper and lower  respiratory specimens during the acute phase of infection. The lowest  concentration of SARS-CoV-2 viral copies this assay can detect is 250  copies / mL. A negative result does not preclude SARS-CoV-2 infection  and should not be used as the sole basis for treatment or other  patient management decisions. A negative result may occur with  improper specimen collection / handling, submission of specimen other  than nasopharyngeal swab, presence of viral mutation(s) within the  areas targeted by this assay, and inadequate number of viral copies  (<250 copies / mL). A negative result must be combined with clinical  observations, patient history, and epidemiological information.  If result is POSITIVE  SARS-CoV-2 target nucleic acids are DETECTED.  The SARS-CoV-2 RNA is generally detectable in upper and lower  respiratory specimens dur  ing the acute phase of infection. Positive  results are indicative of active infection with SARS-CoV-2. Clinical  correlation with patient history and other diagnostic information is  necessary to determine patient infection status. Positive results do  not rule out bacterial infection or co-infection with other viruses.  If result is PRESUMPTIVE POSTIVE  SARS-CoV-2 nucleic acids MAY BE PRESENT.  A presumptive positive result was obtained on the submitted specimen  and confirmed on repeat testing. While 2019 novel coronavirus  (SARS-CoV-2) nucleic acids may be present in the submitted sample  additional confirmatory testing may be necessary for epidemiological  and / or clinical management purposes to differentiate between  SARS-CoV-2 and other Sarbecovirus currently known to infect humans.  If clinically indicated additional testing with an alternate test  methodology [phone]) is advised. The SARS-CoV-2 RNA is generally   detectable in upper and lower respiratory sp  ecimens during the acute  phase of infection.  The expected result is Negative.  Fact Sheet for Patients: StrictlyIdeas.no  Fact Sheet for Healthcare Providers:  BankingDealers.co.za  This test is not yet approved or cleared by the [place] FDA and  has been authorized for detection and/or diagnosis of SARS-CoV-2 by  FDA under an Emergency Use Authorization (EUA). This EUA will remain  in effect (meaning this test can be used) for the duration of the  COVID-19 declaration under Section 564(b)(1) of the Act, 21 U.S.C.  section 360bbb-3(b)(1), unless the authorization is terminated or  revoked sooner.  Performed at [facility], [address] [place], [place] [address]    Radiological Exams on Admission:   Imaging Results (Last 48 hours)     EKG: Independently reviewed. Sinus rhythm with left atrial enlargement. Right bundle Kwan Shellhammer block. No acute ST changes.  Assessment/Plan:  Principal Problem:  Fever  Active Problems:  Congestive heart failure (HCC)  Ruptured aneurysm of thoracic aorta (HCC)  Essential hypertension  Hypokalemia  Hip pain, left  Hypoxia   This patient was discussed with the ED physician, including pertinent vitals, physical exam findings, labs, and imaging. We also discussed care given by the ED provider.  1. Fever 1. Unknown source. 2. Cultures pending 3. EDP suggested discitis/osteomyelitis, however there is no back pain elicited on my exam or elicited with her movements or at rest. 4. Continue broad-spectrum antibiotics 5. I have ordered an ESR and CRP to be added onto the previous collections. These are still pending at this time. 6. COVID negative 7. Add respiratory virus panel 8. CBC and BMP tomorrow 2. Left hip pain 1. Uncertain as to how this connects to the patient's fever. At this point, I doubt that the 2 are related. 2. CT of the pelvis was able  to view the hip joints, showing mild osteoarthritis 3. We will have physical therapy see the patient 3. Hypoxia 1. No particular cough, no evidence of infiltrate or congestive heart failure on x-ray 2. The patient does have COPD and does patient appears to have some tight breath sounds, although no true evidence of bronchospasm.  3. The patient does not appear to be on a controller inhaler at home. Will start dulera. 4. Will start with albuterol and DuoNeb's to see if this improves the patient's hypoxia 5. Continuous pulse oximetry 4. Hypokalemia 1. We will replace potassium and recheck potassium in the morning 5. Hypertension 1. Continue antihypertensives 6. History of ruptured aneurysm 1. Stable 7. Congestive heart failure 1. Appears to be compensated at this time DVT prophylaxis: Lovenox  Consultants: None  Code Status: Full code  Family Communication: None  Disposition Plan: Patient should be able to return home following admission  Truett Mainland, DO

## 2018-10-26 NOTE — Progress Notes (Signed)
Mid level placed one time order for ativan IV refer to mar for correct dose.

## 2018-10-26 NOTE — Progress Notes (Signed)
Patient Demographics:    Briana Dean, is a 78 y.o. female, DOB - [birth year], GEZ:[id number]  Admit date - 10/22/2018   Admitting Physician Pratik Hoover Brunette, DO  Outpatient Primary MD for the patient is Samuel Jester, DO  LOS - 4   Chief Complaint  Patient presents with   Hip Pain        Subjective:    Briana Dean today has no fevers, no emesis,  No chest pain, remains confused from time to time,  Assessment  & Plan :    Principal Problem:   Fever Active Problems:   Congestive heart failure (HCC)   Ruptured aneurysm of thoracic aorta (HCC)   Essential hypertension   Hypokalemia   Hip pain, left   Hypoxia   Brief Summary:- 78 yo F with prev thoracic aortic aneurysm repair as well as lumbar kyphoplasty admitted 10/22/18 with L buttock pain, MSSA bacteremia as well as fever/hypoxia/confusion.  Her studies for spine and previous aortic repair do not show evidence of disseminated infection.  .   A/p 1)MSSA Bacteremia--- repeat blood cultures from 10/25/2018 NGTD, TEE negative on 10/26/2018,  infectious disease consult/input from Dr. Johny Sax appreciated... Recommends IV Ancef for 2 weeks starting 10/26/2018 followed by 2 weeks of p.o. Keflex if repeat blood cultures remain negative, This is a prolonged course given her aortic repair. patient will need repeat blood cultures 1 week after completing antibiotics. MRSA screen is Negative.   2)HTN--- stable, c/n Atenolol 25 mg po bid,   3)HFpEF--- stable, last known EF is 60 to 65 %, c/n Lisinopril 10 mg daily, Lasix 20 mg daily,   4)HLD--- c/n Lipitor 40 mg and aspirin,   5)COPD-stable, c/n Dulera, c/n   Disposition/Need for in-Hospital Stay- patient unable to be discharged at this time due to MSSA bacteremia requiring iv Antibiotics  Code Status : FULL  Family Communication:   Na   Disposition Plan  : FULL  Consults  :   Cardiology/Infectious Disease  DVT Prophylaxis  :  Lovenox -  Lab Results  Component Value Date   PLT 177 10/26/2018    Inpatient Medications  Scheduled Meds:  albuterol  3 mL Nebulization Q6H WA   aspirin EC  81 mg Oral Daily   atenolol  25 mg Oral BID   atorvastatin  40 mg Oral Daily   carbidopa-levodopa  1 tablet Oral Daily   enoxaparin (LOVENOX) injection  40 mg Subcutaneous Q24H   famotidine  20 mg Oral Daily   feeding supplement (ENSURE ENLIVE)  237 mL Oral BID BM   fentaNYL       furosemide  20 mg Oral Daily   lidocaine       lisinopril  10 mg Oral Daily   midazolam       mometasone-formoterol  2 puff Inhalation BID   multivitamin with minerals  1 tablet Oral Daily   PARoxetine  20 mg Oral Daily   potassium chloride  30 mEq Oral BID   rOPINIRole  0.5 mg Oral q morning - 10a   rOPINIRole  1 mg Oral QHS   sodium chloride flush       Continuous Infusions:   ceFAZolin (ANCEF) IV 2 g (10/26/18 1510)   PRN Meds:.HYDROcodone-acetaminophen, ipratropium-albuterol,  Muscle Rub, ondansetron **OR** ondansetron (ZOFRAN) IV, polyethylene glycol, traMADol    Anti-infectives (From admission, onward)   Start     Dose/Rate Route Frequency Ordered Stop   10/26/18 1400  ceFAZolin (ANCEF) IVPB 2g/100 mL premix     2 g 200 mL/hr over 30 Minutes Intravenous Every 8 hours 10/26/18 0953     10/23/18 2200  metroNIDAZOLE (FLAGYL) IVPB 500 mg  Status:  Discontinued     500 mg 100 mL/hr over 60 Minutes Intravenous Every 8 hours 10/22/18 2037 10/24/18 1244   10/23/18 0400  vancomycin (VANCOCIN) IVPB 750 mg/150 ml premix  Status:  Discontinued     750 mg 150 mL/hr over 60 Minutes Intravenous Every 12 hours 10/22/18 1440 10/25/18 0801   10/22/18 2200  ceFEPIme (MAXIPIME) 2 g in sodium chloride 0.9 % 100 mL IVPB  Status:  Discontinued     2 g 200 mL/hr over 30 Minutes Intravenous Every 8 hours 10/22/18 1440 10/26/18 0952   10/22/18 1500  vancomycin (VANCOCIN) IVPB 1000  mg/200 mL premix     1,000 mg 200 mL/hr over 60 Minutes Intravenous  Once 10/22/18 1434 10/22/18 1922   10/22/18 1130  ceFEPIme (MAXIPIME) 2 g in sodium chloride 0.9 % 100 mL IVPB     2 g 200 mL/hr over 30 Minutes Intravenous  Once 10/22/18 1116 10/22/18 1309   10/22/18 1130  metroNIDAZOLE (FLAGYL) IVPB 500 mg     500 mg 100 mL/hr over 60 Minutes Intravenous  Once 10/22/18 1116 10/22/18 1515   10/22/18 1130  vancomycin (VANCOCIN) IVPB 1000 mg/200 mL premix     1,000 mg 200 mL/hr over 60 Minutes Intravenous  Once 10/22/18 1116 10/22/18 1412        Objective:   Vitals:   10/26/18 0950 10/26/18 0955 10/26/18 1317 10/26/18 1422  BP: 94/70  97/73   Pulse: 72  72   Resp: 14 13 16    Temp:   97.6 F (36.4 C)   TempSrc:   Oral   SpO2: 100%  100% 100%  Weight:      Height:        Wt Readings from Last 3 Encounters:  10/26/18 78.1 kg  08/02/18 83.3 kg  03/25/18 81.6 kg     Intake/Output Summary (Last 24 hours) at 10/26/2018 1741 Last data filed at 10/26/2018 1537 Gross per 24 hour  Intake 749.66 ml  Output 1000 ml  Net -250.34 ml     Physical Exam Patient is examined daily including today on 10/26/18 , exams remain the same as of yesterday except that has changed   Gen:- Awake Alert, In no acute distress  HEENT:- [place].AT, No sclera icterus Neck-Supple Neck,No JVD,.  Lungs-  CTAB , fair symmetrical air movement CV- S1, S2 normal, regular  Abd-  +ve B.Sounds, Abd Soft, No tenderness,    Extremity/Skin:- No  edema, pedal pulses present  Psych-confusional episodes and disorientation persist  neuro-generalized weakness, no new focal deficits, no tremors   Data Review:   Micro Results Recent Results (from the past 240 hour(s))  Blood Culture (routine x 2)     Status: Abnormal   Collection Time: 10/22/18 11:29 AM  Result Value Ref Range Status   Specimen Description   Final    BLOOD LEFT ANTECUBITAL Performed at [facility], [address], [place], [place] [address]     Special Requests   Final    BOTTLES DRAWN AEROBIC AND ANAEROBIC Blood Culture adequate volume Performed at [facility],  [address] [place], [place] [address]    Culture  Setup Time   Final    GRAM POSITIVE COCCI Gram Stain Report Called to,Read Back By and Verified With: JOHNSON,B @ 0203 ON 10/23/18 BY JUW GS DONE @ [facility] Performed at [facility], [address], [place], [place] [address]    Culture (A)  Final    STAPHYLOCOCCUS AUREUS SUSCEPTIBILITIES PERFORMED ON PREVIOUS CULTURE WITHIN THE LAST 5 DAYS. Performed at [facility], [address] [place], [place] [address]    Report Status 10/25/2018 FINAL  Final  Blood Culture (routine x 2)     Status: Abnormal   Collection Time: 10/22/18 11:29 AM  Result Value Ref Range Status   Specimen Description   Final    BLOOD BLOOD RIGHT WRIST Performed at [facility], [address] [place], [place] [address]    Special Requests   Final    BOTTLES DRAWN AEROBIC AND ANAEROBIC Blood Culture adequate volume Performed at [facility], [address], [place], [place] [address]    Culture  Setup Time   Final    GRAM POSITIVE COCCI Gram Stain Report Called to,Read Back By and Verified With: GRAVES,K @ 0042 ON 10/23/18 BY JUW GS DONE @ [facility] Performed at [facility], [address] [place], [place] [address]    Culture STAPHYLOCOCCUS AUREUS (A)  Final   Report Status 10/25/2018 FINAL  Final   Organism ID, Bacteria STAPHYLOCOCCUS AUREUS  Final      Susceptibility   Staphylococcus aureus - MIC*    CIPROFLOXACIN <=0.5 SENSITIVE Sensitive     ERYTHROMYCIN <=0.25 SENSITIVE Sensitive     GENTAMICIN <=0.5 SENSITIVE Sensitive     OXACILLIN 0.5 SENSITIVE Sensitive     TETRACYCLINE <=1 SENSITIVE Sensitive     VANCOMYCIN <=0.5 SENSITIVE Sensitive     TRIMETH/SULFA <=10 SENSITIVE Sensitive     CLINDAMYCIN <=0.25 SENSITIVE Sensitive     RIFAMPIN <=0.5 SENSITIVE Sensitive     Inducible Clindamycin NEGATIVE Sensitive     * STAPHYLOCOCCUS  AUREUS  SARS Coronavirus 2 (CEPHEID- Performed in [facility] hospital lab), Hosp Order     Status: None   Collection Time: 10/22/18 12:37 PM  Result Value Ref Range Status   SARS Coronavirus 2 NEGATIVE NEGATIVE Final    Comment: (NOTE) If result is NEGATIVE SARS-CoV-2 target nucleic acids are NOT DETECTED. The SARS-CoV-2 RNA is generally detectable in upper and lower  respiratory specimens during the acute phase of infection. The lowest  concentration of SARS-CoV-2 viral copies this assay can detect is 250  copies / mL. A negative result does not preclude SARS-CoV-2 infection  and should not be used as the sole basis for treatment or other  patient management decisions.  A negative result may occur with  improper specimen collection / handling, submission of specimen other  than nasopharyngeal swab, presence of viral mutation(s) within the  areas targeted by this assay, and inadequate number of viral copies  (<250 copies / mL). A negative result must be combined with clinical  observations, patient history, and epidemiological information. If result is POSITIVE SARS-CoV-2 target nucleic acids are DETECTED. The SARS-CoV-2 RNA is generally detectable in upper and lower  respiratory specimens dur ing the acute phase of infection.  Positive  results are indicative of active infection with SARS-CoV-2.  Clinical  correlation with patient history and other diagnostic information is  necessary to determine patient infection status.  Positive results do  not rule out bacterial infection  or co-infection with other viruses. If result is PRESUMPTIVE POSTIVE SARS-CoV-2 nucleic acids MAY BE PRESENT.   A presumptive positive result was obtained on the submitted specimen  and confirmed on repeat testing.  While 2019 novel coronavirus  (SARS-CoV-2) nucleic acids may be present in the submitted sample  additional confirmatory testing may be necessary for epidemiological  and / or clinical management  purposes  to differentiate between  SARS-CoV-2 and other Sarbecovirus currently known to infect humans.  If clinically indicated additional testing with an alternate test  methodology [phone]) is advised. The SARS-CoV-2 RNA is generally  detectable in upper and lower respiratory sp ecimens during the acute  phase of infection. The expected result is Negative. Fact Sheet for Patients:  BoilerBrush.com.cy Fact Sheet for Healthcare Providers: https://pope.com/ This test is not yet approved or cleared by the [place] FDA and has been authorized for detection and/or diagnosis of SARS-CoV-2 by FDA under an Emergency Use Authorization (EUA).  This EUA will remain in effect (meaning this test can be used) for the duration of the COVID-19 declaration under Section 564(b)(1) of the Act, 21 U.S.C. section 360bbb-3(b)(1), unless the authorization is terminated or revoked sooner. Performed at [facility], [address], [place], [place] [address]   Respiratory Panel by PCR     Status: None   Collection Time: 10/22/18 11:30 PM  Result Value Ref Range Status   Adenovirus NOT DETECTED NOT DETECTED Final   Coronavirus 229E NOT DETECTED NOT DETECTED Final    Comment: (NOTE) The Coronavirus on the Respiratory Panel, DOES NOT test for the novel  Coronavirus (2019 nCoV)    Coronavirus HKU1 NOT DETECTED NOT DETECTED Final   Coronavirus NL63 NOT DETECTED NOT DETECTED Final   Coronavirus OC43 NOT DETECTED NOT DETECTED Final   Metapneumovirus NOT DETECTED NOT DETECTED Final   Rhinovirus / Enterovirus NOT DETECTED NOT DETECTED Final   Influenza A NOT DETECTED NOT DETECTED Final   Influenza B NOT DETECTED NOT DETECTED Final   Parainfluenza Virus 1 NOT DETECTED NOT DETECTED Final   Parainfluenza Virus 2 NOT DETECTED NOT DETECTED Final   Parainfluenza Virus 3 NOT DETECTED NOT DETECTED Final   Parainfluenza Virus 4 NOT DETECTED NOT DETECTED Final    Respiratory Syncytial Virus NOT DETECTED NOT DETECTED Final   Bordetella pertussis NOT DETECTED NOT DETECTED Final   Chlamydophila pneumoniae NOT DETECTED NOT DETECTED Final   Mycoplasma pneumoniae NOT DETECTED NOT DETECTED Final    Comment: Performed at [facility], [address] [place], [place] [address]  MRSA PCR Screening     Status: None   Collection Time: 10/23/18  9:44 AM  Result Value Ref Range Status   MRSA by PCR NEGATIVE NEGATIVE Final    Comment:        The GeneXpert MRSA Assay (FDA approved for NASAL specimens only), is one component of a comprehensive MRSA colonization surveillance program. It is not intended to diagnose MRSA infection nor to guide or monitor treatment for MRSA infections. Performed at [facility], [address] [place], [place] [address]   Culture, blood (routine x 2)     Status: None (Preliminary result)   Collection Time: 10/25/18  9:52 AM  Result Value Ref Range Status   Specimen Description BLOOD LEFT HAND  Final   Special Requests   Final    BOTTLES DRAWN AEROBIC AND ANAEROBIC Blood Culture results may not be optimal due to an inadequate volume of blood received in culture bottles  Culture   Final    NO GROWTH < 24 HOURS Performed at [facility], [address], [place], [place] [address]    Report Status PENDING  Incomplete  Culture, blood (routine x 2)     Status: None (Preliminary result)   Collection Time: 10/25/18  9:52 AM  Result Value Ref Range Status   Specimen Description BLOOD RIGHT HAND  Final   Special Requests   Final    BOTTLES DRAWN AEROBIC AND ANAEROBIC Blood Culture results may not be optimal due to an inadequate volume of blood received in culture bottles   Culture   Final    NO GROWTH < 24 HOURS Performed at [facility], [address] [place], [place] [address]    Report Status PENDING  Incomplete    Radiology Reports Mr Lumbar Spine Wo Contrast  Result Date: 10/25/2018 CLINICAL DATA:  Chronic low  back pain. Evaluation for vertebral osteomyelitis and/or discitis in the setting of staph aureus bacteremia. Back pain for greater than 6 weeks. EXAM: MRI LUMBAR SPINE WITHOUT CONTRAST TECHNIQUE: Multiplanar, multisequence MR imaging of the lumbar spine was performed. No intravenous contrast was administered. COMPARISON:  MRI of the lumbar spine 03/01/2018. Kyphoplasty films 03/25/2018. FINDINGS: Segmentation: 5 non rib-bearing lumbar type vertebral bodies are present. The lowest fully formed vertebral body is L5. Alignment: AP alignment is anatomic. Levoconvex curvature is centered at L1. Vertebrae: Remote fractures at L4 and L3 have been treated with spinal augmentation. Asymmetric left endplate marrow changes are present at L4-5 the similar the prior study. There is no abnormal disc signal. Remote inferior endplate fracture is present at T12. Marrow signal and vertebral body heights are maintained at L1 and L2. Conus medullaris and cauda equina: Conus extends to the L1 level. Conus and cauda equina appear normal. Paraspinal and other soft tissues: Axial images are severely degraded by patient motion. No definite solid organ lesions are present. Disc levels: L1-2: Negative. L2-3: Negative. L3-4: A broad-based disc protrusion is present. Progressive moderate central and bilateral foraminal stenosis is present, right greater than left. L4-5: A broad-based disc protrusion is present. Moderate facet hypertrophy is noted. Mild left subarticular narrowing is again seen. Moderate left and mild right foraminal narrowing is similar the prior exam. L5-S1: A shallow central disc protrusion is present. Mild facet hypertrophy is noted bilaterally. IMPRESSION: 1. No evidence for discitis or osteomyelitis. 2. Interval spinal augmentation at L4 and L3. 3. Progressive moderate central and bilateral foraminal stenosis at L3-4 is worse on the right. 4. Mild left subarticular with moderate left and mild right foraminal stenosis at  L4-5 is stable. 5. Stable scoliosis. Electronically Signed   By: Marin Erichsen M.D.   On: 10/25/2018 13:27   Dg Chest Port 1 View  Result Date: 10/22/2018 CLINICAL DATA:  Fever EXAM: PORTABLE CHEST 1 VIEW COMPARISON:  Chest radiograph July 22, 2017 and chest CT July 27, 2018 FINDINGS: There is no edema or consolidation. Heart is slightly enlarged with pulmonary vascularity normal. There is a stent extending throughout most of the aorta, stable. No adenopathy. No bone lesions. IMPRESSION: Aortic stent graft, without appreciable demonstrable change in position. Mild cardiac enlargement. No edema or consolidation. Electronically Signed   By: Bretta Bang III M.D.   On: 10/22/2018 12:13   Ct No Charge  Result Date: 10/22/2018 CLINICAL DATA:  78 year old female with a history of left hip pain. 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 EXAM: CT ANGIOGRAPHY CHEST, ABDOMEN AND PELVIS TECHNIQUE: Multidetector CT imaging  through the chest, abdomen and pelvis was performed using the standard protocol during bolus administration of intravenous contrast. Multiplanar reconstructed images and MIPs were obtained and reviewed to evaluate the vascular anatomy. CONTRAST:  100mL OMNIPAQUE IOHEXOL 350 MG/ML SOLN COMPARISON:  Multiple prior most recent 07/27/2018, 02/01/2018, 07/22/2017, 03/15/2017 FINDINGS: CTA CHEST FINDINGS Cardiovascular: Cardiomegaly. No pericardial fluid/thickening. Calcifications of left anterior descending, circumflex, right coronary arteries. Minimal calcifications of the aortic valve. Ascending aorta within normal limits. Redemonstration of overlapping stent grafts of the aortic arch and descending thoracic aorta for endovascular solution for acute aortic syndrome. The proximal margin of the stent graft is unchanged. Branch vessels remain patent. The left subclavian artery appears patent at the proximal aspect, either from antegrade flow or collateral perfusion. The stent  graft terminates in the thoracic aorta just above the hiatus. Diameter at the the hiatus measures 3.4 cm. Main pulmonary artery measures 4.1 cm. No filling defects, although the timing of the contrast bolus is suboptimal. Mediastinum/Nodes: No mediastinal adenopathy. Unremarkable thoracic inlet. There is debris within the mid and distal thoracic esophagus. Lungs/Pleura: Centrilobular emphysema. No pneumothorax. Linear scarring/atelectasis at the lung bases. No confluent airspace disease. Redemonstration of left lower lobe calcified granulomas. 4 mm nodule in the right upper lobe periphery on image 72 of series 7. 5 mm nodule in the right lower lobe on image 83 of series 7. No endotracheal or endobronchial debris. Musculoskeletal: No displaced fracture. Degenerative changes of the spine. No bony canal narrowing. Review of the MIP images confirms the above findings. CTA ABDOMEN AND PELVIS FINDINGS VASCULAR Aorta: Atherosclerosis of the abdominal aorta. The juxtarenal aorta measures 2.6 cm. Largest diameter of the infrarenal abdominal aorta measures 31 mm, unchanged from prior. 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 Greatest diameter at this site measures 32 mm, slightly more than the baseline study of 03/15/2017 and no significant change from the comparison CT. No inflammatory changes or fluid. Celiac: Celiac artery remains patent, compressed at the origin with a configuration most compatible with overlying diaphragmatic cruise. SMA: No significant atherosclerosis at the SMA origin. Renals: Minimal atherosclerosis of the left renal artery. Minimal atherosclerosis of the right main renal artery. Small accessory right renal artery above the main renal artery and a small accessory renal artery below the main right renal artery. IMA: IMA remains patent, originating from the anterior aspect of the chronic dissection/irregular plaque Right lower extremity: Atherosclerotic  changes of the right iliac system without occlusion or high-grade stenosis. Hypogastric artery remains patent. Common femoral artery with minimal atherosclerosis. Proximal profunda femoris and SFA patent. Left lower extremity: Atherosclerotic changes of the left iliac system without high-grade stenosis or occlusion. Hypogastric artery is patent. Common femoral artery with minimal atherosclerosis. Proximal profunda femoris and SFA patent. Veins: Unremarkable appearance of the venous system. Review of the MIP images confirms the above findings. NON-VASCULAR Hepatobiliary: Unremarkable appearance of the liver. Minimal calcified cholelithiasis within the dependent gallbladder. No inflammatory changes Pancreas: Unremarkable Spleen: Coarse calcifications of the spleen representing prior granulomatous disease Adrenals/Urinary Tract: Unremarkable left adrenal gland. 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 Right: Symmetric perfusion to the left kidney. No hydronephrosis. No nephrolithiasis. Unremarkable right ureter. Small lesion on the lateral cortex may represent hemorrhagic cyst, incompletely characterized on the current study. Left: Symmetric perfusion to the right. No hydronephrosis. Nonobstructing stone at the inferior collecting system. Cyst at the superior cortex of the left kidney. Additional small lesions of the left kidney are too small to  characterize. Unremarkable urinary bladder. Stomach/Bowel: Hiatal hernia. Unremarkable stomach. Unremarkable appearance of small bowel. No evidence of obstruction. Surgical changes of appendectomy. Colonic diverticula with no acute inflammatory changes. Lymphatic: No adenopathy. Mesenteric: No free fluid or air. No adenopathy. Reproductive: Hysterectomy Other: Surgical clips along the midline abdomen of prior surgery. No abdominal hernia Musculoskeletal: Scoliotic curvature of the thoracolumbar spine. Changes of vertebral augmentation of L3 and L4. Degenerative changes of  the thoracolumbar spine. No acute displaced fracture. Left lower extremity/hip: Mild degenerative changes of the left sacroiliac joint with no acute displaced fracture. No pelvic fracture identified. Left hip is congruent with no subluxation/dislocation. Mild joint space narrowing symmetric to the right. No femoral fracture identified. No left-sided joint effusion. Unremarkable appearance of the left hip musculature with no focal soft tissue swelling. No soft tissue lesion. No lymphadenopathy. IMPRESSION: No acute CT finding of the chest, abdomen, pelvis, or left hip. Redemonstration of endovascular repair of thoracic aorta with overlapping stent grafts of the aortic arch and descending thoracic aorta. 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 The aorta just above this plaque/dissection measures also 3.1 cm, unchanged. Aortic atherosclerosis. Aortic Atherosclerosis (ICD10-I70.0). Associated coronary artery disease. Enlarged main pulmonary artery suggesting early pulmonary hypertension. Additional ancillary findings as above. Signed, Yvone Neu. Reyne Dumas, RPVI [facility] Radiology Electronically Signed   By: Gilmer Mor D.O.   On: 10/22/2018 16:35   Ct Angio Chest/abd/pel For Dissection W And/or W/wo  Result Date: 10/22/2018 CLINICAL DATA:  78 year old female with a history of left hip pain. 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 EXAM: CT ANGIOGRAPHY CHEST, ABDOMEN AND PELVIS TECHNIQUE: Multidetector CT imaging through the chest, abdomen and pelvis was performed using the standard protocol during bolus administration of intravenous contrast. Multiplanar reconstructed images and MIPs were obtained and reviewed to evaluate the vascular anatomy. CONTRAST:  OMNIPAQUE IOHEXOL 350 MG/ML SOLN COMPARISON:  Multiple prior most recent  07/27/2018, 02/01/2018, 07/22/2017, 03/15/2017 FINDINGS: CTA CHEST FINDINGS Cardiovascular: Cardiomegaly. No pericardial fluid/thickening. Calcifications of left anterior descending, circumflex, right coronary arteries. Minimal calcifications of the aortic valve. Ascending aorta within normal limits. Redemonstration of overlapping stent grafts of the aortic arch and descending thoracic aorta for endovascular solution for acute aortic syndrome. The proximal margin of the stent graft is unchanged. Branch vessels remain patent. The left subclavian artery appears patent at the proximal aspect, either from antegrade flow or collateral perfusion. The stent graft terminates in the thoracic aorta just above the hiatus. Diameter at the the hiatus measures 3.4 cm. Main pulmonary artery measures 4.1 cm. No filling defects, although the timing of the contrast bolus is suboptimal. Mediastinum/Nodes: No mediastinal adenopathy. Unremarkable thoracic inlet. There is debris within the mid and distal thoracic esophagus. Lungs/Pleura: Centrilobular emphysema. No pneumothorax. Linear scarring/atelectasis at the lung bases. No confluent airspace disease. Redemonstration of left lower lobe calcified granulomas. 4 mm nodule in the right upper lobe periphery on image 72 of series 7. 5 mm nodule in the right lower lobe on image 83 of series 7. No endotracheal or endobronchial debris. Musculoskeletal: No displaced fracture. Degenerative changes of the spine. No bony canal narrowing. Review of the MIP images confirms the above findings. CTA ABDOMEN AND PELVIS FINDINGS VASCULAR Aorta: Atherosclerosis of the abdominal aorta. The juxtarenal aorta measures 2.6 cm. Largest diameter of the infrarenal abdominal aorta measures 31 mm, unchanged from prior. Similar appearance of chronic dissection/irregular plaque  at the level of the IMA origin. Greatest diameter at this site measures 32 mm, slightly more than the baseline study of 03/15/2017 and no  significant change from the comparison CT. No inflammatory changes or fluid. Celiac: Celiac artery remains patent, compressed at the origin with a configuration most compatible with overlying diaphragmatic cruise. SMA: No significant atherosclerosis at the SMA origin. Renals: Minimal atherosclerosis of the left renal artery. Minimal atherosclerosis of the right main renal artery. Small accessory right renal artery above the main renal artery and a small accessory renal artery below the main right renal artery. IMA: IMA remains patent, originating from the anterior aspect of the chronic dissection/irregular plaque Right lower extremity: Atherosclerotic changes of the right iliac system without occlusion or high-grade stenosis. Hypogastric artery remains patent. Common femoral artery with minimal atherosclerosis. Proximal profunda femoris and SFA patent. Left lower extremity: Atherosclerotic changes of the left iliac system without high-grade stenosis or occlusion. Hypogastric artery is patent. Common femoral artery with minimal atherosclerosis. Proximal profunda femoris and SFA patent. Veins: Unremarkable appearance of the venous system. Review of the MIP images confirms the above findings. NON-VASCULAR Hepatobiliary: Unremarkable appearance of the liver. Minimal calcified cholelithiasis within the dependent gallbladder. No inflammatory changes Pancreas: Unremarkable Spleen: Coarse calcifications of the spleen representing prior granulomatous disease Adrenals/Urinary Tract: Unremarkable left adrenal gland. 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 Right: Symmetric perfusion to the left kidney. No hydronephrosis. No nephrolithiasis. Unremarkable right ureter. Small lesion on the lateral cortex may represent hemorrhagic cyst, incompletely characterized on the current study. Left: Symmetric perfusion to the right. No hydronephrosis. Nonobstructing stone at the inferior collecting system. Cyst at the superior cortex of  the left kidney. Additional small lesions of the left kidney are too small to characterize. Unremarkable urinary bladder. Stomach/Bowel: Hiatal hernia. Unremarkable stomach. Unremarkable appearance of small bowel. No evidence of obstruction. Surgical changes of appendectomy. Colonic diverticula with no acute inflammatory changes. Lymphatic: No adenopathy. Mesenteric: No free fluid or air. No adenopathy. Reproductive: Hysterectomy Other: Surgical clips along the midline abdomen of prior surgery. No abdominal hernia Musculoskeletal: Scoliotic curvature of the thoracolumbar spine. Changes of vertebral augmentation of L3 and L4. Degenerative changes of the thoracolumbar spine. No acute displaced fracture. Left lower extremity/hip: Mild degenerative changes of the left sacroiliac joint with no acute displaced fracture. No pelvic fracture identified. Left hip is congruent with no subluxation/dislocation. Mild joint space narrowing symmetric to the right. No femoral fracture identified. No left-sided joint effusion. Unremarkable appearance of the left hip musculature with no focal soft tissue swelling. No soft tissue lesion. No lymphadenopathy. IMPRESSION: No acute CT finding of the chest, abdomen, pelvis, or left hip. Redemonstration of endovascular repair of thoracic aorta with overlapping stent grafts of the aortic arch and descending thoracic aorta. 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 The aorta just above this plaque/dissection measures also 3.1 cm, unchanged. Aortic atherosclerosis. Aortic Atherosclerosis (ICD10-I70.0). Associated coronary artery disease. Enlarged main pulmonary artery suggesting early pulmonary hypertension. Additional ancillary findings as above. Signed, Yvone Neu. Reyne Dumas, RPVI [facility] Radiology Electronically Signed   By: Gilmer Mor D.O.   On: 10/22/2018 16:35     CBC Recent Labs  Lab 10/22/18 1128 10/23/18 0601 10/24/18 0554 10/25/18 0527 10/26/18 0603  WBC 10.1 20.2* 11.9* 5.9 4.9  HGB 11.5* 12.3 11.5* 11.0* 11.2*  HCT 35.2* 38.9 36.4 35.5* 35.8*  PLT 142*  161 158 169 177  MCV 93.9 95.1 96.6 96.5 95.2  MCH 30.7 30.1 30.5 29.9 29.8  MCHC 32.7 31.6 31.6 31.0 31.3  RDW 15.1 14.7 15.0 14.9 14.6  LYMPHSABS 0.4*  --   --   --   --   MONOABS 1.2*  --   --   --   --   EOSABS 0.0  --   --   --   --   BASOSABS 0.0  --   --   --   --     Chemistries  Recent Labs  Lab 10/22/18 1128 10/23/18 0601 10/24/18 0554 10/25/18 0527 10/26/18 0603  NA 137 136 137 136 136  K 3.1* 3.5 4.5 4.7 4.8  CL 98 97* 100 102 99  CO2 GLUCOSE 122* 156* 118* 97 101*  BUN CREATININE 0.61 0.60 0.65 0.64 0.57  CALCIUM 8.7* 8.8* 9.4 9.1 9.0  AST 11*  --   --   --   --   ALT <5  --   --   --   --   ALKPHOS 69  --   --   --   --   BILITOT 1.5*  --   --   --   --    ------------------------------------------------------------------------------------------------------------------ No results for input(s): CHOL, HDL, LDLCALC, TRIG, CHOLHDL, LDLDIRECT in the last 72 hours.  No results found for: HGBA1C ------------------------------------------------------------------------------------------------------------------ No results for input(s): TSH, T4TOTAL, T3FREE, THYROIDAB in the last 72 hours.  Invalid input(s): FREET3 ------------------------------------------------------------------------------------------------------------------ No results for input(s): VITAMINB12, FOLATE, FERRITIN, TIBC, IRON, RETICCTPCT in the last 72 hours.  Coagulation profile No results for input(s): INR, PROTIME in the last 168 hours.  No results for input(s): DDIMER in the last 72 hours.  Cardiac Enzymes Recent Labs  Lab 10/22/18 1128  TROPONINI <0.03    ------------------------------------------------------------------------------------------------------------------    Component Value Date/Time   BNP 101.0 (H) 10/22/2018 1128     Shon Hale M.D on 10/26/2018 at 5:41 PM  Go to [email or web address] - for contact info  [facility] - Office  [phone]

## 2018-10-26 NOTE — Progress Notes (Signed)
Champ note 78 yo F with prev thoracic aneurysm repair as well as lumbar kyphoplasty. She now has L buttock pain, MSSA bacteremia as well as fever/hypoxia/confusion.  She has a hx of ? PEN allergy. Her anbx were changed from vanco to ancef today.   Her studies for spine and previous aortic repair do not show evidence of disseminated infection.  I would consider giving her 2 weeks of ancef followed by 2 weeks of keflex provided her repeat BCx remain negative.  This is a prolonged course given her aortic repair.  She will need repeat BCx 1 week after she completes antibiotics.  Will follow peripherally.       [facility] Staphylococcus aureus bacteremia   Staphylococcus aureus bacteremia (SAB) is associated with a high rate of complications and mortality.  Specific aspects of clinical management are critical to optimizing the outcome of patients with SAB.  Therefore, the [facility] Antimicrobial Management Team [facility]) has initiated an intervention aimed at improving the management of SAB at [facility].  To do so, Infectious Diseases physicians are providing an evidence-based consult for the management of all patients with SAB.     Yes No Comments  Perform follow-up blood cultures (even if the patient is afebrile) to ensure clearance of bacteremia [x]  []  Done 5-26, pending  Remove vascular catheter and obtain follow-up blood cultures after the removal of the catheter []  [x]    Perform echocardiography to evaluate for endocarditis (transthoracic ECHO is 40-50% sensitive, TEE is > 90% sensitive) [x]  []  TEE (-)  Consult electrophysiologist to evaluate implanted cardiac device (pacemaker, ICD) []  [x]    Ensure source control []  []  Have all abscesses been drained effectively? Have deep seeded infections (septic joints or osteomyelitis) had appropriate surgical debridement?  Investigate for "metastatic" sites of infection [x]  []  MRI of spine does not show  osteo-discitis. No acute CT finding of the chest, abdomen, pelvis, or left hip.  Change antibiotic therapy to ______ancef____________ [x]  []  10-26-18  Estimated duration of IV antibiotic therapy:   []  []  Consult case management for probably prolonged outpatient IV antibiotic therapy

## 2018-10-26 NOTE — Progress Notes (Signed)
Physical Therapy Treatment Patient Details Name: Briana Dean MRN: [id number] DOB: 06/12/1940 Today's Date: 10/26/2018    History of Present Illness Briana Dean is a 78 y.o. 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  Patient presents with left hip pain that started 1 to 2 days ago and is located in her left buttock and radiates down leg.  Pain is 10 out of 10.  Pain is same location as her chronic hip and leg pain, but much more severe.  No recent falls, injuries.  Patient states that movement appears to improve her pain, as well as massage.  No provoking factors.    PT Comments    Patient presents very lethargic and drowsiness possibly due to medications prior to a procedure in early AM.  Patient able to participate and c/o low back pain, ambulated to bathroom to urinate prior to attempting exercises, demonstrated poor follow through for completing BLE ROM/strengthening exercises due to drowsiness, was able to ambulate just out side of room and back to bedside without loss of balance.  Patient tolerated sitting up in chair with legs elevated after therapy - nursing staff notified.  Patient will benefit from continued physical therapy in hospital and recommended venue below to increase strength, balance, endurance for safe ADLs and gait.    Follow Up Recommendations  Outpatient PT     Equipment Recommendations  None recommended by PT    Recommendations for Other Services       Precautions / Restrictions Precautions Precautions: Fall Restrictions Weight Bearing Restrictions: No    Mobility  Bed Mobility Overal bed mobility: Modified Independent             General bed mobility comments: increased time  Transfers Overall transfer level: Needs assistance Equipment used: Rolling walker (2  wheeled) Transfers: Sit to/from [employer] Sit to Stand: Min guard;Min assist Stand pivot transfers: Min guard;Min assist       General transfer comment: labored movement, occasional verbal cues for proper hand placement  Ambulation/Gait Ambulation/Gait assistance: Min guard Gait Distance (Feet): 22 Feet Assistive device: Rolling walker (2 wheeled) Gait Pattern/deviations: Decreased step length - right;Decreased step length - left;Decreased stride length Gait velocity: slow   General Gait Details: slow labored cadence with short slightly unsteady steps, excessive external rotation LLE, limited secondary to c/o fatigue, drowsiness   Stairs             Wheelchair Mobility    Modified Rankin (Stroke Patients Only)       Balance Overall balance assessment: Needs assistance Sitting-balance support: Feet supported;No upper extremity supported Sitting balance-Leahy Scale: Fair     Standing balance support: Bilateral upper extremity supported;During functional activity Standing balance-Leahy Scale: Fair Standing balance comment: using RW                            Cognition Arousal/Alertness: Lethargic;Suspect due to medications Behavior During Therapy: [facility] for tasks assessed/performed Overall Cognitive Status: Within Functional Limits for tasks assessed                                 General Comments: lethargic due medications taken before a procedure earlier in AM      Exercises General Exercises - Lower Extremity Long Arc  Quad: Seated;Strengthening;AROM;Both;5 reps Hip Flexion/Marching: Seated;Strengthening;AROM;Both;5 reps Toe Raises: Seated;Strengthening;AROM;Both;5 reps Heel Raises: Seated;AROM;Strengthening;Both;5 reps    General Comments        Pertinent Vitals/Pain Pain Assessment: Faces Faces Pain Scale: Hurts even more Pain Location: low back Pain Descriptors / Indicators: Aching;Sore Pain  Intervention(s): Limited activity within patient's tolerance;Monitored during session    Home Living                      Prior Function            PT Goals (current goals can now be found in the care plan section) Acute Rehab PT Goals Patient Stated Goal: return home PT Goal Formulation: With patient Time For Goal Achievement: 10/31/18 Potential to Achieve Goals: Good Progress towards PT goals: Progressing toward goals    Frequency    Min 3X/week      PT Plan Current plan remains appropriate    Co-evaluation              AM-PAC PT "6 Clicks" Mobility   Outcome Measure  Help needed turning from your back to your side while in a flat bed without using bedrails?: None Help needed moving from lying on your back to sitting on the side of a flat bed without using bedrails?: None Help needed moving to and from a bed to a chair (including a wheelchair)?: A Little Help needed standing up from a chair using your arms (e.g., wheelchair or bedside chair)?: A Little Help needed to walk in hospital room?: A Little Help needed climbing 3-5 steps with a railing? : A Lot 6 Click Score: 19    End of Session Equipment Utilized During Treatment: Gait belt Activity Tolerance: Patient tolerated treatment well;Patient limited by fatigue;Patient limited by lethargy Patient left: in chair;with call bell/phone within reach;with chair alarm set Nurse Communication: Mobility status PT Visit Diagnosis: Unsteadiness on feet (R26.81);Difficulty in walking, not elsewhere classified (R26.2);Other abnormalities of gait and mobility (R26.89);Pain     Time: [id number] PT Time Calculation (min) (ACUTE ONLY): 34 min  Charges:  $Therapeutic Exercise: 8-22 mins $Therapeutic Activity: 8-22 mins                     12:25 PM, 10/26/18 Ocie BobJames Alias Villagran, MPT Physical Therapist with [facility] [phone] office [phone] mobile phone

## 2018-10-26 NOTE — Progress Notes (Signed)
  Echocardiogram Echocardiogram Transesophageal has been performed.  Briana Dean 10/26/2018, 9:53 AM

## 2018-10-27 ENCOUNTER — Encounter (HOSPITAL_COMMUNITY): Payer: Self-pay | Admitting: Cardiology

## 2018-10-27 LAB — CBC
HCT: 35.4 % — ABNORMAL LOW (ref 36.0–46.0)
Hemoglobin: 11.1 g/dL — ABNORMAL LOW (ref 12.0–15.0)
MCH: 29.8 pg (ref 26.0–34.0)
MCHC: 31.4 g/dL (ref 30.0–36.0)
MCV: 95.2 fL (ref 80.0–100.0)
Platelets: 185 10*3/uL (ref 150–400)
RBC: 3.72 MIL/uL — ABNORMAL LOW (ref 3.87–5.11)
RDW: 14.3 % (ref 11.5–15.5)
WBC: 5.3 10*3/uL (ref 4.0–10.5)
nRBC: 0 % (ref 0.0–0.2)

## 2018-10-27 LAB — BASIC METABOLIC PANEL
Anion gap: 7 (ref 5–15)
BUN: 14 mg/dL (ref 8–23)
CO2: 31 mmol/L (ref 22–32)
Calcium: 9.3 mg/dL (ref 8.9–10.3)
Chloride: 99 mmol/L (ref 98–111)
Creatinine, Ser: 0.62 mg/dL (ref 0.44–1.00)
GFR calc Af Amer: >60 mL/min (ref >60)
GFR calc non Af Amer: >60 mL/min (ref >60)
Glucose, Bld: 112 mg/dL — ABNORMAL HIGH (ref 70–99)
Potassium: 4.6 mmol/L (ref 3.5–5.1)
Sodium: 137 mmol/L (ref 135–145)

## 2018-10-27 MED ORDER — ALBUTEROL SULFATE (2.5 MG/3ML) 0.083% IN NEBU: 3.0000 mL | INHALATION_SOLUTION | RESPIRATORY_TRACT | Status: DC | PRN

## 2018-10-27 NOTE — Progress Notes (Signed)
Physical Therapy Treatment Patient Details Name: Briana Dean MRN: [id number] DOB: Oct 26, 1940 Today's Date: 10/27/2018    History of Present Illness Briana Dean is a 78 y.o. 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  Patient presents with left hip pain that started 1 to 2 days ago and is located in her left buttock and radiates down leg.  Pain is 10 out of 10.  Pain is same location as her chronic hip and leg pain, but much more severe.  No recent falls, injuries.  Patient states that movement appears to improve her pain, as well as massage.  No provoking factors.    PT Comments    Pt limited by pain, RN informed and pre-medicated prior therapy session.  Pt lethargic believed due to pain medication with increased time to complete steps and some slurred speech/diffuculty completing sentences.  Though lethergic, pt able to complete total session with safe mechanics.  Mod I bed mobility, just increased time to complete.  Pt with safe mechanics with sit to stands and steady upon standing.  Able to ambulate 69ft prior fatigue, no LOB episodes just slow cadence.  EOS pt left in chair with call bell within reach, chair alarm set and NT request to replace pure wick.  Pt reports pain reduced at EOS.   Follow Up Recommendations  Outpatient PT     Equipment Recommendations  None recommended by PT    Recommendations for Other Services       Precautions / Restrictions Precautions Precautions: Fall Restrictions Weight Bearing Restrictions: No    Mobility  Bed Mobility Overal bed mobility: Independent                Transfers Overall transfer level: Modified independent Equipment used: Rolling walker (2 wheeled) Transfers: Sit to/from Stand Sit to Stand: Min guard;Supervision         General transfer  comment: Good safe mechanics used.  Stable upon standing with use of RW  Ambulation/Gait Ambulation/Gait assistance: Min guard Gait Distance (Feet): 22 Feet Assistive device: Rolling walker (2 wheeled) Gait Pattern/deviations: Decreased step length - right;Decreased step length - left;Decreased stride length Gait velocity: slow   General Gait Details: slow labored cadence with short slightly unsteady steps, excessive external rotation LLE, limited secondary to c/o fatigue, drowsiness   Stairs             Wheelchair Mobility    Modified Rankin (Stroke Patients Only)       Balance                                            Cognition Arousal/Alertness: Lethargic;Suspect due to medications Behavior During Therapy: WFL for tasks assessed/performed Overall Cognitive Status: Within Functional Limits for tasks assessed                                 General Comments: lethargic due medications for pain prior tx      Exercises General Exercises - Lower Extremity Long Arc Quad: Seated;Strengthening;AROM;Both;10 reps Hip Flexion/Marching: Seated;Strengthening;AROM;Both;10 reps Toe Raises: Seated;Strengthening;AROM;Both;5 reps Heel Raises: Seated;AROM;Strengthening;Both;5 reps    General Comments        Pertinent Vitals/Pain Faces Pain Scale: Hurts  even more Pain Location: low back and left hip Pain Descriptors / Indicators: Aching;Sore Pain Intervention(s): Premedicated before session;Monitored during session;Limited activity within patient's tolerance;Repositioned    Home Living                      Prior Function            PT Goals (current goals can now be found in the care plan section)      Frequency    Min 3X/week      PT Plan Current plan remains appropriate    Co-evaluation              AM-PAC PT "6 Clicks" Mobility   Outcome Measure  Help needed turning from your back to your side while in a  flat bed without using bedrails?: None Help needed moving from lying on your back to sitting on the side of a flat bed without using bedrails?: None Help needed moving to and from a bed to a chair (including a wheelchair)?: A Little Help needed standing up from a chair using your arms (e.g., wheelchair or bedside chair)?: A Little Help needed to walk in hospital room?: A Little Help needed climbing 3-5 steps with a railing? : A Lot 6 Click Score: 19    End of Session Equipment Utilized During Treatment: Gait belt Activity Tolerance: Patient tolerated treatment well;Patient limited by fatigue;Patient limited by lethargy;Patient limited by pain Patient left: in chair;with call bell/phone within reach;with chair alarm set Nurse Communication: Mobility status PT Visit Diagnosis: Unsteadiness on feet (R26.81);Difficulty in walking, not elsewhere classified (R26.2);Other abnormalities of gait and mobility (R26.89);Pain Pain - Right/Left: Left Pain - part of body: Hip     Time: [id number] PT Time Calculation (min) (ACUTE ONLY): 30 min  Charges:  $Therapeutic Exercise: 8-22 mins $Therapeutic Activity: 8-22 mins                    [address]Casey Cockerham, LPTA; CBIS [phone]   Juel BurrowCockerham, Casey Jo 10/27/2018, 12:00 PM

## 2018-10-27 NOTE — Progress Notes (Signed)
Patient Demographics:    Briana Dean, is a 78 y.o. female, DOB - 09/04/1940, ZOX:[id number]  Admit date - 10/22/2018   Admitting Physician Pratik Hoover Brunette Shah, DO  Outpatient Primary MD for the patient is Samuel JesterButler, Cynthia, DO  LOS - 5   Chief Complaint  Patient presents with   Hip Pain        Subjective:    Briana OleaLou Soter today has no fevers, no emesis,  No chest pain, less confused, more cooperative  Assessment  & Plan :    Principal Problem:   Fever Active Problems:   Congestive heart failure (HCC)   Ruptured aneurysm of thoracic aorta (HCC)   Essential hypertension   Hypokalemia   Hip pain, left   Hypoxia   Brief Summary:- 78 yo F with prev thoracic aortic aneurysm repair as well as lumbar kyphoplasty admitted 10/22/18 with L buttock pain, MSSA bacteremia as well as fever/hypoxia/confusion.  Her studies for spine and previous aortic repair do not show evidence of disseminated infection.  .   A/p 1)MSSA Bacteremia--- repeat blood cultures from 10/25/2018 NGTD, TEE negative on 10/26/2018,  infectious disease consult/input from Dr. Johny SaxJeffrey Hatcher appreciated... Recommends IV Ancef for 2 weeks starting 10/26/2018 followed by 2 weeks of p.o. Keflex if repeat blood cultures from 10/25/18 remain negative, This is a prolonged course given her aortic repair. patient will need repeat blood cultures 1 week after completing antibiotics. MRSA screen is Negative.   2)HTN--- stable, c/n Atenolol 25 mg po bid, continue lisinopril 10 mg daily  3)HFpEF--- stable, last known EF is 60 to 65 %, c/n Lisinopril 10 mg daily, Lasix 20 mg daily,   4)HLD--- c/n Lipitor 40 mg and aspirin,   5)COPD-stable, c/n Dulera, change albuterol nebs to as needed  6)Dementia with behavioral disturbance--- lorazepam as needed agitation and restlessness, continue Paxil 20 mg daily  7) movement disorder--- stable, continue Sinemet and  Requip  Disposition/Need for in-Hospital Stay- patient unable to be discharged at this time due to MSSA bacteremia requiring iv Antibiotics.... Patient needs SNF rehab and skilled services for IV antibiotic completion  Code Status : FULL  Family Communication:   Na   Disposition Plan  : FULL  Consults  :  Cardiology/Infectious Disease  DVT Prophylaxis  :  Lovenox -  Lab Results  Component Value Date   PLT 185 10/27/2018    Inpatient Medications  Scheduled Meds:  albuterol  3 mL Nebulization Q6H WA   aspirin EC  81 mg Oral Daily   atenolol  25 mg Oral BID   atorvastatin  40 mg Oral Daily   carbidopa-levodopa  1 tablet Oral Daily   enoxaparin (LOVENOX) injection  40 mg Subcutaneous Q24H   famotidine  20 mg Oral Daily   feeding supplement (ENSURE ENLIVE)  237 mL Oral BID BM   furosemide  20 mg Oral Daily   lisinopril  10 mg Oral Daily   mometasone-formoterol  2 puff Inhalation BID   multivitamin with minerals  1 tablet Oral Daily   PARoxetine  20 mg Oral Daily   potassium chloride  30 mEq Oral BID   rOPINIRole  0.5 mg Oral q morning - 10a   rOPINIRole  1 mg Oral QHS   Continuous Infusions:  ceFAZolin (ANCEF) IV 2 g (10/27/18 0518)   PRN Meds:.HYDROcodone-acetaminophen, ipratropium-albuterol, LORazepam, Muscle Rub, ondansetron **OR** ondansetron (ZOFRAN) IV, polyethylene glycol, traMADol    Anti-infectives (From admission, onward)   Start     Dose/Rate Route Frequency Ordered Stop   10/26/18 1400  ceFAZolin (ANCEF) IVPB 2g/100 mL premix     2 g 200 mL/hr over 30 Minutes Intravenous Every 8 hours 10/26/18 0953     10/23/18 2200  metroNIDAZOLE (FLAGYL) IVPB 500 mg  Status:  Discontinued     500 mg 100 mL/hr over 60 Minutes Intravenous Every 8 hours 10/22/18 2037 10/24/18 1244   10/23/18 0400  vancomycin (VANCOCIN) IVPB 750 mg/150 ml premix  Status:  Discontinued     750 mg 150 mL/hr over 60 Minutes Intravenous Every 12 hours 10/22/18 1440 10/25/18  0801   10/22/18 2200  ceFEPIme (MAXIPIME) 2 g in sodium chloride 0.9 % 100 mL IVPB  Status:  Discontinued     2 g 200 mL/hr over 30 Minutes Intravenous Every 8 hours 10/22/18 1440 10/26/18 0952   10/22/18 1500  vancomycin (VANCOCIN) IVPB 1000 mg/200 mL premix     1,000 mg 200 mL/hr over 60 Minutes Intravenous  Once 10/22/18 1434 10/22/18 1922   10/22/18 1130  ceFEPIme (MAXIPIME) 2 g in sodium chloride 0.9 % 100 mL IVPB     2 g 200 mL/hr over 30 Minutes Intravenous  Once 10/22/18 1116 10/22/18 1309   10/22/18 1130  metroNIDAZOLE (FLAGYL) IVPB 500 mg     500 mg 100 mL/hr over 60 Minutes Intravenous  Once 10/22/18 1116 10/22/18 1515   10/22/18 1130  vancomycin (VANCOCIN) IVPB 1000 mg/200 mL premix     1,000 mg 200 mL/hr over 60 Minutes Intravenous  Once 10/22/18 1116 10/22/18 1412        Objective:   Vitals:   10/27/18 0615 10/27/18 0810 10/27/18 0816 10/27/18 1320  BP:      Pulse:      Resp:      Temp: 98.7 F (37.1 C)     TempSrc: Oral     SpO2:  100% 100% 99%  Weight:      Height:        Wt Readings from Last 3 Encounters:  10/27/18 79.2 kg  08/02/18 83.3 kg  03/25/18 81.6 kg     Intake/Output Summary (Last 24 hours) at 10/27/2018 1451 Last data filed at 10/27/2018 0325 Gross per 24 hour  Intake 176.03 ml  Output --  Net 176.03 ml     Physical Exam Patient is examined daily including today on 10/27/18 , exams remain the same as of yesterday except that has changed   Gen:- Awake Alert, In no acute distress  HEENT:- [place].AT, No sclera icterus Neck-Supple Neck,No JVD,.  Lungs-  CTAB , fair symmetrical air movement CV- S1, S2 normal, regular  Abd-  +ve B.Sounds, Abd Soft, No tenderness,    Extremity/Skin:- No  edema, pedal pulses present  Psych-confusional episodes and disorientation persist  neuro-generalized weakness, no new focal deficits, no tremors   Data Review:   Micro Results Recent Results (from the past 240 hour(s))  Blood Culture (routine x 2)      Status: Abnormal   Collection Time: 10/22/18 11:29 AM  Result Value Ref Range Status   Specimen Description   Final    BLOOD LEFT ANTECUBITAL Performed at [facility], [address], [place], [place] [address]    Special Requests   Final    BOTTLES DRAWN  AEROBIC AND ANAEROBIC Blood Culture adequate volume Performed at [facility], [address], [place], [place] [address]    Culture  Setup Time   Final    GRAM POSITIVE COCCI Gram Stain Report Called to,Read Back By and Verified With: JOHNSON,B @ 0203 ON 10/23/18 BY JUW GS DONE @ [facility] Performed at [facility], [address] [place], [place] [address]    Culture (A)  Final    STAPHYLOCOCCUS AUREUS SUSCEPTIBILITIES PERFORMED ON PREVIOUS CULTURE WITHIN THE LAST 5 DAYS. Performed at [facility], [address] [place], [place] [address]    Report Status 10/25/2018 FINAL  Final  Blood Culture (routine x 2)     Status: Abnormal   Collection Time: 10/22/18 11:29 AM  Result Value Ref Range Status   Specimen Description   Final    BLOOD BLOOD RIGHT WRIST Performed at [facility], [address] [place], [place] [address]    Special Requests   Final    BOTTLES DRAWN AEROBIC AND ANAEROBIC Blood Culture adequate volume Performed at [facility], [address], [place], [place] [address]    Culture  Setup Time   Final    GRAM POSITIVE COCCI Gram Stain Report Called to,Read Back By and Verified With: GRAVES,K @ 0042 ON 10/23/18 BY JUW GS DONE @ [facility] Performed at [facility], [address] [place], [place] [address]    Culture STAPHYLOCOCCUS AUREUS (A)  Final   Report Status 10/25/2018 FINAL  Final   Organism ID, Bacteria STAPHYLOCOCCUS AUREUS  Final      Susceptibility   Staphylococcus aureus - MIC*    CIPROFLOXACIN <=0.5 SENSITIVE Sensitive     ERYTHROMYCIN <=0.25 SENSITIVE Sensitive     GENTAMICIN <=0.5 SENSITIVE Sensitive     OXACILLIN 0.5 SENSITIVE Sensitive     TETRACYCLINE <=1 SENSITIVE Sensitive     VANCOMYCIN  <=0.5 SENSITIVE Sensitive     TRIMETH/SULFA <=10 SENSITIVE Sensitive     CLINDAMYCIN <=0.25 SENSITIVE Sensitive     RIFAMPIN <=0.5 SENSITIVE Sensitive     Inducible Clindamycin NEGATIVE Sensitive     * STAPHYLOCOCCUS AUREUS  SARS Coronavirus 2 (CEPHEID- Performed in [facility] hospital lab), Hosp Order     Status: None   Collection Time: 10/22/18 12:37 PM  Result Value Ref Range Status   SARS Coronavirus 2 NEGATIVE NEGATIVE Final    Comment: (NOTE) If result is NEGATIVE SARS-CoV-2 target nucleic acids are NOT DETECTED. The SARS-CoV-2 RNA is generally detectable in upper and lower  respiratory specimens during the acute phase of infection. The lowest  concentration of SARS-CoV-2 viral copies this assay can detect is 250  copies / mL. A negative result does not preclude SARS-CoV-2 infection  and should not be used as the sole basis for treatment or other  patient management decisions.  A negative result may occur with  improper specimen collection / handling, submission of specimen other  than nasopharyngeal swab, presence of viral mutation(s) within the  areas targeted by this assay, and inadequate number of viral copies  (<250 copies / mL). A negative result must be combined with clinical  observations, patient history, and epidemiological information. If result is POSITIVE SARS-CoV-2 target nucleic acids are DETECTED. The SARS-CoV-2 RNA is generally detectable in upper and lower  respiratory specimens dur ing the acute phase of infection.  Positive  results are indicative of active infection with SARS-CoV-2.  Clinical  correlation with patient history and other diagnostic information is  necessary to determine patient  infection status.  Positive results do  not rule out bacterial infection or co-infection with other viruses. If result is PRESUMPTIVE POSTIVE SARS-CoV-2 nucleic acids MAY BE PRESENT.   A presumptive positive result was obtained on the submitted specimen  and  confirmed on repeat testing.  While 2019 novel coronavirus  (SARS-CoV-2) nucleic acids may be present in the submitted sample  additional confirmatory testing may be necessary for epidemiological  and / or clinical management purposes  to differentiate between  SARS-CoV-2 and other Sarbecovirus currently known to infect humans.  If clinically indicated additional testing with an alternate test  methodology [id number]) is advised. The SARS-CoV-2 RNA is generally  detectable in upper and lower respiratory sp ecimens during the acute  phase of infection. The expected result is Negative. Fact Sheet for Patients:  BoilerBrush.com.cy Fact Sheet for Healthcare Providers: https://pope.com/ This test is not yet approved or cleared by the [place] FDA and has been authorized for detection and/or diagnosis of SARS-CoV-2 by FDA under an Emergency Use Authorization (EUA).  This EUA will remain in effect (meaning this test can be used) for the duration of the COVID-19 declaration under Section 564(b)(1) of the Act, 21 U.S.C. section 360bbb-3(b)(1), unless the authorization is terminated or revoked sooner. Performed at [facility], [address] [place], [place] [address]   Respiratory Panel by PCR     Status: None   Collection Time: 10/22/18 11:30 PM  Result Value Ref Range Status   Adenovirus NOT DETECTED NOT DETECTED Final   Coronavirus 229E NOT DETECTED NOT DETECTED Final    Comment: (NOTE) The Coronavirus on the Respiratory Panel, DOES NOT test for the novel  Coronavirus (2019 nCoV)    Coronavirus HKU1 NOT DETECTED NOT DETECTED Final   Coronavirus NL63 NOT DETECTED NOT DETECTED Final   Coronavirus OC43 NOT DETECTED NOT DETECTED Final   Metapneumovirus NOT DETECTED NOT DETECTED Final   Rhinovirus / Enterovirus NOT DETECTED NOT DETECTED Final   Influenza A NOT DETECTED NOT DETECTED Final   Influenza B NOT DETECTED NOT DETECTED Final    Parainfluenza Virus 1 NOT DETECTED NOT DETECTED Final   Parainfluenza Virus 2 NOT DETECTED NOT DETECTED Final   Parainfluenza Virus 3 NOT DETECTED NOT DETECTED Final   Parainfluenza Virus 4 NOT DETECTED NOT DETECTED Final   Respiratory Syncytial Virus NOT DETECTED NOT DETECTED Final   Bordetella pertussis NOT DETECTED NOT DETECTED Final   Chlamydophila pneumoniae NOT DETECTED NOT DETECTED Final   Mycoplasma pneumoniae NOT DETECTED NOT DETECTED Final    Comment: Performed at [facility], [address] [place], [place] [address]  MRSA PCR Screening     Status: None   Collection Time: 10/23/18  9:44 AM  Result Value Ref Range Status   MRSA by PCR NEGATIVE NEGATIVE Final    Comment:        The GeneXpert MRSA Assay (FDA approved for NASAL specimens only), is one component of a comprehensive MRSA colonization surveillance program. It is not intended to diagnose MRSA infection nor to guide or monitor treatment for MRSA infections. Performed at [facility], [address] [place], [place] [address]   Culture, blood (routine x 2)     Status: None (Preliminary result)   Collection Time: 10/25/18  9:52 AM  Result Value Ref Range Status   Specimen Description BLOOD LEFT HAND  Final   Special Requests   Final    BOTTLES DRAWN AEROBIC AND ANAEROBIC Blood Culture results may not be optimal due  to an inadequate volume of blood received in culture bottles   Culture   Final    NO GROWTH 2 DAYS Performed at [facility], [address], [place], [place] [address]    Report Status PENDING  Incomplete  Culture, blood (routine x 2)     Status: None (Preliminary result)   Collection Time: 10/25/18  9:52 AM  Result Value Ref Range Status   Specimen Description BLOOD RIGHT HAND  Final   Special Requests   Final    BOTTLES DRAWN AEROBIC AND ANAEROBIC Blood Culture results may not be optimal due to an inadequate volume of blood received in culture bottles   Culture   Final    NO GROWTH 2  DAYS Performed at [facility], [address], [place], [place] [address]    Report Status PENDING  Incomplete    Radiology Reports Mr Lumbar Spine Wo Contrast  Result Date: 10/25/2018 CLINICAL DATA:  Chronic low back pain. Evaluation for vertebral osteomyelitis and/or discitis in the setting of staph aureus bacteremia. Back pain for greater than 6 weeks. EXAM: MRI LUMBAR SPINE WITHOUT CONTRAST TECHNIQUE: Multiplanar, multisequence MR imaging of the lumbar spine was performed. No intravenous contrast was administered. COMPARISON:  MRI of the lumbar spine 03/01/2018. Kyphoplasty films 03/25/2018. FINDINGS: Segmentation: 5 non rib-bearing lumbar type vertebral bodies are present. The lowest fully formed vertebral body is L5. Alignment: AP alignment is anatomic. Levoconvex curvature is centered at L1. Vertebrae: Remote fractures at L4 and L3 have been treated with spinal augmentation. Asymmetric left endplate marrow changes are present at L4-5 the similar the prior study. There is no abnormal disc signal. Remote inferior endplate fracture is present at T12. Marrow signal and vertebral body heights are maintained at L1 and L2. Conus medullaris and cauda equina: Conus extends to the L1 level. Conus and cauda equina appear normal. Paraspinal and other soft tissues: Axial images are severely degraded by patient motion. No definite solid organ lesions are present. Disc levels: L1-2: Negative. L2-3: Negative. L3-4: A broad-based disc protrusion is present. Progressive moderate central and bilateral foraminal stenosis is present, right greater than left. L4-5: A broad-based disc protrusion is present. Moderate facet hypertrophy is noted. Mild left subarticular narrowing is again seen. Moderate left and mild right foraminal narrowing is similar the prior exam. L5-S1: A shallow central disc protrusion is present. Mild facet hypertrophy is noted bilaterally. IMPRESSION: 1. No evidence for discitis or osteomyelitis. 2.  Interval spinal augmentation at L4 and L3. 3. Progressive moderate central and bilateral foraminal stenosis at L3-4 is worse on the right. 4. Mild left subarticular with moderate left and mild right foraminal stenosis at L4-5 is stable. 5. Stable scoliosis. Electronically Signed   By: Marin Turnbaugh M.D.   On: 10/25/2018 13:27   Dg Chest Port 1 View  Result Date: 10/22/2018 CLINICAL DATA:  Fever EXAM: PORTABLE CHEST 1 VIEW COMPARISON:  Chest radiograph July 22, 2017 and chest CT July 27, 2018 FINDINGS: There is no edema or consolidation. Heart is slightly enlarged with pulmonary vascularity normal. There is a stent extending throughout most of the aorta, stable. No adenopathy. No bone lesions. IMPRESSION: Aortic stent graft, without appreciable demonstrable change in position. Mild cardiac enlargement. No edema or consolidation. Electronically Signed   By: Bretta Bang III M.D.   On: 10/22/2018 12:13   Ct No Charge  Result Date: 10/22/2018 CLINICAL DATA:  78 year old female with a history of left hip pain. 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 EXAM:  CT ANGIOGRAPHY CHEST, ABDOMEN AND PELVIS TECHNIQUE: Multidetector CT imaging through the chest, abdomen and pelvis was performed using the standard protocol during bolus administration of intravenous contrast. Multiplanar reconstructed images and MIPs were obtained and reviewed to evaluate the vascular anatomy. CONTRAST:  OMNIPAQUE IOHEXOL 350 MG/ML SOLN COMPARISON:  Multiple prior most recent 07/27/2018, 02/01/2018, 07/22/2017, 03/15/2017 FINDINGS: CTA CHEST FINDINGS Cardiovascular: Cardiomegaly. No pericardial fluid/thickening. Calcifications of left anterior descending, circumflex, right coronary arteries. Minimal calcifications of the aortic valve. Ascending aorta within normal limits. Redemonstration of overlapping stent grafts of the aortic arch and descending thoracic aorta for endovascular solution for acute aortic  syndrome. The proximal margin of the stent graft is unchanged. Branch vessels remain patent. The left subclavian artery appears patent at the proximal aspect, either from antegrade flow or collateral perfusion. The stent graft terminates in the thoracic aorta just above the hiatus. Diameter at the the hiatus measures 3.4 cm. Main pulmonary artery measures 4.1 cm. No filling defects, although the timing of the contrast bolus is suboptimal. Mediastinum/Nodes: No mediastinal adenopathy. Unremarkable thoracic inlet. There is debris within the mid and distal thoracic esophagus. Lungs/Pleura: Centrilobular emphysema. No pneumothorax. Linear scarring/atelectasis at the lung bases. No confluent airspace disease. Redemonstration of left lower lobe calcified granulomas. 4 mm nodule in the right upper lobe periphery on image 72 of series 7. 5 mm nodule in the right lower lobe on image 83 of series 7. No endotracheal or endobronchial debris. Musculoskeletal: No displaced fracture. Degenerative changes of the spine. No bony canal narrowing. Review of the MIP images confirms the above findings. CTA ABDOMEN AND PELVIS FINDINGS VASCULAR Aorta: Atherosclerosis of the abdominal aorta. The juxtarenal aorta measures 2.6 cm. Largest diameter of the infrarenal abdominal aorta measures 31 mm, unchanged from prior. 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 Greatest diameter at this site measures 32 mm, slightly more than the baseline study of 03/15/2017 and no significant change from the comparison CT. No inflammatory changes or fluid. Celiac: Celiac artery remains patent, compressed at the origin with a configuration most compatible with overlying diaphragmatic cruise. SMA: No significant atherosclerosis at the SMA origin. Renals: Minimal atherosclerosis of the left renal artery. Minimal atherosclerosis of the right main renal artery. Small accessory right renal artery above the main renal  artery and a small accessory renal artery below the main right renal artery. IMA: IMA remains patent, originating from the anterior aspect of the chronic dissection/irregular plaque Right lower extremity: Atherosclerotic changes of the right iliac system without occlusion or high-grade stenosis. Hypogastric artery remains patent. Common femoral artery with minimal atherosclerosis. Proximal profunda femoris and SFA patent. Left lower extremity: Atherosclerotic changes of the left iliac system without high-grade stenosis or occlusion. Hypogastric artery is patent. Common femoral artery with minimal atherosclerosis. Proximal profunda femoris and SFA patent. Veins: Unremarkable appearance of the venous system. Review of the MIP images confirms the above findings. NON-VASCULAR Hepatobiliary: Unremarkable appearance of the liver. Minimal calcified cholelithiasis within the dependent gallbladder. No inflammatory changes Pancreas: Unremarkable Spleen: Coarse calcifications of the spleen representing prior granulomatous disease Adrenals/Urinary Tract: Unremarkable left adrenal gland. 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 Right: Symmetric perfusion to the left kidney. No hydronephrosis. No nephrolithiasis. Unremarkable right ureter. Small lesion on the lateral cortex may represent hemorrhagic cyst, incompletely characterized on the current study. Left: Symmetric perfusion to the right. No hydronephrosis. Nonobstructing stone at the inferior collecting system. Cyst at the superior cortex of the left kidney. Additional  small lesions of the left kidney are too small to characterize. Unremarkable urinary bladder. Stomach/Bowel: Hiatal hernia. Unremarkable stomach. Unremarkable appearance of small bowel. No evidence of obstruction. Surgical changes of appendectomy. Colonic diverticula with no acute inflammatory changes. Lymphatic: No adenopathy. Mesenteric: No free fluid or air. No adenopathy. Reproductive: Hysterectomy  Other: Surgical clips along the midline abdomen of prior surgery. No abdominal hernia Musculoskeletal: Scoliotic curvature of the thoracolumbar spine. Changes of vertebral augmentation of L3 and L4. Degenerative changes of the thoracolumbar spine. No acute displaced fracture. Left lower extremity/hip: Mild degenerative changes of the left sacroiliac joint with no acute displaced fracture. No pelvic fracture identified. Left hip is congruent with no subluxation/dislocation. Mild joint space narrowing symmetric to the right. No femoral fracture identified. No left-sided joint effusion. Unremarkable appearance of the left hip musculature with no focal soft tissue swelling. No soft tissue lesion. No lymphadenopathy. IMPRESSION: No acute CT finding of the chest, abdomen, pelvis, or left hip. Redemonstration of endovascular repair of thoracic aorta with overlapping stent grafts of the aortic arch and descending thoracic aorta. 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 The aorta just above this plaque/dissection measures also 3.1 cm, unchanged. Aortic atherosclerosis. Aortic Atherosclerosis (ICD10-I70.0). Associated coronary artery disease. Enlarged main pulmonary artery suggesting early pulmonary hypertension. Additional ancillary findings as above. Signed, Yvone Neu. Reyne Dumas, RPVI [facility] Radiology Electronically Signed   By: Gilmer Mor D.O.   On: 10/22/2018 16:35   Ct Angio Chest/abd/pel For Dissection W And/or W/wo  Result Date: 10/22/2018 CLINICAL DATA:  78 year old female with a history of left hip pain. 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 EXAM: CT ANGIOGRAPHY CHEST, ABDOMEN AND PELVIS TECHNIQUE: Multidetector CT imaging through the chest, abdomen and pelvis was performed using the standard protocol during bolus administration of  intravenous contrast. Multiplanar reconstructed images and MIPs were obtained and reviewed to evaluate the vascular anatomy. CONTRAST:  OMNIPAQUE IOHEXOL 350 MG/ML SOLN COMPARISON:  Multiple prior most recent 07/27/2018, 02/01/2018, 07/22/2017, 03/15/2017 FINDINGS: CTA CHEST FINDINGS Cardiovascular: Cardiomegaly. No pericardial fluid/thickening. Calcifications of left anterior descending, circumflex, right coronary arteries. Minimal calcifications of the aortic valve. Ascending aorta within normal limits. Redemonstration of overlapping stent grafts of the aortic arch and descending thoracic aorta for endovascular solution for acute aortic syndrome. The proximal margin of the stent graft is unchanged. Branch vessels remain patent. The left subclavian artery appears patent at the proximal aspect, either from antegrade flow or collateral perfusion. The stent graft terminates in the thoracic aorta just above the hiatus. Diameter at the the hiatus measures 3.4 cm. Main pulmonary artery measures 4.1 cm. No filling defects, although the timing of the contrast bolus is suboptimal. Mediastinum/Nodes: No mediastinal adenopathy. Unremarkable thoracic inlet. There is debris within the mid and distal thoracic esophagus. Lungs/Pleura: Centrilobular emphysema. No pneumothorax. Linear scarring/atelectasis at the lung bases. No confluent airspace disease. Redemonstration of left lower lobe calcified granulomas. 4 mm nodule in the right upper lobe periphery on image 72 of series 7. 5 mm nodule in the right lower lobe on image 83 of series 7. No endotracheal or endobronchial debris. Musculoskeletal: No displaced fracture. Degenerative changes of the spine. No bony canal narrowing. Review of the MIP images confirms the above findings. CTA ABDOMEN AND PELVIS FINDINGS VASCULAR Aorta: Atherosclerosis of the abdominal aorta. The juxtarenal aorta measures 2.6 cm. Largest diameter of the infrarenal abdominal aorta measures 31  mm,  unchanged from prior. 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 Greatest diameter at this site measures 32 mm, slightly more than the baseline study of 03/15/2017 and no significant change from the comparison CT. No inflammatory changes or fluid. Celiac: Celiac artery remains patent, compressed at the origin with a configuration most compatible with overlying diaphragmatic cruise. SMA: No significant atherosclerosis at the SMA origin. Renals: Minimal atherosclerosis of the left renal artery. Minimal atherosclerosis of the right main renal artery. Small accessory right renal artery above the main renal artery and a small accessory renal artery below the main right renal artery. IMA: IMA remains patent, originating from the anterior aspect of the chronic dissection/irregular plaque Right lower extremity: Atherosclerotic changes of the right iliac system without occlusion or high-grade stenosis. Hypogastric artery remains patent. Common femoral artery with minimal atherosclerosis. Proximal profunda femoris and SFA patent. Left lower extremity: Atherosclerotic changes of the left iliac system without high-grade stenosis or occlusion. Hypogastric artery is patent. Common femoral artery with minimal atherosclerosis. Proximal profunda femoris and SFA patent. Veins: Unremarkable appearance of the venous system. Review of the MIP images confirms the above findings. NON-VASCULAR Hepatobiliary: Unremarkable appearance of the liver. Minimal calcified cholelithiasis within the dependent gallbladder. No inflammatory changes Pancreas: Unremarkable Spleen: Coarse calcifications of the spleen representing prior granulomatous disease Adrenals/Urinary Tract: Unremarkable left adrenal gland. 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 Right: Symmetric perfusion to the left kidney. No hydronephrosis. No nephrolithiasis. Unremarkable right ureter. Small lesion on the lateral cortex may  represent hemorrhagic cyst, incompletely characterized on the current study. Left: Symmetric perfusion to the right. No hydronephrosis. Nonobstructing stone at the inferior collecting system. Cyst at the superior cortex of the left kidney. Additional small lesions of the left kidney are too small to characterize. Unremarkable urinary bladder. Stomach/Bowel: Hiatal hernia. Unremarkable stomach. Unremarkable appearance of small bowel. No evidence of obstruction. Surgical changes of appendectomy. Colonic diverticula with no acute inflammatory changes. Lymphatic: No adenopathy. Mesenteric: No free fluid or air. No adenopathy. Reproductive: Hysterectomy Other: Surgical clips along the midline abdomen of prior surgery. No abdominal hernia Musculoskeletal: Scoliotic curvature of the thoracolumbar spine. Changes of vertebral augmentation of L3 and L4. Degenerative changes of the thoracolumbar spine. No acute displaced fracture. Left lower extremity/hip: Mild degenerative changes of the left sacroiliac joint with no acute displaced fracture. No pelvic fracture identified. Left hip is congruent with no subluxation/dislocation. Mild joint space narrowing symmetric to the right. No femoral fracture identified. No left-sided joint effusion. Unremarkable appearance of the left hip musculature with no focal soft tissue swelling. No soft tissue lesion. No lymphadenopathy. IMPRESSION: No acute CT finding of the chest, abdomen, pelvis, or left hip. Redemonstration of endovascular repair of thoracic aorta with overlapping stent grafts of the aortic arch and descending thoracic aorta. 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 The aorta just above this plaque/dissection measures also 3.1 cm, unchanged. Aortic atherosclerosis. Aortic Atherosclerosis (ICD10-I70.0). Associated coronary artery disease. Enlarged main pulmonary  artery suggesting early pulmonary hypertension. Additional ancillary findings as above. Signed, Yvone Neu. Reyne Dumas, RPVI [facility] Electronically Signed   By: Gilmer Mor D.O.   On: 10/22/2018 16:35    CBC Recent Labs  Lab 10/22/18 1128 10/23/18 0601 10/24/18 0554 10/25/18 0527 10/26/18 0603 10/27/18 0508  WBC 10.1 20.2* 11.9* 5.9 4.9 5.3  HGB 11.5* 12.3 11.5*  11.0* 11.2* 11.1*  HCT 35.2* 38.9 36.4 35.5* 35.8* 35.4*  PLT 142* 161 158 169 177 185  MCV 93.9 95.1 96.6 96.5 95.2 95.2  MCH 30.7 30.1 30.5 29.9 29.8 29.8  MCHC 32.7 31.6 31.6 31.0 31.3 31.4  RDW 15.1 14.7 15.0 14.9 14.6 14.3  LYMPHSABS 0.4*  --   --   --   --   --   MONOABS 1.2*  --   --   --   --   --   EOSABS 0.0  --   --   --   --   --   BASOSABS 0.0  --   --   --   --   --     Chemistries  Recent Labs  Lab 10/22/18 1128 10/23/18 0601 10/24/18 0554 10/25/18 0527 10/26/18 0603 10/27/18 0508  NA 137 136 137 136 136 137  K 3.1* 3.5 4.5 4.7 4.8 4.6  CL 98 97* 100 102 99 99  CO2 GLUCOSE 122* 156* 118* 97 101* 112*  BUN CREATININE 0.61 0.60 0.65 0.64 0.57 0.62  CALCIUM 8.7* 8.8* 9.4 9.1 9.0 9.3  AST 11*  --   --   --   --   --   ALT <5  --   --   --   --   --   ALKPHOS 69  --   --   --   --   --   BILITOT 1.5*  --   --   --   --   --    Cardiac Enzymes Recent Labs  Lab 10/22/18 1128  TROPONINI <0.03   ------------------------------------------------------------------------------------------------------------------    Component Value Date/Time   BNP 101.0 (H) 10/22/2018 1128    Cintya Daughety M.D on 10/27/2018 at 2:51 PM  Go to [email or web address] - for contact info  [facility] - Office  [phone]

## 2018-10-27 NOTE — NC FL2 (Signed)
[place] MEDICAID FL2 LEVEL OF CARE SCREENING TOOL     IDENTIFICATION  Patient Name: Briana CountsLou A Lindy Birthdate: 28-Nov-1940 Sex: female Admission Date (Current Location): 10/22/2018  [facility] [place]   Facility and Address:  [facility],  [address], [place] [id number]      Provider Number: [id number]  Attending Physician Name and Address:  Shon HaleEmokpae, Courage, MD  Relative Name and Phone Number:       Current Level of Care: Hospital Recommended Level of Care: Skilled Nursing Facility Prior Approval Number:    Date Approved/Denied:   PASRR Number: [id number]  Discharge Plan: SNF    Current Diagnoses: Patient Active Problem List   Diagnosis Date Noted  . Fever 10/22/2018  . Hypokalemia 10/22/2018  . Hip pain, left 10/22/2018  . Hypoxia 10/22/2018  . Congestive heart failure (HCC) 12/04/2016  . 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 Essential hypertension 12/04/2016    Orientation RESPIRATION BLADDER Height & Weight     Self  Normal Continent Weight: 174 lb 9.7 oz (79.2 kg) Height:  5\' 7"  (170.2 cm)  BEHAVIORAL SYMPTOMS/MOOD NEUROLOGICAL BOWEL NUTRITION STATUS      Continent Diet(regular)  AMBULATORY STATUS COMMUNICATION OF NEEDS Skin   Supervision Verbally Normal                       Personal Care Assistance Level of Assistance  Bathing, Feeding, Dressing Bathing Assistance: Limited assistance Feeding assistance: Independent Dressing Assistance: Limited assistance     Functional Limitations Info  Sight, Hearing, Speech Sight Info: Adequate Hearing Info: Adequate Speech Info: Adequate    SPECIAL CARE FACTORS FREQUENCY  PT (By licensed PT)     PT Frequency: 5x/week              Contractures Contractures Info: Not present    Additional Factors Info  Code Status, Allergies, Psychotropic Code Status Info: Full code Allergies Info: Aspirin, Penicillins Psychotropic Info: Paxil          Current Medications (10/27/2018):  This is the current hospital active medication list Current Facility-Administered Medications  Medication Dose Route Frequency Provider Last Rate Last Dose  . albuterol (PROVENTIL) (2.5 MG/3ML) 0.083% nebulizer solution 3 mL  3 mL Nebulization Q6H WA Levie HeritageStinson, Jacob J, DO   3 mL at 10/27/18 0809  . aspirin EC tablet 81 mg  81 mg Oral Daily Levie HeritageStinson, Jacob J, DO   81 mg at 10/27/18 1003  . atenolol (TENORMIN) tablet 25 mg  25 mg Oral BID Levie HeritageStinson, Jacob J, DO   25 mg at 10/27/18 1003  . atorvastatin (LIPITOR) tablet 40 mg  40 mg Oral Daily Levie HeritageStinson, Jacob J, DO   40 mg at 10/27/18 1003  . carbidopa-levodopa (SINEMET IR) 25-100 MG per tablet immediate release 1 tablet  1 tablet Oral Daily Levie HeritageStinson, Jacob J, DO   1 tablet at 10/27/18 1003  . ceFAZolin (ANCEF) IVPB 2g/100 mL premix  2 g Intravenous Q8H Emokpae, Courage, MD 200 mL/hr at 10/27/18 0518 2 g at 10/27/18 0518  . enoxaparin (LOVENOX) injection 40 mg  40 mg Subcutaneous Q24H Levie HeritageStinson, Jacob J, DO   40 mg at 10/26/18 2040  . famotidine (PEPCID) tablet 20 mg  20 mg Oral Daily Levie HeritageStinson, Jacob J, DO   20 mg at 10/27/18 1002  . feeding supplement (ENSURE ENLIVE) (ENSURE ENLIVE) liquid 237 mL  237 mL Oral BID BM Shah, Pratik D, DO   237 mL  at 10/27/18 1009  . furosemide (LASIX) tablet 20 mg  20 mg Oral Daily Levie Heritage, DO   20 mg at 10/27/18 1002  . HYDROcodone-acetaminophen (NORCO/VICODIN) 5-325 MG per tablet 1-2 tablet  1-2 tablet Oral Q4H PRN Sherryll Burger, Pratik D, DO   1 tablet at 10/27/18 1002  . ipratropium-albuterol (DUONEB) 0.5-2.5 (3) MG/3ML nebulizer solution 3 mL  3 mL Nebulization TID PRN Levie Heritage, DO      . lisinopril (ZESTRIL) tablet 10 mg  10 mg Oral Daily Levie Heritage, DO   10 mg at 10/27/18 1003  . LORazepam (ATIVAN) injection 0.5 mg  0.5 mg Intravenous Q6H PRN Mariea Clonts, Courage, MD   0.5 mg at 10/26/18 1753  . mometasone-formoterol (DULERA) 100-5 MCG/ACT inhaler 2 puff  2 puff Inhalation  BID Levie Heritage, DO   2 puff at 10/27/18 2025  . multivitamin with minerals tablet 1 tablet  1 tablet Oral Daily Sherryll Burger, Pratik D, DO   1 tablet at 10/27/18 1003  . Muscle Rub CREA   Topical PRN Bodenheimer, Charles A, NP      . ondansetron (ZOFRAN) tablet 4 mg  4 mg Oral Q6H PRN Levie Heritage, DO   4 mg at 10/25/18 1241   Or  . ondansetron (ZOFRAN) injection 4 mg  4 mg Intravenous Q6H PRN Levie Heritage, DO   4 mg at 10/23/18 2139  . PARoxetine (PAXIL) tablet 20 mg  20 mg Oral Daily Levie Heritage, DO   20 mg at 10/27/18 1002  . polyethylene glycol (MIRALAX / GLYCOLAX) packet 17 g  17 g Oral Daily PRN Levie Heritage, DO      . potassium chloride (K-DUR) CR tablet 30 mEq  30 mEq Oral BID Levie Heritage, DO   30 mEq at 10/27/18 1003  . rOPINIRole (REQUIP) tablet 0.5 mg  0.5 mg Oral q morning - 10a Levie Heritage, DO   0.5 mg at 10/27/18 1002  . rOPINIRole (REQUIP) tablet 1 mg  1 mg Oral QHS Levie Heritage, DO   1 mg at 10/26/18 2039  . traMADol (ULTRAM) tablet 50 mg  50 mg Oral Q6H PRN Levie Heritage, DO   50 mg at 10/27/18 4270     Discharge Medications: Please see discharge summary for a list of discharge medications.  Relevant Imaging Results:  Relevant Lab Results:   Additional Information SSN [id number]. PATIENT WILL NEED IV ANCEF FOR TWO WEEKS STARTING 10/26/2018. ANCEF 2g 256mL/hr over 30 minutes EVERY 8 HOURS  Betha Shadix D, LCSW

## 2018-10-28 DIAGNOSIS — B9561 Methicillin susceptible Staphylococcus aureus infection as the cause of diseases classified elsewhere: Secondary | ICD-10-CM | POA: Diagnosis present

## 2018-10-28 LAB — SARS CORONAVIRUS 2 BY RT PCR (HOSPITAL ORDER, PERFORMED IN ~~LOC~~ HOSPITAL LAB): SARS Coronavirus 2: NEGATIVE

## 2018-10-28 MED ORDER — POLYETHYLENE GLYCOL 3350 17 G PO PACK: 17.0000 g | PACK | Freq: Every day | ORAL | 0 refills | Status: AC

## 2018-10-28 MED ORDER — LORAZEPAM 0.5 MG PO TABS: 0.5000 mg | ORAL_TABLET | Freq: Three times a day (TID) | ORAL | 0 refills | Status: AC | PRN

## 2018-10-28 MED ORDER — ONDANSETRON HCL 4 MG PO TABS: 4.0000 mg | ORAL_TABLET | Freq: Four times a day (QID) | ORAL | 0 refills | Status: AC | PRN

## 2018-10-28 MED ORDER — MOMETASONE FURO-FORMOTEROL FUM 100-5 MCG/ACT IN AERO: 2.0000 | INHALATION_SPRAY | Freq: Two times a day (BID) | RESPIRATORY_TRACT | 1 refills | Status: AC

## 2018-10-28 MED ORDER — ALBUTEROL SULFATE (2.5 MG/3ML) 0.083% IN NEBU: 3.0000 mL | INHALATION_SOLUTION | RESPIRATORY_TRACT | 12 refills | Status: AC | PRN

## 2018-10-28 MED ORDER — POTASSIUM CHLORIDE CRYS ER 15 MEQ PO TBCR: 30.0000 meq | EXTENDED_RELEASE_TABLET | Freq: Two times a day (BID) | ORAL | 0 refills | Status: DC

## 2018-10-28 MED ORDER — ROPINIROLE HCL 1 MG PO TABS: 1.0000 mg | ORAL_TABLET | Freq: Every day | ORAL | 1 refills | Status: AC

## 2018-10-28 MED ORDER — CEFAZOLIN IV (FOR PTA / DISCHARGE USE ONLY): 2.0000 g | Freq: Three times a day (TID) | INTRAVENOUS | 0 refills | Status: AC

## 2018-10-28 MED ORDER — ROPINIROLE HCL 0.5 MG PO TABS: 0.5000 mg | ORAL_TABLET | Freq: Every morning | ORAL | 1 refills | Status: AC

## 2018-10-28 MED ORDER — ASPIRIN EC 81 MG PO TBEC: 81.0000 mg | DELAYED_RELEASE_TABLET | Freq: Every day | ORAL | 2 refills | Status: AC

## 2018-10-28 MED ORDER — ATENOLOL 25 MG PO TABS: 25.0000 mg | ORAL_TABLET | Freq: Two times a day (BID) | ORAL | 1 refills | Status: DC

## 2018-10-28 MED ORDER — ERGOCALCIFEROL 1.25 MG (50000 UT) PO CAPS: 50000.0000 [IU] | ORAL_CAPSULE | ORAL | 2 refills | Status: AC

## 2018-10-28 MED ORDER — TRAMADOL HCL 50 MG PO TABS: 50.0000 mg | ORAL_TABLET | Freq: Two times a day (BID) | ORAL | 0 refills | Status: DC | PRN

## 2018-10-28 NOTE — Discharge Summary (Addendum)
Briana Dean, is a 78 y.o. female  DOB 06-May-1941  MRN [id number].  Admission date:  10/22/2018  Admitting Physician  Rodena Goldmann, DO  Discharge Date:  10/28/2018   Primary MD  Octavio Graves, DO  Recommendations for primary care physician for things to follow:   1)Give IV Ancef for 2 weeks starting 10/26/2018  (Last dose of iv Ancef is 11/09/2018), followed by 2 weeks of p.o. Keflex 500 mg TID starting 11/10/2018 (pharmacy to adjust dose) ---Last dose of po Keflex is 11/23/2018.    patient will need repeat blood cultures 1 week after completing antibiotics on 11/30/2018 2)CBC and BMP every Monday starting 11/03/2018 in the month of June while on antibiotics  Admission Diagnosis  Hypokalemia [E87.6] Pain [R52] Hypoxia [R09.02] Left hip pain [M25.552] Hip joint painful on movement, left [M25.552] Fever, unspecified fever cause [R50.9] Sepsis, due to unspecified organism, unspecified whether acute organ dysfunction present ([place]) [A41.9] Covid-19 Virus not Detected [IMO0001]  Discharge Diagnosis  Hypokalemia [E87.6] Pain [R52] Hypoxia [R09.02] Left hip pain [M25.552] Hip joint painful on movement, left [M25.552] Fever, unspecified fever cause [R50.9] Sepsis, due to unspecified organism, unspecified whether acute organ dysfunction present ([place]) [A41.9] Covid-19 Virus not Detected [IMO0001]    Principal Problem:   Bacteremia due to methicillin susceptible Staphylococcus aureus (MSSA) Active Problems:   Congestive heart failure (HCC)   Ruptured aneurysm of thoracic aorta (HCC)   Essential hypertension   Fever   Hypokalemia   Hip pain, left      Past Medical History:  Diagnosis Date   AAA (abdominal aortic aneurysm) (HCC)    Acute on chronic respiratory failure with hypoxia (HCC)    Anemia, unspecified    Aneurysm of thoracic aorta (HCC)    Anxiety disorder    Chronic combined systolic and  diastolic heart failure (HCC)    Constipation    COPD (chronic obstructive pulmonary disease) (HCC)    Dissection of distal aorta (HCC)    Gastroesophageal reflux disease without esophagitis    Hyperlipidemia, unspecified    Hypertension, essential    Insomnia, unspecified    Low back pain    Major depressive disorder, single episode, unspecified    PAD (peripheral artery disease) (HCC)    Respiratory failure (HCC)    Restless leg syndrome    Unspecified protein-calorie malnutrition (HCC)    Weakness     Past Surgical History:  Procedure Laterality Date   IR KYPHO EA ADDL LEVEL THORACIC OR LUMBAR  03/25/2018   IR KYPHO LUMBAR INC FX REDUCE BONE BX UNI/BIL CANNULATION INC/IMAGING  03/25/2018   TEE WITHOUT CARDIOVERSION N/A 10/26/2018   Procedure: TRANSESOPHAGEAL ECHOCARDIOGRAM (TEE);  Surgeon: Arnoldo Lenis, MD;  Location: [facility];  Service: Endoscopy;  Laterality: N/A;   THORACIC AORTIC ANEURYSM REPAIR  09/06/2016   TEVAR   TRACHEOSTOMY  08/2016   VIDEO ASSISTED THORACOSCOPY (VATS)/THOROCOTOMY  08/2016       HPI  from the history and physical done on the day of admission:  Patient Coming From: home  Chief Complaint: left hip pain  HPI: Briana Dean is a 78 y.o. 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  Patient presents with left hip pain that started 1 to 2 days ago and is located in her left buttock and radiates down leg.  Pain is 10 out of 10.  Pain is same location as her chronic hip and leg pain, but much more severe.  No recent falls, injuries.  Patient states that movement appears to improve her pain, as well as massage.  No provoking factors.  On arrival, the patient was noted to have a fever and hypoxia.  The patient was unaware of her fever, although  admits to feeling cold.  Upon questioning, she does admit to some shortness of breath with exertion but is generally okay with rest.  Occasional intermittent cough that is nonproductive.  She denies abdominal pain, nausea, vomiting, diarrhea.  Emergency Department Course: Sepsis work-up started with blood cultures, urine cultures.  Broad-spectrum antibiotics of vancomycin, cefepime, Flagyl were started.  White count 10.  Lactic acid normal.  Potassium 3.1     Hospital Course:     Brief Summary:- 78 yo F with prev thoracic aortic aneurysm repair as well as lumbar kyphoplasty admitted 10/22/18 with L buttock pain, MSSA bacteremia as well as fever/hypoxia/confusion.  Her studies for spine and previous aortic repair do not show evidence of disseminated infection. Give IV Ancef for 2 weeks starting 10/26/2018  (Last dose of iv Ancef is 11/09/2018), followed by 2 weeks of p.o. Keflex 500 mg TID starting 11/10/2018 (pharmacy to adjust dose) ---Last dose of po Keflex is 11/23/2018.    patient will need repeat blood cultures 1 week after completing antibiotics on 11/30/2018 2)CBC and BMP every Monday starting 11/03/2018 in the month of June while on antibiotics   .   A/p 1)MSSA Bacteremia--- repeat blood cultures from 10/25/2018 NGTD, TEE negative on 10/26/2018,  infectious disease consult/input from Dr. Bobby Rumpf appreciated... Recommends IV Ancef for 2 weeks starting 10/26/2018 followed by 2 weeks of p.o. Keflex if repeat blood cultures from 10/25/18 remain negative, This is a prolonged course given her aortic repair. patient will need repeat blood cultures 1 week after completing antibiotics. MRSA screen is Negative. ---- -----Give IV Ancef for 2 weeks starting 10/26/2018  (Last dose of iv Ancef is 11/09/2018), followed by 2 weeks of p.o. Keflex 500 mg TID starting 11/10/2018 (pharmacy to adjust dose) ---Last dose of po Keflex is 11/23/2018.    patient will need repeat blood cultures 1 week after  completing antibiotics on 11/30/2018 CBC and BMP every Monday starting 11/03/2018 in the month of June while on antibiotics   2)HTN--- stable, c/n Atenolol 25 mg po bid, continue lisinopril 10 mg daily  3)HFpEF--- stable, last known EF is 60 to 65 %, c/n Lisinopril 10 mg daily, Lasix 20 mg daily,   4)HLD--- c/n Lipitor 40 mg and aspirin,   5)COPD-stable, c/n Dulera, change albuterol nebs to as needed  6)Dementia with behavioral disturbance--- lorazepam as needed agitation and restlessness, continue Paxil 20 mg daily  7) movement disorder--- stable, continue Sinemet and Requip  Code Status : FULL  Family Communication:   Na   Disposition Plan  : SNF for physical therapy rehab bed to complete IV antibiotics  Consults  :  Cardiology/Infectious Disease  Discharge Condition:  stable  Diet and Activity recommendation:  As advised  Discharge Instructions     Discharge Instructions    Call MD for:  difficulty breathing, headache or visual disturbances   Complete by:  As directed    Call MD for:  persistant dizziness or light-headedness   Complete by:  As directed    Call MD for:  persistant nausea and vomiting   Complete by:  As directed    Call MD for:  severe uncontrolled pain   Complete by:  As directed    Call MD for:  temperature >100.4   Complete by:  As directed    Diet - low sodium heart healthy   Complete by:  As directed    Discharge instructions   Complete by:  As directed    1)Give IV Ancef for 2 weeks starting 10/26/2018  (Last dose of iv Ancef is 11/09/2018), followed by 2 weeks of p.o. Keflex 500 mg TID starting 11/10/2018 (pharmacy to adjust dose) ---Last dose of po Keflex is 11/23/2018.    patient will need repeat blood cultures 1 week after completing antibiotics on 11/30/2018 2)CBC and BMP every Monday starting 11/03/2018 in the month of June while on antibiotics   Home infusion instructions [place] May follow [place] Dosing Protocol; May  administer Cathflo as needed to maintain patency of vascular access device.; Flushing of vascular access device: per [facility] Protocol: 0.9% NaCl pre/post medica...   Complete by:  As directed    Instructions:  May follow [place] Dosing Protocol   Instructions:  May administer Cathflo as needed to maintain patency of vascular access device.   Instructions:  Flushing of vascular access device: per [facility] Protocol: 0.9% NaCl pre/post medication administration and prn patency; Heparin 100 u/ml, 77m for implanted ports and Heparin 10u/ml, 544mfor all other central venous catheters.   Instructions:  May follow [facility] Anaphylaxis Protocol for First Dose Administration in the home: 0.9% NaCl at 25-50 ml/hr to maintain IV access for protocol meds. Epinephrine 0.3 ml IV/IM PRN and Benadryl 25-50 IV/IM PRN s/s of anaphylaxis.   Instructions:  [facility] Coordinator (RN) to assist per patient IV care needs in the home PRN.   Increase activity slowly   Complete by:  As directed         Discharge Medications     Allergies as of 10/28/2018      Reactions   Aspirin Other (See Comments)   Stomach pain   Penicillins    Has patient had a PCN reaction causing immediate rash, facial/tongue/throat swelling, SOB or lightheadedness with hypotension No Has patient had a PCN reaction causing severe rash involving mucus membranes or skin necrosis: No Has patient had a PCN reaction that required hospitalization: No Has patient had a PCN reaction occurring within the last 10 years: No If all of the above answers are "NO", then may proceed with Cephalosporin use.      Medication List    STOP taking these medications   DUONEB IN     TAKE these medications   albuterol (2.5 MG/3ML) 0.083% nebulizer solution Commonly known as:  PROVENTIL Take 3 mLs by nebulization every 4 (four) hours as needed for wheezing or shortness of breath.   aspirin EC 81 MG tablet Take 1 tablet (81 mg total) by mouth daily  with breakfast. What changed:  when to take this   atenolol 25 MG tablet Commonly known as:  TENORMIN Take 1 tablet (25 mg total)  by mouth 2 (two) times daily.   atorvastatin 40 MG tablet Commonly known as:  LIPITOR Take 40 mg by mouth daily.   carbidopa-levodopa 25-100 MG tablet Commonly known as:  SINEMET IR Take 1 tablet by mouth daily.   ceFAZolin  IVPB Commonly known as:  ANCEF Inject 2 g into the vein every 8 (eight) hours for 12 days. Indication:  MSSA Bacteremia Last Day of Therapy:  11/09/2018 Labs - Once weekly:  CBC/D and BMP, Labs - Every other week:  ESR and CRP   ergocalciferol 1.25 MG (50000 UT) capsule Commonly known as:  VITAMIN D2 Take 1 capsule (50,000 Units total) by mouth every Friday. What changed:  when to take this   famotidine 20 MG tablet Commonly known as:  PEPCID Take 20 mg by mouth daily.   ferrous sulfate 324 (65 Fe) MG Tbec Take by mouth daily.   furosemide 20 MG tablet Commonly known as:  LASIX Take 20 mg by mouth daily.   lisinopril 10 MG tablet Commonly known as:  ZESTRIL TAKE 1 TABLET BY MOUTH ONCE DAILY   LORazepam 0.5 MG tablet Commonly known as:  Ativan Take 1 tablet (0.5 mg total) by mouth every 8 (eight) hours as needed for anxiety or sedation.   mometasone-formoterol 100-5 MCG/ACT Aero Commonly known as:  DULERA Inhale 2 puffs into the lungs 2 (two) times daily.   ondansetron 4 MG tablet Commonly known as:  ZOFRAN Take 1 tablet (4 mg total) by mouth every 6 (six) hours as needed for nausea.   PARoxetine 20 MG tablet Commonly known as:  PAXIL Take 20 mg by mouth daily.   polyethylene glycol 17 g packet Commonly known as:  MIRALAX / GLYCOLAX Take 17 g by mouth daily. What changed:    when to take this  reasons to take this   potassium chloride SA 15 MEQ tablet Commonly known as:  KLOR-CON M15 Take 2 tablets (30 mEq total) by mouth 2 (two) times daily for 10 days.   RA MELATONIN/B-6 PO Take 1 tablet by mouth  at bedtime.   rOPINIRole 1 MG tablet Commonly known as:  REQUIP Take 1 tablet (1 mg total) by mouth at bedtime. What changed:  You were already taking a medication with the same name, and this prescription was added. Make sure you understand how and when to take each.   rOPINIRole 0.5 MG tablet Commonly known as:  REQUIP Take 1 tablet (0.5 mg total) by mouth every morning. Start taking on:  Oct 29, 2018 What changed:    how much to take  when to take this  additional instructions   traMADol 50 MG tablet Commonly known as:  ULTRAM Take 1 tablet (50 mg total) by mouth every 12 (twelve) hours as needed for moderate pain.            Home Infusion Instuctions  (From admission, onward)         Start     Ordered   10/28/18 0000  Home infusion instructions [facility] May follow [place] Dosing Protocol; May administer Cathflo as needed to maintain patency of vascular access device.; Flushing of vascular access device: per [facility] Protocol: 0.9% NaCl pre/post medica...    Question Answer Comment  Instructions May follow [place] Dosing Protocol   Instructions May administer Cathflo as needed to maintain patency of vascular access device.   Instructions Flushing of vascular access device: per [facility] Protocol: 0.9% NaCl pre/post medication administration and prn patency;  Heparin 100 u/ml, 66m for implanted ports and Heparin 10u/ml, 597mfor all other central venous catheters.   Instructions May follow [facility] Anaphylaxis Protocol for First Dose Administration in the home: 0.9% NaCl at 25-50 ml/hr to maintain IV access for protocol meds. Epinephrine 0.3 ml IV/IM PRN and Benadryl 25-50 IV/IM PRN s/s of anaphylaxis.   Instructions [facility] Infusion Coordinator (RN) to assist per patient IV care needs in the home PRN.      10/28/18 1516          Major procedures and Radiology Reports - PLEASE review detailed and final reports for all details, in brief -   Mr Lumbar  Spine Wo Contrast  Result Date: 10/25/2018 CLINICAL DATA:  Chronic low back pain. Evaluation for vertebral osteomyelitis and/or discitis in the setting of staph aureus bacteremia. Back pain for greater than 6 weeks. EXAM: MRI LUMBAR SPINE WITHOUT CONTRAST TECHNIQUE: Multiplanar, multisequence MR imaging of the lumbar spine was performed. No intravenous contrast was administered. COMPARISON:  MRI of the lumbar spine 03/01/2018. Kyphoplasty films 03/25/2018. FINDINGS: Segmentation: 5 non rib-bearing lumbar type vertebral bodies are present. The lowest fully formed vertebral body is L5. Alignment: AP alignment is anatomic. Levoconvex curvature is centered at L1. Vertebrae: Remote fractures at L4 and L3 have been treated with spinal augmentation. Asymmetric left endplate marrow changes are present at L4-5 the similar the prior study. There is no abnormal disc signal. Remote inferior endplate fracture is present at T12. Marrow signal and vertebral body heights are maintained at L1 and L2. Conus medullaris and cauda equina: Conus extends to the L1 level. Conus and cauda equina appear normal. Paraspinal and other soft tissues: Axial images are severely degraded by patient motion. No definite solid organ lesions are present. Disc levels: L1-2: Negative. L2-3: Negative. L3-4: A broad-based disc protrusion is present. Progressive moderate central and bilateral foraminal stenosis is present, right greater than left. L4-5: A broad-based disc protrusion is present. Moderate facet hypertrophy is noted. Mild left subarticular narrowing is again seen. Moderate left and mild right foraminal narrowing is similar the prior exam. L5-S1: A shallow central disc protrusion is present. Mild facet hypertrophy is noted bilaterally. IMPRESSION: 1. No evidence for discitis or osteomyelitis. 2. Interval spinal augmentation at L4 and L3. 3. Progressive moderate central and bilateral foraminal stenosis at L3-4 is worse on the right. 4. Mild  left subarticular with moderate left and mild right foraminal stenosis at L4-5 is stable. 5. Stable scoliosis. Electronically Signed   By: ChSan Morelle.D.   On: 10/25/2018 13:27   Dg Chest Port 1 View  Result Date: 10/22/2018 CLINICAL DATA:  Fever EXAM: PORTABLE CHEST 1 VIEW COMPARISON:  Chest radiograph July 22, 2017 and chest CT July 27, 2018 FINDINGS: There is no edema or consolidation. Heart is slightly enlarged with pulmonary vascularity normal. There is a stent extending throughout most of the aorta, stable. No adenopathy. No bone lesions. IMPRESSION: Aortic stent graft, without appreciable demonstrable change in position. Mild cardiac enlargement. No edema or consolidation. Electronically Signed   By: WiLowella GripII M.D.   On: 10/22/2018 12:13   Ct No Charge  Result Date: 10/22/2018 CLINICAL DATA:  [age]ear old female with a history of left hip pain. 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 EXAM: CT ANGIOGRAPHY CHEST, ABDOMEN AND PELVIS TECHNIQUE: Multidetector CT imaging through the chest, abdomen and pelvis was performed using the standard protocol during bolus administration of intravenous contrast. Multiplanar reconstructed images and MIPs were obtained and  reviewed to evaluate the vascular anatomy. CONTRAST:  132m OMNIPAQUE IOHEXOL 350 MG/ML SOLN COMPARISON:  Multiple prior most recent 07/27/2018, 02/01/2018, 07/22/2017, 03/15/2017 FINDINGS: CTA CHEST FINDINGS Cardiovascular: Cardiomegaly. No pericardial fluid/thickening. Calcifications of left anterior descending, circumflex, right coronary arteries. Minimal calcifications of the aortic valve. Ascending aorta within normal limits. Redemonstration of overlapping stent grafts of the aortic arch and descending thoracic aorta for endovascular solution for acute aortic syndrome. The proximal margin of the stent graft is unchanged. Branch vessels remain patent. The left subclavian artery appears patent at the  proximal aspect, either from antegrade flow or collateral perfusion. The stent graft terminates in the thoracic aorta just above the hiatus. Diameter at the the hiatus measures 3.4 cm. Main pulmonary artery measures 4.1 cm. No filling defects, although the timing of the contrast bolus is suboptimal. Mediastinum/Nodes: No mediastinal adenopathy. Unremarkable thoracic inlet. There is debris within the mid and distal thoracic esophagus. Lungs/Pleura: Centrilobular emphysema. No pneumothorax. Linear scarring/atelectasis at the lung bases. No confluent airspace disease. Redemonstration of left lower lobe calcified granulomas. 4 mm nodule in the right upper lobe periphery on image 72 of series 7. 5 mm nodule in the right lower lobe on image 83 of series 7. No endotracheal or endobronchial debris. Musculoskeletal: No displaced fracture. Degenerative changes of the spine. No bony canal narrowing. Review of the MIP images confirms the above findings. CTA ABDOMEN AND PELVIS FINDINGS VASCULAR Aorta: Atherosclerosis of the abdominal aorta. The juxtarenal aorta measures 2.6 cm. Largest diameter of the infrarenal abdominal aorta measures 31 mm, unchanged from prior. 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 Greatest diameter at this site measures 32 mm, slightly more than the baseline study of 03/15/2017 and no significant change from the comparison CT. No inflammatory changes or fluid. Celiac: Celiac artery remains patent, compressed at the origin with a configuration most compatible with overlying diaphragmatic cruise. SMA: No significant atherosclerosis at the SMA origin. Renals: Minimal atherosclerosis of the left renal artery. Minimal atherosclerosis of the right main renal artery. Small accessory right renal artery above the main renal artery and a small accessory renal artery below the main right renal artery. IMA: IMA remains patent, originating from the anterior aspect of  the chronic dissection/irregular plaque Right lower extremity: Atherosclerotic changes of the right iliac system without occlusion or high-grade stenosis. Hypogastric artery remains patent. Common femoral artery with minimal atherosclerosis. Proximal profunda femoris and SFA patent. Left lower extremity: Atherosclerotic changes of the left iliac system without high-grade stenosis or occlusion. Hypogastric artery is patent. Common femoral artery with minimal atherosclerosis. Proximal profunda femoris and SFA patent. Veins: Unremarkable appearance of the venous system. Review of the MIP images confirms the above findings. NON-VASCULAR Hepatobiliary: Unremarkable appearance of the liver. Minimal calcified cholelithiasis within the dependent gallbladder. No inflammatory changes Pancreas: Unremarkable Spleen: Coarse calcifications of the spleen representing prior granulomatous disease Adrenals/Urinary Tract: Unremarkable left adrenal gland. 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 Right: Symmetric perfusion to the left kidney. No hydronephrosis. No nephrolithiasis. Unremarkable right ureter. Small lesion on the lateral cortex may represent hemorrhagic cyst, incompletely characterized on the current study. Left: Symmetric perfusion to the right. No hydronephrosis. Nonobstructing stone at the inferior collecting system. Cyst at the superior cortex of the left kidney. Additional small lesions of the left kidney are too small to characterize. Unremarkable urinary bladder. Stomach/Bowel: Hiatal hernia. Unremarkable stomach. Unremarkable appearance of small bowel. No evidence of obstruction. Surgical changes of appendectomy. Colonic diverticula with no  acute inflammatory changes. Lymphatic: No adenopathy. Mesenteric: No free fluid or air. No adenopathy. Reproductive: Hysterectomy Other: Surgical clips along the midline abdomen of prior surgery. No abdominal hernia Musculoskeletal: Scoliotic curvature of the thoracolumbar  spine. Changes of vertebral augmentation of L3 and L4. Degenerative changes of the thoracolumbar spine. No acute displaced fracture. Left lower extremity/hip: Mild degenerative changes of the left sacroiliac joint with no acute displaced fracture. No pelvic fracture identified. Left hip is congruent with no subluxation/dislocation. Mild joint space narrowing symmetric to the right. No femoral fracture identified. No left-sided joint effusion. Unremarkable appearance of the left hip musculature with no focal soft tissue swelling. No soft tissue lesion. No lymphadenopathy. IMPRESSION: No acute CT finding of the chest, abdomen, pelvis, or left hip. Redemonstration of endovascular repair of thoracic aorta with overlapping stent grafts of the aortic arch and descending thoracic aorta. 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 The aorta just above this plaque/dissection measures also 3.1 cm, unchanged. Aortic atherosclerosis. Aortic Atherosclerosis (ICD10-I70.0). Associated coronary artery disease. Enlarged main pulmonary artery suggesting early pulmonary hypertension. Additional ancillary findings as above. Signed, Dulcy Fanny. Dellia Nims, RPVI [facility] Radiology Electronically Signed   By: Corrie Mckusick D.O.   On: 10/22/2018 16:35   Ct Angio Chest/abd/pel For Dissection W And/or W/wo  Result Date: 10/22/2018 CLINICAL DATA:  78 year old female with a history of left hip pain. 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 EXAM: CT ANGIOGRAPHY CHEST, ABDOMEN AND PELVIS TECHNIQUE: Multidetector CT imaging through the chest, abdomen and pelvis was performed using the standard protocol during bolus administration of intravenous contrast. Multiplanar reconstructed images and MIPs were obtained and reviewed to evaluate the vascular anatomy. CONTRAST:  167m  OMNIPAQUE IOHEXOL 350 MG/ML SOLN COMPARISON:  Multiple prior most recent 07/27/2018, 02/01/2018, 07/22/2017, 03/15/2017 FINDINGS: CTA CHEST FINDINGS Cardiovascular: Cardiomegaly. No pericardial fluid/thickening. Calcifications of left anterior descending, circumflex, right coronary arteries. Minimal calcifications of the aortic valve. Ascending aorta within normal limits. Redemonstration of overlapping stent grafts of the aortic arch and descending thoracic aorta for endovascular solution for acute aortic syndrome. The proximal margin of the stent graft is unchanged. Branch vessels remain patent. The left subclavian artery appears patent at the proximal aspect, either from antegrade flow or collateral perfusion. The stent graft terminates in the thoracic aorta just above the hiatus. Diameter at the the hiatus measures 3.4 cm. Main pulmonary artery measures 4.1 cm. No filling defects, although the timing of the contrast bolus is suboptimal. Mediastinum/Nodes: No mediastinal adenopathy. Unremarkable thoracic inlet. There is debris within the mid and distal thoracic esophagus. Lungs/Pleura: Centrilobular emphysema. No pneumothorax. Linear scarring/atelectasis at the lung bases. No confluent airspace disease. Redemonstration of left lower lobe calcified granulomas. 4 mm nodule in the right upper lobe periphery on image 72 of series 7. 5 mm nodule in the right lower lobe on image 83 of series 7. No endotracheal or endobronchial debris. Musculoskeletal: No displaced fracture. Degenerative changes of the spine. No bony canal narrowing. Review of the MIP images confirms the above findings. CTA ABDOMEN AND PELVIS FINDINGS VASCULAR Aorta: Atherosclerosis of the abdominal aorta. The juxtarenal aorta measures 2.6 cm. Largest diameter of the infrarenal abdominal aorta measures 31 mm, unchanged from prior. 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 Greatest diameter at this site  measures 32 mm, slightly more than the baseline study of 03/15/2017 and no  significant change from the comparison CT. No inflammatory changes or fluid. Celiac: Celiac artery remains patent, compressed at the origin with a configuration most compatible with overlying diaphragmatic cruise. SMA: No significant atherosclerosis at the SMA origin. Renals: Minimal atherosclerosis of the left renal artery. Minimal atherosclerosis of the right main renal artery. Small accessory right renal artery above the main renal artery and a small accessory renal artery below the main right renal artery. IMA: IMA remains patent, originating from the anterior aspect of the chronic dissection/irregular plaque Right lower extremity: Atherosclerotic changes of the right iliac system without occlusion or high-grade stenosis. Hypogastric artery remains patent. Common femoral artery with minimal atherosclerosis. Proximal profunda femoris and SFA patent. Left lower extremity: Atherosclerotic changes of the left iliac system without high-grade stenosis or occlusion. Hypogastric artery is patent. Common femoral artery with minimal atherosclerosis. Proximal profunda femoris and SFA patent. Veins: Unremarkable appearance of the venous system. Review of the MIP images confirms the above findings. NON-VASCULAR Hepatobiliary: Unremarkable appearance of the liver. Minimal calcified cholelithiasis within the dependent gallbladder. No inflammatory changes Pancreas: Unremarkable Spleen: Coarse calcifications of the spleen representing prior granulomatous disease Adrenals/Urinary Tract: Unremarkable left adrenal gland. 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 Right: Symmetric perfusion to the left kidney. No hydronephrosis. No nephrolithiasis. Unremarkable right ureter. Small lesion on the lateral cortex may represent hemorrhagic cyst, incompletely characterized on the current study. Left: Symmetric perfusion to the right. No hydronephrosis.  Nonobstructing stone at the inferior collecting system. Cyst at the superior cortex of the left kidney. Additional small lesions of the left kidney are too small to characterize. Unremarkable urinary bladder. Stomach/Bowel: Hiatal hernia. Unremarkable stomach. Unremarkable appearance of small bowel. No evidence of obstruction. Surgical changes of appendectomy. Colonic diverticula with no acute inflammatory changes. Lymphatic: No adenopathy. Mesenteric: No free fluid or air. No adenopathy. Reproductive: Hysterectomy Other: Surgical clips along the midline abdomen of prior surgery. No abdominal hernia Musculoskeletal: Scoliotic curvature of the thoracolumbar spine. Changes of vertebral augmentation of L3 and L4. Degenerative changes of the thoracolumbar spine. No acute displaced fracture. Left lower extremity/hip: Mild degenerative changes of the left sacroiliac joint with no acute displaced fracture. No pelvic fracture identified. Left hip is congruent with no subluxation/dislocation. Mild joint space narrowing symmetric to the right. No femoral fracture identified. No left-sided joint effusion. Unremarkable appearance of the left hip musculature with no focal soft tissue swelling. No soft tissue lesion. No lymphadenopathy. IMPRESSION: No acute CT finding of the chest, abdomen, pelvis, or left hip. Redemonstration of endovascular repair of thoracic aorta with overlapping stent grafts of the aortic arch and descending thoracic aorta. 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 The aorta just above this plaque/dissection measures also 3.1 cm, unchanged. Aortic atherosclerosis. Aortic Atherosclerosis (ICD10-I70.0). Associated coronary artery disease. Enlarged main pulmonary artery suggesting early pulmonary hypertension. Additional ancillary findings as above. Signed, Dulcy Fanny. Dellia Nims, RPVI [facility] Radiology Electronically Signed   By: Corrie Mckusick D.O.   On: 10/22/2018 16:35    Micro Results    Recent Results (from the past 240 hour(s))  Blood Culture (routine x 2)     Status: Abnormal   Collection Time: 10/22/18 11:29 AM  Result Value Ref Range Status   Specimen Description   Final    BLOOD LEFT ANTECUBITAL Performed at [facility], [address] [place], [place] [address]    Special Requests  Final    BOTTLES DRAWN AEROBIC AND ANAEROBIC Blood Culture adequate volume Performed at [facility], [address] [place], [place] [address]    Culture  Setup Time   Final    GRAM POSITIVE COCCI Gram Stain Report Called to,Read Back By and Verified With: JOHNSON,B @ 0203 ON 10/23/18 BY JUW GS DONE @ [facility] Performed at [facility], [address], [place], [place] [address]    Culture (A)  Final    STAPHYLOCOCCUS AUREUS SUSCEPTIBILITIES PERFORMED ON PREVIOUS CULTURE WITHIN THE LAST 5 DAYS. Performed at [facility], [place] [address] [place], [place] [address]    Report Status 10/25/2018 FINAL  Final  Blood Culture (routine x 2)     Status: Abnormal   Collection Time: 10/22/18 11:29 AM  Result Value Ref Range Status   Specimen Description   Final    BLOOD BLOOD RIGHT WRIST Performed at [facility], [address] [place], [place] [address]    Special Requests   Final    BOTTLES DRAWN AEROBIC AND ANAEROBIC Blood Culture adequate volume Performed at [facility], [address], [place], [place] [address]    Culture  Setup Time   Final    GRAM POSITIVE COCCI Gram Stain Report Called to,Read Back By and Verified With: GRAVES,K @ [id number] ON 10/23/18 BY JUW GS DONE @ [facility] Performed at [facility], [address], [place],  [address]    Culture STAPHYLOCOCCUS AUREUS (A)  Final   Report Status 10/25/2018 FINAL  Final   Organism ID, Bacteria STAPHYLOCOCCUS AUREUS  Final      Susceptibility   Staphylococcus aureus - MIC*      CIPROFLOXACIN <=0.5 SENSITIVE Sensitive     ERYTHROMYCIN <=0.25 SENSITIVE Sensitive     GENTAMICIN <=0.5 SENSITIVE Sensitive     OXACILLIN 0.5 SENSITIVE Sensitive     TETRACYCLINE <=1 SENSITIVE Sensitive     VANCOMYCIN <=0.5 SENSITIVE Sensitive     TRIMETH/SULFA <=10 SENSITIVE Sensitive     CLINDAMYCIN <=0.25 SENSITIVE Sensitive     RIFAMPIN <=0.5 SENSITIVE Sensitive     Inducible Clindamycin NEGATIVE Sensitive     * STAPHYLOCOCCUS AUREUS  SARS Coronavirus 2 (CEPHEID- Performed in [place] hospital lab), Hosp Order     Status: None   Collection Time: 10/22/18 12:37 PM  Result Value Ref Range Status   SARS Coronavirus 2 NEGATIVE NEGATIVE Final    Comment: (NOTE) If result is NEGATIVE SARS-CoV-2 target nucleic acids are NOT DETECTED. The SARS-CoV-2 RNA is generally detectable in upper and lower  respiratory specimens during the acute phase of infection. The lowest  concentration of SARS-CoV-2 viral copies this assay can detect is 250  copies / mL. A negative result does not preclude SARS-CoV-2 infection  and should not be used as the sole basis for treatment or other  patient management decisions.  A negative result may occur with  improper specimen collection / handling, submission of specimen other  than nasopharyngeal swab, presence of viral mutation(s) within the  areas targeted by this assay, and inadequate number of viral copies  (<250 copies / mL). A negative result must be combined with clinical  observations, patient history, and epidemiological information. If result is POSITIVE SARS-CoV-2 target nucleic acids are DETECTED. The SARS-CoV-2 RNA is generally detectable in upper and lower  respiratory specimens dur ing the acute phase of infection.  Positive  results are indicative of active infection with SARS-CoV-2.  Clinical  correlation with patient history and other diagnostic  information is  necessary to determine patient infection status.  Positive results do   not rule out bacterial infection or co-infection with other viruses. If result is PRESUMPTIVE POSTIVE SARS-CoV-2 nucleic acids MAY BE PRESENT.   A presumptive positive result was obtained on the submitted specimen  and confirmed on repeat testing.  While 2019 novel coronavirus  (SARS-CoV-2) nucleic acids may be present in the submitted sample  additional confirmatory testing may be necessary for epidemiological  and / or clinical management purposes  to differentiate between  SARS-CoV-2 and other Sarbecovirus currently known to infect humans.  If clinically indicated additional testing with an alternate test  methodology [id number]) is advised. The SARS-CoV-2 RNA is generally  detectable in upper and lower respiratory sp ecimens during the acute  phase of infection. The expected result is Negative. Fact Sheet for Patients:  StrictlyIdeas.no Fact Sheet for Healthcare Providers: BankingDealers.co.za This test is not yet approved or cleared by the [place] FDA and has been authorized for detection and/or diagnosis of SARS-CoV-2 by FDA under an Emergency Use Authorization (EUA).  This EUA will remain in effect (meaning this test can be used) for the duration of the COVID-19 declaration under Section 564(b)(1) of the Act, 21 U.S.C. section 360bbb-3(b)(1), unless the authorization is terminated or revoked sooner. Performed at [facility], [address] [place], [place] [address]   Respiratory Panel by PCR     Status: None   Collection Time: 10/22/18 11:30 PM  Result Value Ref Range Status   Adenovirus NOT DETECTED NOT DETECTED Final   Coronavirus 229E NOT DETECTED NOT DETECTED Final    Comment: (NOTE) The Coronavirus on the Respiratory Panel, DOES NOT test for the novel  Coronavirus (2019 nCoV)    Coronavirus HKU1 NOT DETECTED NOT DETECTED Final   Coronavirus NL63 NOT DETECTED NOT DETECTED Final   Coronavirus OC43 NOT DETECTED NOT  DETECTED Final   Metapneumovirus NOT DETECTED NOT DETECTED Final   Rhinovirus / Enterovirus NOT DETECTED NOT DETECTED Final   Influenza A NOT DETECTED NOT DETECTED Final   Influenza B NOT DETECTED NOT DETECTED Final   Parainfluenza Virus 1 NOT DETECTED NOT DETECTED Final   Parainfluenza Virus 2 NOT DETECTED NOT DETECTED Final   Parainfluenza Virus 3 NOT DETECTED NOT DETECTED Final   Parainfluenza Virus 4 NOT DETECTED NOT DETECTED Final   Respiratory Syncytial Virus NOT DETECTED NOT DETECTED Final   Bordetella pertussis NOT DETECTED NOT DETECTED Final   Chlamydophila pneumoniae NOT DETECTED NOT DETECTED Final   Mycoplasma pneumoniae NOT DETECTED NOT DETECTED Final    Comment: Performed at [facility], [place] [address] [place], [place] [address]  MRSA PCR Screening     Status: None   Collection Time: 10/23/18  9:44 AM  Result Value Ref Range Status   MRSA by PCR NEGATIVE NEGATIVE Final    Comment:        The GeneXpert MRSA Assay (FDA approved for NASAL specimens only), is one component of a comprehensive MRSA colonization surveillance program. It is not intended to diagnose MRSA infection nor to guide or monitor treatment for MRSA infections. Performed at [facility], [address], [place], [place] [address]   Culture, blood (routine x 2)     Status: None (Preliminary result)   Collection Time: 10/25/18  9:52 AM  Result Value Ref Range Status   Specimen Description BLOOD LEFT HAND  Final   Special Requests   Final    BOTTLES DRAWN AEROBIC AND ANAEROBIC Blood  Culture results may not be optimal due to an inadequate volume of blood received in culture bottles   Culture   Final    NO GROWTH 3 DAYS Performed at [facility], [address], [place], [place] [address]    Report Status PENDING  Incomplete  Culture, blood (routine x 2)     Status: None (Preliminary result)   Collection Time: 10/25/18  9:52 AM  Result Value Ref Range Status   Specimen Description BLOOD RIGHT  HAND  Final   Special Requests   Final    BOTTLES DRAWN AEROBIC AND ANAEROBIC Blood Culture results may not be optimal due to an inadequate volume of blood received in culture bottles   Culture   Final    NO GROWTH 3 DAYS Performed at [facility], [address] [place], [place] [address]    Report Status PENDING  Incomplete  SARS Coronavirus 2 (CEPHEID - Performed in [place] hospital lab), Hosp Order     Status: None   Collection Time: 10/28/18 10:20 AM  Result Value Ref Range Status   SARS Coronavirus 2 NEGATIVE NEGATIVE Final    Comment: (NOTE) If result is NEGATIVE SARS-CoV-2 target nucleic acids are NOT DETECTED. The SARS-CoV-2 RNA is generally detectable in upper and lower  respiratory specimens during the acute phase of infection. The lowest  concentration of SARS-CoV-2 viral copies this assay can detect is 250  copies / mL. A negative result does not preclude SARS-CoV-2 infection  and should not be used as the sole basis for treatment or other  patient management decisions.  A negative result may occur with  improper specimen collection / handling, submission of specimen other  than nasopharyngeal swab, presence of viral mutation(s) within the  areas targeted by this assay, and inadequate number of viral copies  (<250 copies / mL). A negative result must be combined with clinical  observations, patient history, and epidemiological information. If result is POSITIVE SARS-CoV-2 target nucleic acids are DETECTED. The SARS-CoV-2 RNA is generally detectable in upper and lower  respiratory specimens dur ing the acute phase of infection.  Positive  results are indicative of active infection with SARS-CoV-2.  Clinical  correlation with patient history and other diagnostic information is  necessary to determine patient infection status.  Positive results do  not rule out bacterial infection or co-infection with other viruses. If result is PRESUMPTIVE POSTIVE SARS-CoV-2 nucleic  acids MAY BE PRESENT.   A presumptive positive result was obtained on the submitted specimen  and confirmed on repeat testing.  While 2019 novel coronavirus  (SARS-CoV-2) nucleic acids may be present in the submitted sample  additional confirmatory testing may be necessary for epidemiological  and / or clinical management purposes  to differentiate between  SARS-CoV-2 and other Sarbecovirus currently known to infect humans.  If clinically indicated additional testing with an alternate test  methodology [phone]) is advised. The SARS-CoV-2 RNA is generally  detectable in upper and lower respiratory sp ecimens during the acute  phase of infection. The expected result is Negative. Fact Sheet for Patients:  StrictlyIdeas.no Fact Sheet for Healthcare Providers: BankingDealers.co.za This test is not yet approved or cleared by the [place] FDA and has been authorized for detection and/or diagnosis of SARS-CoV-2 by FDA under an Emergency Use Authorization (EUA).  This EUA will remain in effect (meaning this test can be used) for the duration of the COVID-19 declaration under Section 564(b)(1) of the Act, 21 U.S.C. section 360bbb-3(b)(1), unless the authorization is  terminated or revoked sooner. Performed at [facility], [address], [place], [place] [address]        Today   Subjective    Giana Castner today has no new complaints, eating and drinking okay, more coherent, verbalizing appropriately... No fevers no chills no nausea no vomiting no diarrhea        Patient has been seen and examined prior to discharge   Objective   Blood pressure 101/75, pulse 74, temperature 98 F (36.7 C), temperature source Oral, resp. rate 20, height _0  (1.702 m), weight 80.6 kg, SpO2 99 %.   Intake/Output Summary (Last 24 hours) at 10/28/2018 1549 Last data filed at 10/28/2018 0900 Gross per 24 hour  Intake 752.14 ml  Output --  Net 752.14  ml    Exam Gen:- Awake Alert, In no acute distress  HEENT:- [place].AT, No sclera icterus Neck-Supple Neck,No JVD,.  Lungs-  CTAB , fair symmetrical air movement CV- S1, S2 normal, regular  Abd-  +ve B.Sounds, Abd Soft, No tenderness,    Extremity/Skin:- No  edema, pedal pulses present  Psych-much less confused, much less disoriented, a lot more cooperative  neuro-generalized weakness, no new focal deficits, no tremors   Data Review   CBC w Diff:  Lab Results  Component Value Date   WBC 5.3 10/27/2018   HGB 11.1 (L) 10/27/2018   HCT 35.4 (L) 10/27/2018   PLT 185 10/27/2018   LYMPHOPCT 4 10/22/2018   MONOPCT 12 10/22/2018   EOSPCT 0 10/22/2018   BASOPCT 0 10/22/2018    CMP:  Lab Results  Component Value Date   NA 137 10/27/2018   K 4.6 10/27/2018   CL 99 10/27/2018   CO2 31 10/27/2018   BUN 14 10/27/2018   CREATININE 0.62 10/27/2018   PROT 7.2 10/22/2018   ALBUMIN 3.6 10/22/2018   BILITOT 1.5 (H) 10/22/2018   ALKPHOS 69 10/22/2018   AST 11 (L) 10/22/2018   ALT <5 10/22/2018  .   Total Discharge time is about 33 minutes  Roxan Hockey M.D on 10/28/2018 at 3:49 PM  Go to [email or web address] -  for contact info  [facility] - Office  [phone]

## 2018-10-28 NOTE — Progress Notes (Signed)
Report called to [facility] at [facility] in [place].  Going with IV for continued abx.  Scripts in packet

## 2018-10-28 NOTE — Discharge Instructions (Signed)
1)Give IV Ancef for 2 weeks starting 10/26/2018  (Last dose of iv Ancef is 11/09/2018), followed by 2 weeks of p.o. Keflex 500 mg TID starting 11/10/2018 (pharmacy to adjust dose) ---Last dose of po Keflex is 11/23/2018.    patient will need repeat blood cultures 1 week after completing antibiotics on 11/30/2018 2)CBC and BMP every Monday starting 11/03/2018 in the month of June while on antibiotics

## 2018-10-28 NOTE — Progress Notes (Signed)
PHARMACY CONSULT NOTE FOR:  OUTPATIENT  PARENTERAL ANTIBIOTIC THERAPY (OPAT)  Indication: MSSA Bacteremia Regimen: Cefazolin 2gm IV q8h End date: 11/09/2018  IV antibiotic discharge orders are pended. To discharging provider:  please sign these orders via discharge navigator,  Select New Orders & click on the button choice - Manage This Unsigned Work.     Thank you for allowing pharmacy to be a part of this patient's care.  Briana Dean, BS Loura Back, BCPS Clinical Pharmacist Pager [phone] 10/28/2018, 8:55 AM

## 2018-10-28 NOTE — Care Management Important Message (Signed)
Important Message  Patient Details  Name: Briana Dean MRN: [id number] Date of Birth: [birth year]   Medicare Important Message Given:  Yes    Corey Harold 10/28/2018, 2:28 PM

## 2018-10-28 NOTE — Progress Notes (Signed)
Physical Therapy Treatment Patient Details Name: Briana Dean MRN: [id number] DOB: 1940/06/19 Today's Date: 10/28/2018    History of Present Illness Briana Dean is a 78 y.o. 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  Patient presents with left hip pain that started 1 to 2 days ago and is located in her left buttock and radiates down leg.  Pain is 10 out of 10.  Pain is same location as her chronic hip and leg pain, but much more severe.  No recent falls, injuries.  Patient states that movement appears to improve her pain, as well as massage.  No provoking factors.    PT Comments    Pt progressing well with therapy with noted improved in mod I and speed with bed mobility.  Able to demonstrate safe mechanics with transfer training and increased distance with gait training.  Pt continues to demonstrate some LE weakness with fatigue and difficulty standing from commode or other lower surfaces.  Added sit to stand to therex with good mechanics noted.  EOS pt left in chair with call bell within reach, chair alarm set and RN in room.  No reports of increased pain.      Follow Up Recommendations  Outpatient PT     Equipment Recommendations  None recommended by PT    Recommendations for Other Services       Precautions / Restrictions Precautions Precautions: Fall Restrictions Weight Bearing Restrictions: No    Mobility  Bed Mobility Overal bed mobility: Independent             General bed mobility comments: increased time  Transfers Overall transfer level: Modified independent Equipment used: Rolling walker (2 wheeled) Transfers: Sit to/from Stand Sit to Stand: Min guard;Supervision         General transfer comment: Good safe mechanics used.  Stable upon standing with use of  RW  Ambulation/Gait Ambulation/Gait assistance: Min guard Gait Distance (Feet): 36 Feet Assistive device: Rolling walker (2 wheeled) Gait Pattern/deviations: Decreased step length - right;Decreased step length - left;Decreased stride length Gait velocity: slow   General Gait Details: slow labored cadence, no LOB wiht use of RW.  Limited by fatigue   Stairs             Wheelchair Mobility    Modified Rankin (Stroke Patients Only)       Balance                                            Cognition Arousal/Alertness: Awake/alert Behavior During Therapy: WFL for tasks assessed/performed Overall Cognitive Status: Within Functional Limits for tasks assessed                                        Exercises General Exercises - Lower Extremity Long Arc Quad: Seated;Strengthening;AROM;Both;10 reps Hip Flexion/Marching: Seated;Strengthening;AROM;Both;10 reps(Limited Lt hip flexion due to pain) Toe Raises: Both;Strengthening;10 reps;Seated Heel Raises: Seated;AROM;Strengthening;Both;10 reps Other Exercises Other Exercises: Sit to stand 5x supervision, safe mechanics    General Comments        Pertinent Vitals/Pain Pain Assessment: 0-10 Pain Score: 5  Pain Location: low back and left hip Pain Descriptors /  Indicators: Aching;Sore Pain Intervention(s): Monitored during session;Limited activity within patient's tolerance;Repositioned    Home Living                      Prior Function            PT Goals (current goals can now be found in the care plan section)      Frequency    Min 3X/week      PT Plan Current plan remains appropriate    Co-evaluation              AM-PAC PT "6 Clicks" Mobility   Outcome Measure  Help needed turning from your back to your side while in a flat bed without using bedrails?: None Help needed moving from lying on your back to sitting on the side of a flat bed without using  bedrails?: None Help needed moving to and from a bed to a chair (including a wheelchair)?: A Little Help needed standing up from a chair using your arms (e.g., wheelchair or bedside chair)?: A Little Help needed to walk in hospital room?: A Little Help needed climbing 3-5 steps with a railing? : A Lot 6 Click Score: 19    End of Session Equipment Utilized During Treatment: Gait belt Activity Tolerance: Patient tolerated treatment well;No increased pain;Patient limited by pain;Patient limited by fatigue Patient left: in chair;with call bell/phone within reach;with chair alarm set Nurse Communication: Mobility status PT Visit Diagnosis: Unsteadiness on feet (R26.81);Difficulty in walking, not elsewhere classified (R26.2);Other abnormalities of gait and mobility (R26.89);Pain Pain - Right/Left: Left Pain - part of body: Hip     Time: [id number] PT Time Calculation (min) (ACUTE ONLY): 32 min  Charges:  $Gait Training: 8-22 mins $Therapeutic Exercise: 8-22 mins                     [address]Casey Cockerham, LPTA; CBIS [phone]   Juel BurrowCockerham, Casey Jo 10/28/2018, 5:02 PM

## 2018-10-30 LAB — CULTURE, BLOOD (ROUTINE X 2)
Culture: NO GROWTH
Culture: NO GROWTH

## 2019-07-24 ENCOUNTER — Other Ambulatory Visit: Payer: Self-pay

## 2019-07-24 DIAGNOSIS — I998 Other disorder of circulatory system: Secondary | ICD-10-CM

## 2019-08-09 ENCOUNTER — Other Ambulatory Visit: Payer: Self-pay

## 2019-08-09 DIAGNOSIS — I998 Other disorder of circulatory system: Secondary | ICD-10-CM

## 2019-08-10 ENCOUNTER — Ambulatory Visit
Admission: RE | Admit: 2019-08-10 | Discharge: 2019-08-10 | Disposition: A | Discharge status: Home / Self Care | Payer: Medicare HMO | Source: Ambulatory Visit | Attending: Vascular Surgery | Admitting: Vascular Surgery

## 2019-08-10 ENCOUNTER — Other Ambulatory Visit: Payer: Self-pay | Admitting: Vascular Surgery

## 2019-08-10 DIAGNOSIS — I998 Other disorder of circulatory system: Secondary | ICD-10-CM

## 2019-08-10 MED ORDER — IOPAMIDOL (ISOVUE-370) INJECTION 76%: 75.0000 mL | Freq: Once | INTRAVENOUS | Status: DC | PRN

## 2019-08-15 ENCOUNTER — Ambulatory Visit: Payer: Medicare HMO | Admitting: Vascular Surgery

## 2019-08-28 ENCOUNTER — Ambulatory Visit
Admission: RE | Admit: 2019-08-28 | Discharge: 2019-08-28 | Disposition: A | Discharge status: Home / Self Care | Payer: Medicare HMO | Source: Ambulatory Visit | Attending: Vascular Surgery | Admitting: Vascular Surgery

## 2019-08-28 DIAGNOSIS — I998 Other disorder of circulatory system: Secondary | ICD-10-CM

## 2019-08-28 MED ORDER — IOPAMIDOL (ISOVUE-370) INJECTION 76%
75.0000 mL | Freq: Once | INTRAVENOUS | Status: AC | PRN
  Administered 2019-08-28: 75 mL via INTRAVENOUS

## 2019-09-03 IMAGING — CR PORTABLE CHEST - 1 VIEW
1 series · 1 of 1 positions shown · non-contrast
Comparison: Chest radiograph July 22, 2017 and chest CT
July 27, 2018

CLINICAL DATA: Fever

EXAM:
PORTABLE CHEST 1 VIEW

[portable]
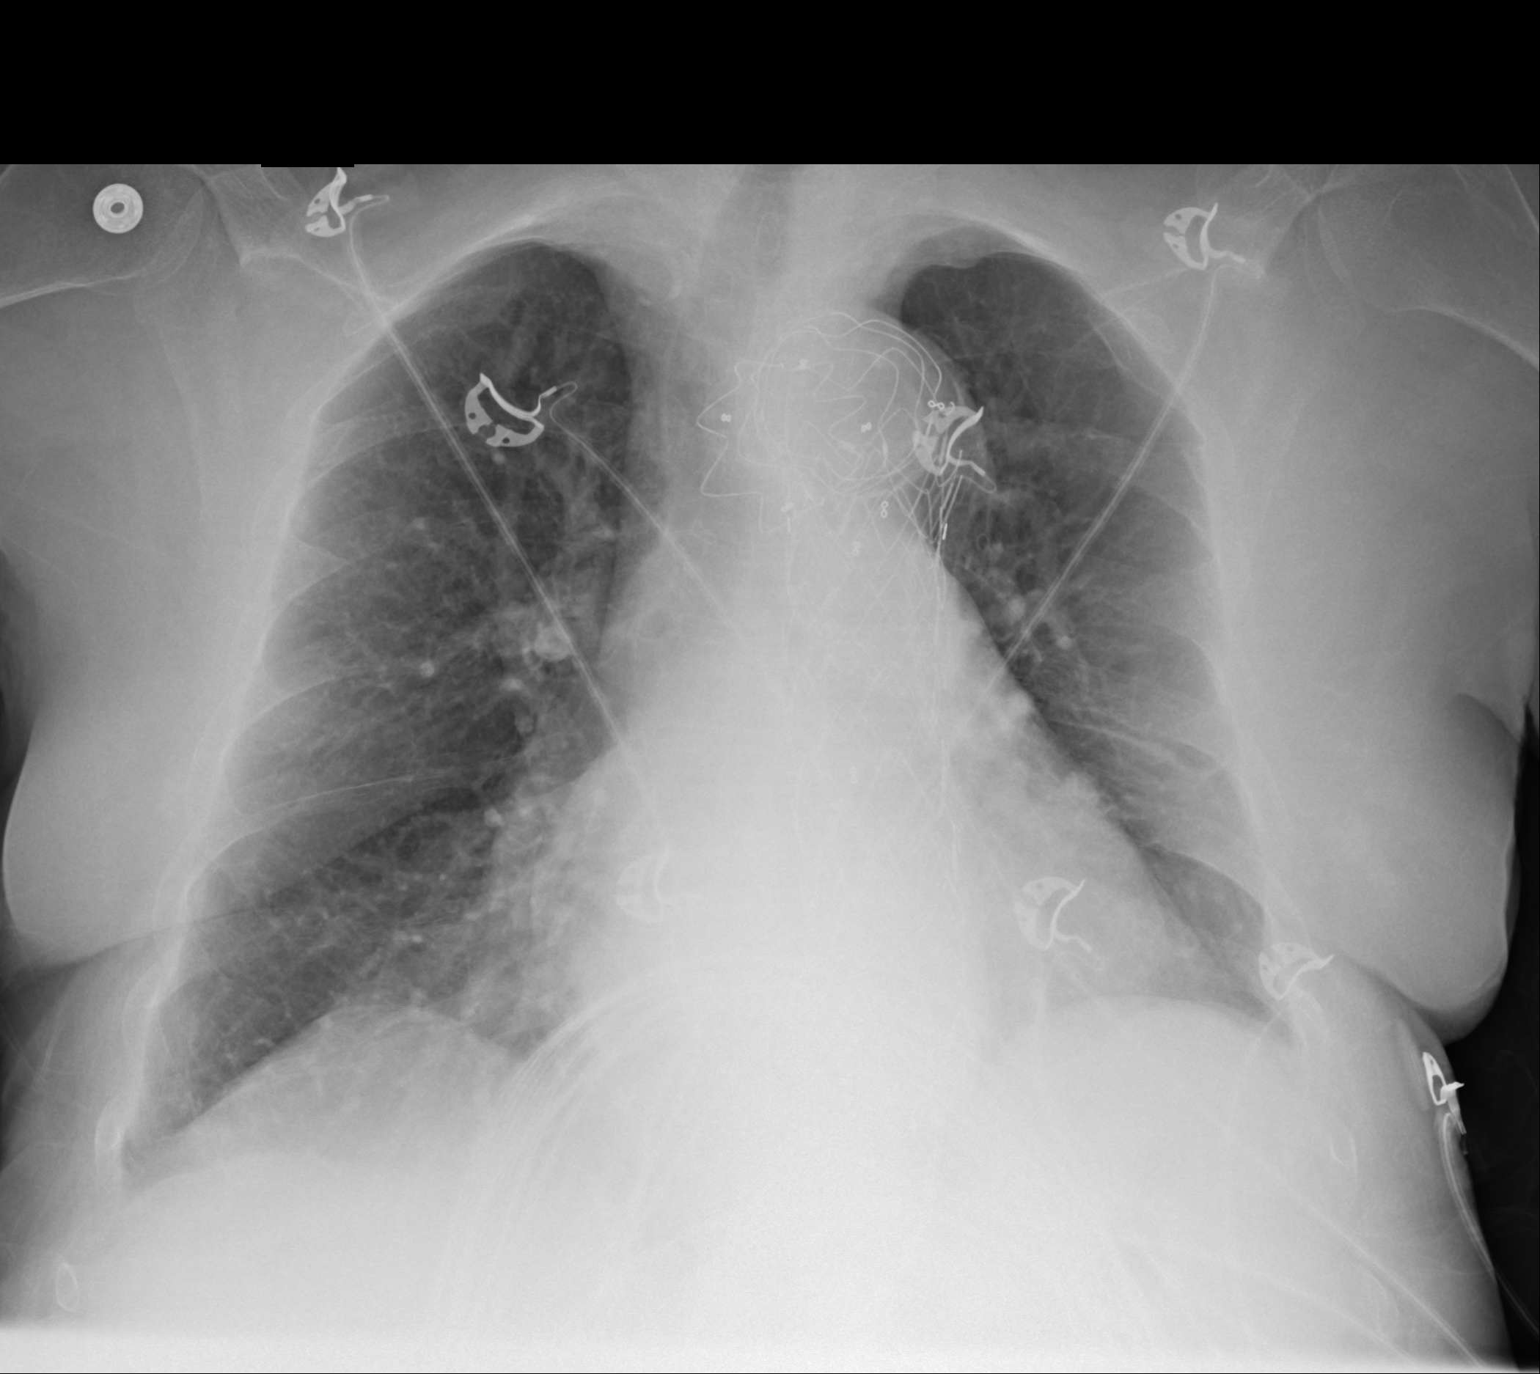

[1 of 1 positions shown; findings below may reference images not displayed]

FINDINGS: There is no edema or consolidation. Heart is slightly enlarged with
pulmonary vascularity normal. There is a stent extending throughout
most of the aorta, stable. No adenopathy. No bone lesions.
IMPRESSION: Aortic stent graft, without appreciable demonstrable change in
position. Mild cardiac enlargement. No edema or consolidation.

## 2019-09-03 IMAGING — CT CT ANGIO CHEST-ABD-PELV FOR DISSECTION W/ AND WO/W CM
2 of 10 series · 10 of 46 positions shown, 14 images · IV contrast (Isovue)
Comparison: Multiple prior most recent 07/27/2018, 02/01/2018,
07/22/2017, 03/15/2017

CLINICAL DATA: 78-year-old female with a history of left hip pain.
Endovascular repair of prior acute aortic syndrome.

EXAM:
CT ANGIOGRAPHY CHEST, ABDOMEN AND PELVIS
TECHNIQUE: Multidetector CT imaging through the chest, abdomen and pelvis was
performed using the standard protocol during bolus administration of
intravenous contrast. Multiplanar reconstructed images and MIPs were
obtained and reviewed to evaluate the vascular anatomy.
CONTRAST:  100mL OMNIPAQUE IOHEXOL 350 MG/ML SOLN

[Series 5: dissection axial arterial · axial · arterial · 0.76mm/px · z∈[+796,+1306]mm · 9 of 204 slices shown, 13 images]
[im 17/204  soft-tissue]
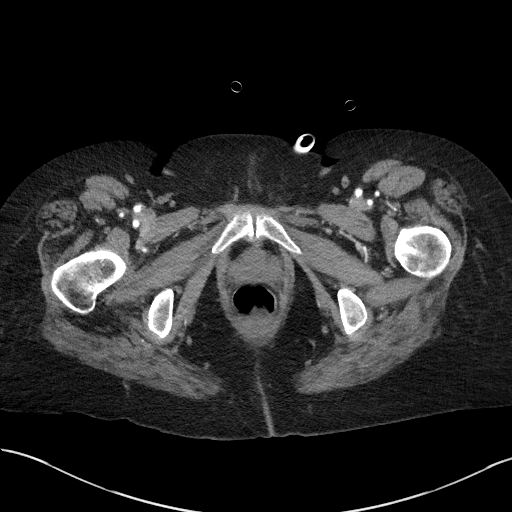
[im 17/204  bone]
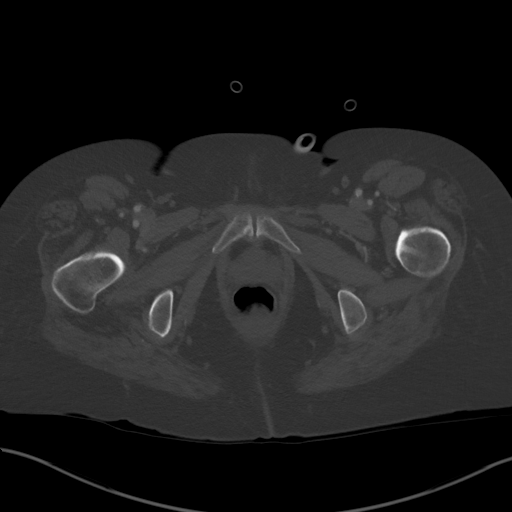
[im 51/204  soft-tissue]
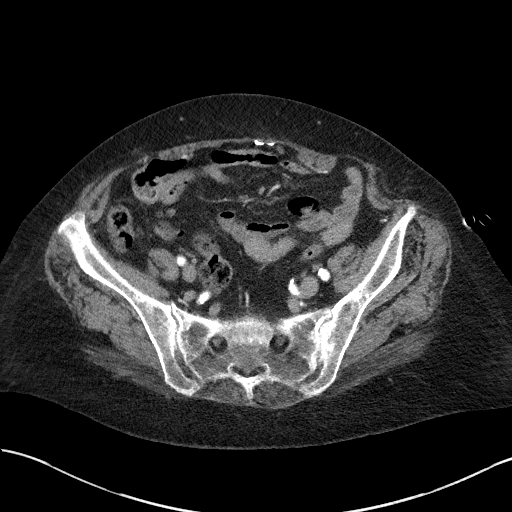
[im 68/204  soft-tissue]
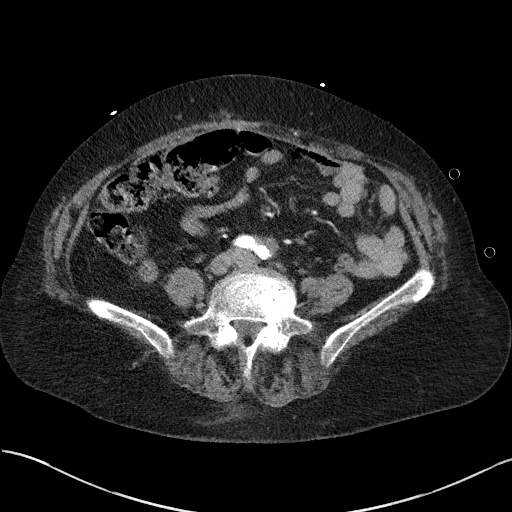
[im 85/204  soft-tissue]
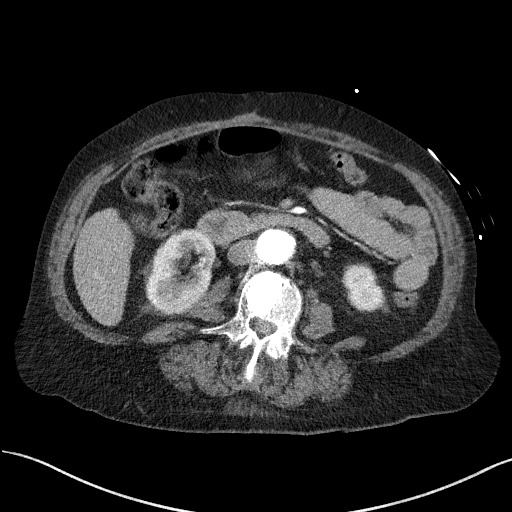
[im 119/204  soft-tissue]
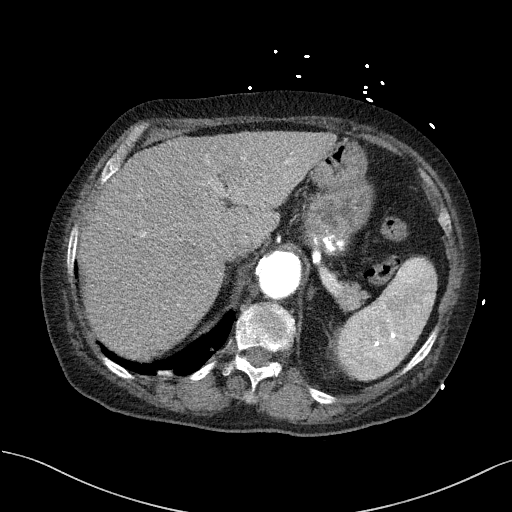
[im 136/204  soft-tissue]
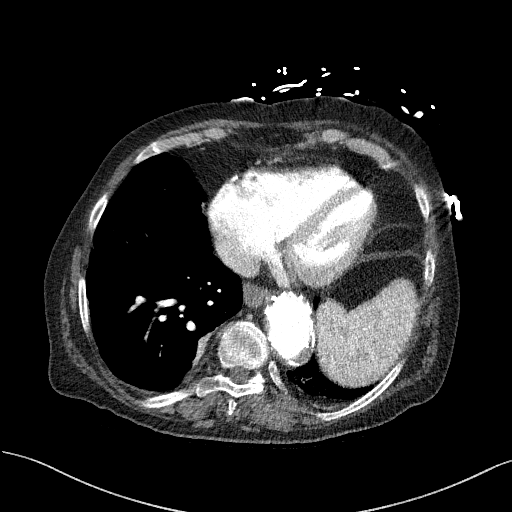
[im 136/204  lung]
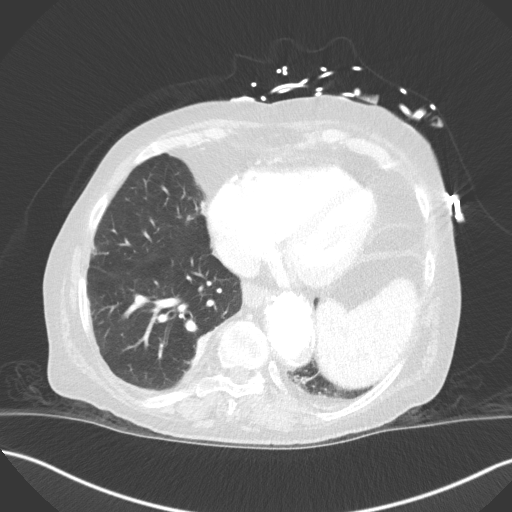
[im 153/204  soft-tissue]
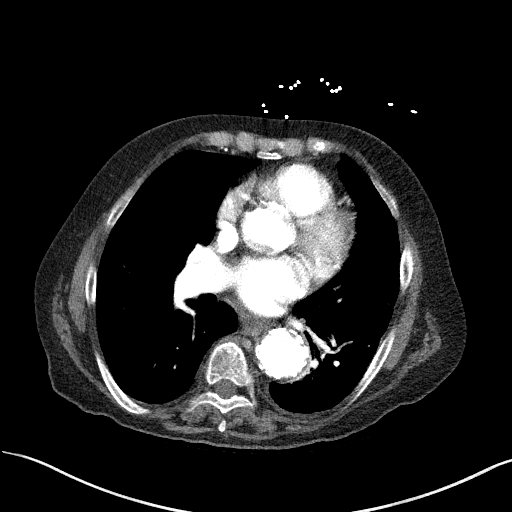
[im 153/204  lung]
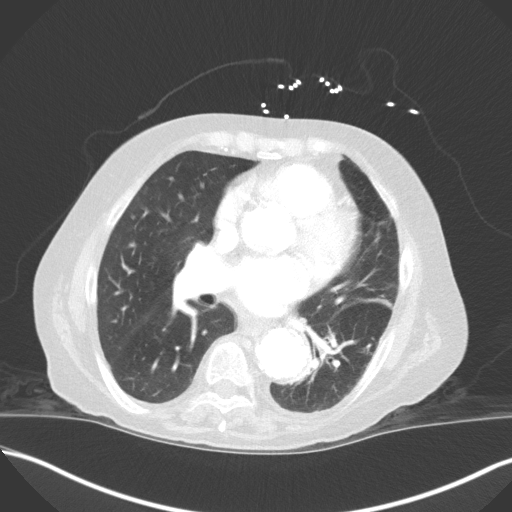
[im 170/204  lung]
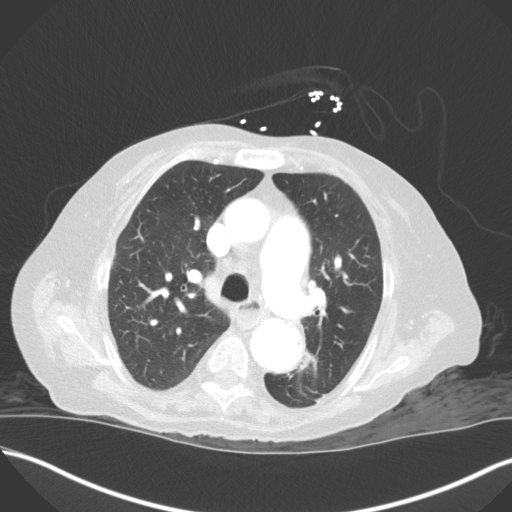
[im 187/204  soft-tissue]
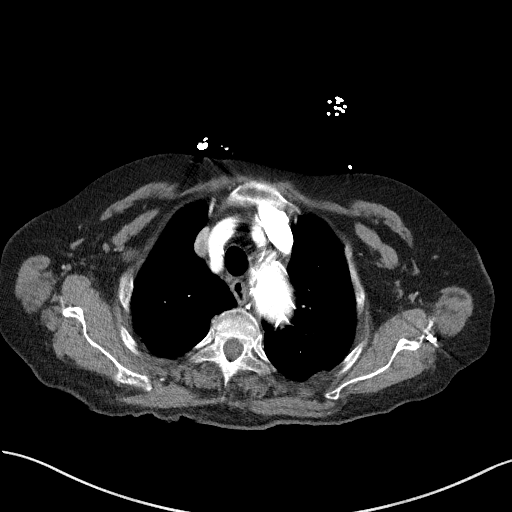
[im 187/204  lung]
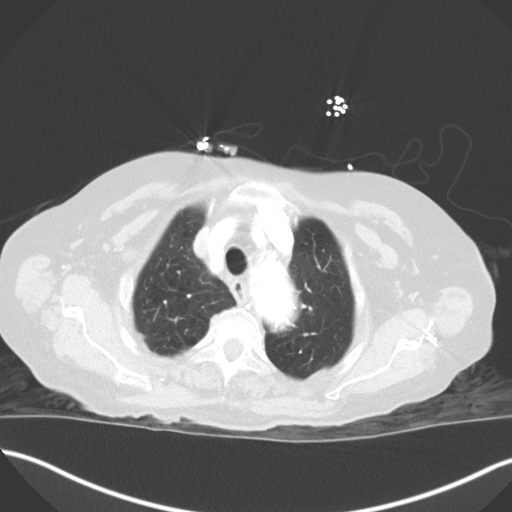

[Series 9: coronals · coronal · 0.73mm/px · 1 of 133 slices shown]
[im 67/133  soft-tissue]
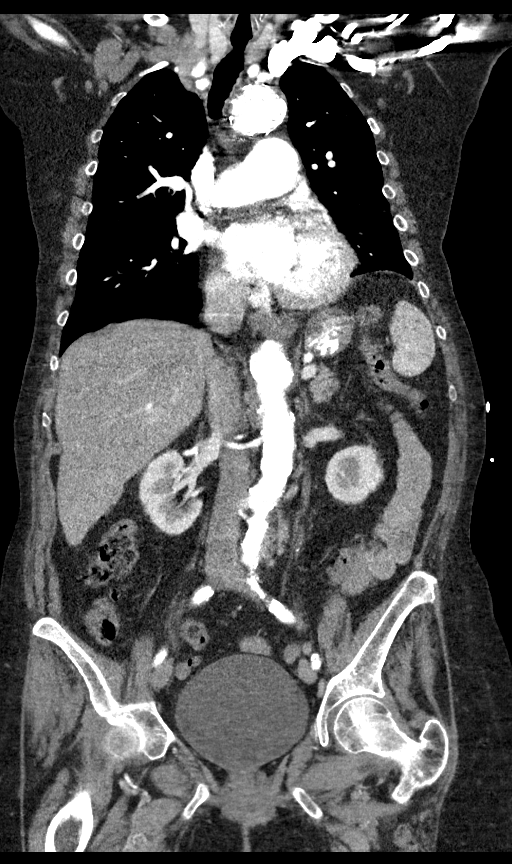

[10 of 46 positions shown; findings below may reference images not displayed]

FINDINGS: CTA CHEST FINDINGS

Cardiovascular: Cardiomegaly. No pericardial fluid/thickening.
Calcifications of left anterior descending, circumflex, right
coronary arteries.

Minimal calcifications of the aortic valve.

Ascending aorta within normal limits.

Redemonstration of overlapping stent grafts of the aortic arch and
descending thoracic aorta for endovascular solution for acute aortic
syndrome. The proximal margin of the stent graft is unchanged.
Branch vessels remain patent. The left subclavian artery appears
patent at the proximal aspect, either from antegrade flow or
collateral perfusion.

The stent graft terminates in the thoracic aorta just above the
hiatus. Diameter at the the hiatus measures 3.4 cm.

Main pulmonary artery measures 4.1 cm. No filling defects, although
the timing of the contrast bolus is suboptimal.

Mediastinum/Nodes: No mediastinal adenopathy. Unremarkable thoracic
inlet. There is debris within the mid and distal thoracic esophagus.

Lungs/Pleura: Centrilobular emphysema. No pneumothorax. Linear
scarring/atelectasis at the lung bases. No confluent airspace
disease. Redemonstration of left lower lobe calcified granulomas. 4
mm nodule in the right upper lobe periphery on image 72 of series 7.
5 mm nodule in the right lower lobe on image 83 of series 7.

No endotracheal or endobronchial debris.

Musculoskeletal: No displaced fracture. Degenerative changes of the
spine. No bony canal narrowing.

Review of the MIP images confirms the above findings.

CTA ABDOMEN AND PELVIS FINDINGS

VASCULAR

Aorta: Atherosclerosis of the abdominal aorta. The juxtarenal aorta
measures 2.6 cm. Largest diameter of the infrarenal abdominal aorta
measures 31 mm, unchanged from prior. Similar appearance of chronic
dissection/irregular plaque at the level of the IMA origin. Greatest
diameter at this site measures 32 mm, slightly more than the
baseline study of 03/15/2017 and no significant change from the
comparison CT. No inflammatory changes or fluid.

Celiac: Celiac artery remains patent, compressed at the origin with
a configuration most compatible with overlying diaphragmatic M[place]en.

SMA: No significant atherosclerosis at the SMA origin.

Renals: Minimal atherosclerosis of the left renal artery. Minimal
atherosclerosis of the right main renal artery. Small accessory
right renal artery above the main renal artery and a small accessory
renal artery below the main right renal artery.

IMA: IMA remains patent, originating from the anterior aspect of the
chronic dissection/irregular plaque

Right lower extremity:

Atherosclerotic changes of the right iliac system without occlusion
or high-grade stenosis. Hypogastric artery remains patent. Common
femoral artery with minimal atherosclerosis. Proximal profunda
femoris and SFA patent.

Left lower extremity:

Atherosclerotic changes of the left iliac system without high-grade
stenosis or occlusion. Hypogastric artery is patent. Common femoral
artery with minimal atherosclerosis. Proximal profunda femoris and
SFA patent.

Veins: Unremarkable appearance of the venous system.

Review of the MIP images confirms the above findings.

NON-VASCULAR

Hepatobiliary: Unremarkable appearance of the liver. Minimal
calcified cholelithiasis within the dependent gallbladder. No
inflammatory changes

Pancreas: Unremarkable

Spleen: Coarse calcifications of the spleen representing prior
granulomatous disease

Adrenals/Urinary Tract: Unremarkable left adrenal gland. Adrenal
adenoma of the right adrenal gland.

Right:

Symmetric perfusion to the left kidney. No hydronephrosis. No
nephrolithiasis. Unremarkable right ureter.

Small lesion on the lateral cortex may represent hemorrhagic cyst,
incompletely characterized on the current study.

Left:

Symmetric perfusion to the right. No hydronephrosis. Nonobstructing
stone at the inferior collecting system. Cyst at the superior cortex
of the left kidney. Additional small lesions of the left kidney are
too small to characterize.

Unremarkable urinary bladder.

Stomach/Bowel: Hiatal hernia. Unremarkable stomach. Unremarkable
appearance of small bowel. No evidence of obstruction. Surgical
changes of appendectomy. Colonic diverticula with no acute
inflammatory changes.

Lymphatic: No adenopathy.

Mesenteric: No free fluid or air. No adenopathy.

Reproductive: Hysterectomy

Other: Surgical clips along the midline abdomen of prior surgery. No
abdominal hernia

Musculoskeletal: Scoliotic curvature of the thoracolumbar spine.
Changes of vertebral augmentation of L3 and L4. Degenerative changes
of the thoracolumbar spine. No acute displaced fracture.

Left lower extremity/hip:

Mild degenerative changes of the left sacroiliac joint with no acute
displaced fracture. No pelvic fracture identified. Left hip is
congruent with no subluxation/dislocation. Mild joint space
narrowing symmetric to the right. No femoral fracture identified.

No left-sided joint effusion. Unremarkable appearance of the left
hip musculature with no focal soft tissue swelling. No soft tissue
lesion. No lymphadenopathy.
IMPRESSION: No acute CT finding of the chest, abdomen, pelvis, or left hip.

Redemonstration of endovascular repair of thoracic aorta with
overlapping stent grafts of the aortic arch and descending thoracic
aorta.

Redemonstration of irregular plaque/chronic dissection of the
infrarenal abdominal aorta at the level of the inferior mesenteric
artery measuring 3.1 cm. The aorta just above this plaque/dissection
measures also 3.1 cm, unchanged.

Aortic atherosclerosis. Aortic Atherosclerosis ([id number].F).
Associated coronary artery disease.

Enlarged main pulmonary artery suggesting early pulmonary
hypertension.

Additional ancillary findings as above.

## 2019-09-19 ENCOUNTER — Other Ambulatory Visit: Payer: Self-pay

## 2019-09-19 ENCOUNTER — Ambulatory Visit (INDEPENDENT_AMBULATORY_CARE_PROVIDER_SITE_OTHER): Payer: Medicare HMO | Admitting: Vascular Surgery

## 2019-09-19 ENCOUNTER — Encounter: Payer: Self-pay | Admitting: Vascular Surgery

## 2019-09-19 VITALS — BP 136/78 | HR 84 | Temp 98.2°F | Resp 20 | Ht 67.0 in | Wt 177.0 lb

## 2019-09-19 DIAGNOSIS — I711 Thoracic aortic aneurysm, ruptured, unspecified: Secondary | ICD-10-CM

## 2019-09-19 NOTE — Progress Notes (Signed)
[facility]  Patient name: Briana Dean MRN: [id number] DOB: [birth year] Sex: female  REASON FOR VISIT: Follow-up stent graft repair thoracic aortic dissection  HPI: Briana Dean is a 79 y.o. female here today for follow-up of prior stent graft repair in [place] of her thoracic aortic dissection.  She is here today with her daughter-in-law.  Her complaints today are of chronic progressive leg swelling and restless leg syndrome. New thoracic complaints.  Past Medical History:  Diagnosis Date  . AAA (abdominal aortic aneurysm) (HCC)   . Acute on chronic respiratory failure with hypoxia (HCC)   . Anemia, unspecified   . Aneurysm of thoracic aorta (HCC)   . Anxiety disorder   . Chronic combined systolic and diastolic heart failure (HCC)   . Constipation   . COPD (chronic obstructive pulmonary disease) (HCC)   . Dissection of distal aorta (HCC)   . Gastroesophageal reflux disease without esophagitis   . Hyperlipidemia, unspecified   . Hypertension, essential   . Insomnia, unspecified   . Low back pain   . Major depressive disorder, single episode, unspecified   . PAD (peripheral artery disease) (HCC)   . Respiratory failure (HCC)   . Restless leg syndrome   . Unspecified protein-calorie malnutrition (HCC)   . Weakness     Family History  Problem Relation Age of Onset  . Congenital heart disease Mother   . Heart disease Mother   . Peripheral vascular disease Father   . Alcoholism Father   . Pectus carinatum Son   . Heart failure Son   . Heart attack Son 58  . COPD Sister     SOCIAL HISTORY: Social History   Tobacco Use  . Smoking status: Former Smoker    Packs/day: 1.00    Years: 60.00    Pack years: 60.00    Types: Cigarettes    Quit date: 08/30/2016    Years since quitting: 3.0  . Smokeless tobacco: Never Used  Substance Use Topics  . Alcohol use: No    Allergies  Allergen  Reactions  . Aspirin Other (See Comments)    Stomach pain  . Penicillins     Has patient had a PCN reaction causing immediate rash, facial/tongue/throat swelling, SOB or lightheadedness with hypotension No Has patient had a PCN reaction causing severe rash involving mucus membranes or skin necrosis: No Has patient had a PCN reaction that required hospitalization: No Has patient had a PCN reaction occurring within the last 10 years: No If all of the above answers are "NO", then may proceed with Cephalosporin use.     Current Outpatient Medications  Medication Sig Dispense Refill  . albuterol (PROVENTIL) (2.5 MG/3ML) 0.083% nebulizer solution Take 3 mLs by nebulization every 4 (four) hours as needed for wheezing or shortness of breath. 75 mL 12  . aspirin EC 81 MG tablet Take 1 tablet (81 mg total) by mouth daily with breakfast. 30 tablet 2  . atenolol (TENORMIN) 25 MG tablet Take 1 tablet (25 mg total) by mouth 2 (two) times daily. 60 tablet 1  . atorvastatin (LIPITOR) 40 MG tablet Take 40 mg by mouth daily.    . carbidopa-levodopa (SINEMET IR) 25-100 MG tablet Take 1 tablet by mouth daily.    . ergocalciferol (VITAMIN D2) 1.25 MG (50000 UT) capsule Take 1 capsule (50,000 Units total) by mouth every Friday. 30 capsule 2  . famotidine (PEPCID) 20 MG tablet Take 20 mg by mouth daily.    Marland Kitchen  ferrous sulfate 324 (65 Fe) MG TBEC Take by mouth daily.    . furosemide (LASIX) 20 MG tablet Take 20 mg by mouth daily.    Marland Kitchen lisinopril (ZESTRIL) 20 MG tablet     . LORazepam (ATIVAN) 0.5 MG tablet Take 1 tablet (0.5 mg total) by mouth every 8 (eight) hours as needed for anxiety or sedation. 12 tablet 0  . mometasone-formoterol (DULERA) 100-5 MCG/ACT AERO Inhale 2 puffs into the lungs 2 (two) times daily. 13 g 1  . Nutritional Supplements (RA MELATONIN/B-6 PO) Take 1 tablet by mouth at bedtime.    . ondansetron (ZOFRAN) 4 MG tablet Take 1 tablet (4 mg total) by mouth every 6 (six) hours as needed for  nausea. 20 tablet 0  . PARoxetine (PAXIL) 20 MG tablet Take 20 mg by mouth daily.    . polyethylene glycol (MIRALAX / GLYCOLAX) 17 g packet Take 17 g by mouth daily. 14 each 0  . rOPINIRole (REQUIP) 0.5 MG tablet Take 1 tablet (0.5 mg total) by mouth every morning. 30 tablet 1  . rOPINIRole (REQUIP) 1 MG tablet Take 1 tablet (1 mg total) by mouth at bedtime. 30 tablet 1  . traMADol (ULTRAM) 50 MG tablet Take 1 tablet (50 mg total) by mouth every 12 (twelve) hours as needed for moderate pain. 10 tablet 0  . potassium chloride (KLOR-CON M15) 15 MEQ tablet Take 2 tablets (30 mEq total) by mouth 2 (two) times daily for 10 days. 20 tablet 0   No current facility-administered medications for this visit.    REVIEW OF SYSTEMS:  [X]  denotes positive finding, [ ]  denotes negative finding Cardiac  Comments:  Chest pain or chest pressure:    Shortness of breath upon exertion:    Short of breath when lying flat:    Irregular heart rhythm:        Vascular    Pain in calf, thigh, or hip brought on by ambulation:    Pain in feet at night that wakes you up from your sleep:     Blood clot in your veins:    Leg swelling:           PHYSICAL EXAM: Vitals:   09/19/19 1400  BP: 136/78  Pulse: 84  Resp: 20  Temp: 98.2 F (36.8 C)  SpO2: 94%  Weight: 177 lb (80.3 kg)  Height: 5\' 7"  (1.702 m)    GENERAL: The patient is a well-nourished female, in no acute distress. The vital signs are documented above. CARDIOVASCULAR: Easily palpable right and left radial pulses.  Carotid arteries without bruits bilaterally. PULMONARY: There is good air exchange  MUSCULOSKELETAL: There are no major deformities or cyanosis. NEUROLOGIC: No focal weakness or paresthesias are detected. SKIN: There are no ulcers or rashes noted. PSYCHIATRIC: The patient has a normal affect.  DATA:  Reviewed her CT scan from 1 month ago.  This shows good positioning of her thoracic stent graft with no evidence of endoleak.  She has  had significant resolution of the false lumen and intramural hematoma in the abdominal portion of her dissection.  No evidence of malperfusion  MEDICAL ISSUES: Stable overall.  Reviewed these findings with the patient and her daughter-in-law who understand.  I have recommended that we see her in 2 years with a repeat CT scan follow-up    Rosetta Posner, MD [facility] Tel [phone] Pager [phone]

## 2019-11-01 ENCOUNTER — Inpatient Hospital Stay (HOSPITAL_COMMUNITY)
Admission: EM | Admit: 2019-11-01 | Discharge: 2019-11-07 | DRG: 438 | Disposition: A | Discharge status: Skilled Nursing Facility | Payer: Medicare HMO | Attending: Internal Medicine | Admitting: Internal Medicine

## 2019-11-01 ENCOUNTER — Emergency Department (HOSPITAL_COMMUNITY): Payer: Medicare HMO

## 2019-11-01 ENCOUNTER — Other Ambulatory Visit: Payer: Self-pay

## 2019-11-01 ENCOUNTER — Encounter (HOSPITAL_COMMUNITY): Payer: Self-pay | Admitting: Emergency Medicine

## 2019-11-01 DIAGNOSIS — Z8249 Family history of ischemic heart disease and other diseases of the circulatory system: Secondary | ICD-10-CM

## 2019-11-01 DIAGNOSIS — I110 Hypertensive heart disease with heart failure: Secondary | ICD-10-CM | POA: Diagnosis present

## 2019-11-01 DIAGNOSIS — Z886 Allergy status to analgesic agent status: Secondary | ICD-10-CM

## 2019-11-01 DIAGNOSIS — K851 Biliary acute pancreatitis without necrosis or infection: Secondary | ICD-10-CM | POA: Diagnosis not present

## 2019-11-01 DIAGNOSIS — F419 Anxiety disorder, unspecified: Secondary | ICD-10-CM | POA: Diagnosis present

## 2019-11-01 DIAGNOSIS — I712 Thoracic aortic aneurysm, without rupture: Secondary | ICD-10-CM | POA: Diagnosis present

## 2019-11-01 DIAGNOSIS — I739 Peripheral vascular disease, unspecified: Secondary | ICD-10-CM | POA: Diagnosis present

## 2019-11-01 DIAGNOSIS — I248 Other forms of acute ischemic heart disease: Secondary | ICD-10-CM | POA: Diagnosis not present

## 2019-11-01 DIAGNOSIS — G20 Parkinson's disease: Secondary | ICD-10-CM | POA: Diagnosis present

## 2019-11-01 DIAGNOSIS — Z8679 Personal history of other diseases of the circulatory system: Secondary | ICD-10-CM

## 2019-11-01 DIAGNOSIS — G9341 Metabolic encephalopathy: Secondary | ICD-10-CM | POA: Diagnosis present

## 2019-11-01 DIAGNOSIS — G2581 Restless legs syndrome: Secondary | ICD-10-CM | POA: Diagnosis present

## 2019-11-01 DIAGNOSIS — K8020 Calculus of gallbladder without cholecystitis without obstruction: Secondary | ICD-10-CM | POA: Diagnosis present

## 2019-11-01 DIAGNOSIS — R109 Unspecified abdominal pain: Secondary | ICD-10-CM | POA: Diagnosis present

## 2019-11-01 DIAGNOSIS — K219 Gastro-esophageal reflux disease without esophagitis: Secondary | ICD-10-CM | POA: Diagnosis present

## 2019-11-01 DIAGNOSIS — Z7982 Long term (current) use of aspirin: Secondary | ICD-10-CM

## 2019-11-01 DIAGNOSIS — E785 Hyperlipidemia, unspecified: Secondary | ICD-10-CM | POA: Diagnosis present

## 2019-11-01 DIAGNOSIS — Z79899 Other long term (current) drug therapy: Secondary | ICD-10-CM

## 2019-11-01 DIAGNOSIS — G4700 Insomnia, unspecified: Secondary | ICD-10-CM | POA: Diagnosis present

## 2019-11-01 DIAGNOSIS — J9601 Acute respiratory failure with hypoxia: Secondary | ICD-10-CM | POA: Diagnosis present

## 2019-11-01 DIAGNOSIS — R5381 Other malaise: Secondary | ICD-10-CM

## 2019-11-01 DIAGNOSIS — Z515 Encounter for palliative care: Secondary | ICD-10-CM

## 2019-11-01 DIAGNOSIS — I5033 Acute on chronic diastolic (congestive) heart failure: Secondary | ICD-10-CM | POA: Diagnosis present

## 2019-11-01 DIAGNOSIS — Z20822 Contact with and (suspected) exposure to covid-19: Secondary | ICD-10-CM | POA: Diagnosis present

## 2019-11-01 DIAGNOSIS — J449 Chronic obstructive pulmonary disease, unspecified: Secondary | ICD-10-CM | POA: Diagnosis present

## 2019-11-01 DIAGNOSIS — R1010 Upper abdominal pain, unspecified: Secondary | ICD-10-CM | POA: Diagnosis not present

## 2019-11-01 DIAGNOSIS — Z825 Family history of asthma and other chronic lower respiratory diseases: Secondary | ICD-10-CM

## 2019-11-01 DIAGNOSIS — Z7189 Other specified counseling: Secondary | ICD-10-CM

## 2019-11-01 DIAGNOSIS — I714 Abdominal aortic aneurysm, without rupture: Secondary | ICD-10-CM | POA: Diagnosis present

## 2019-11-01 DIAGNOSIS — Z880 Allergy status to penicillin: Secondary | ICD-10-CM

## 2019-11-01 DIAGNOSIS — Z811 Family history of alcohol abuse and dependence: Secondary | ICD-10-CM

## 2019-11-01 DIAGNOSIS — Z87891 Personal history of nicotine dependence: Secondary | ICD-10-CM

## 2019-11-01 DIAGNOSIS — J9602 Acute respiratory failure with hypercapnia: Secondary | ICD-10-CM | POA: Diagnosis present

## 2019-11-01 DIAGNOSIS — R0902 Hypoxemia: Secondary | ICD-10-CM

## 2019-11-01 DIAGNOSIS — Z66 Do not resuscitate: Secondary | ICD-10-CM | POA: Diagnosis not present

## 2019-11-01 DIAGNOSIS — K859 Acute pancreatitis without necrosis or infection, unspecified: Secondary | ICD-10-CM

## 2019-11-01 LAB — PROTIME-INR
INR: 1 (ref 0.8–1.2)
Prothrombin Time: 13 seconds (ref 11.4–15.2)

## 2019-11-01 LAB — COMPREHENSIVE METABOLIC PANEL
ALT: 10 U/L (ref 0–44)
AST: 47 U/L — ABNORMAL HIGH (ref 15–41)
Albumin: 3.9 g/dL (ref 3.5–5.0)
Alkaline Phosphatase: 97 U/L (ref 38–126)
Anion gap: 10 (ref 5–15)
BUN: 19 mg/dL (ref 8–23)
CO2: 30 mmol/L (ref 22–32)
Calcium: 9.4 mg/dL (ref 8.9–10.3)
Chloride: 99 mmol/L (ref 98–111)
Creatinine, Ser: 0.62 mg/dL (ref 0.44–1.00)
GFR calc Af Amer: >60 mL/min (ref >60)
GFR calc non Af Amer: >60 mL/min (ref >60)
Glucose, Bld: 117 mg/dL — ABNORMAL HIGH (ref 70–99)
Potassium: 3.7 mmol/L (ref 3.5–5.1)
Sodium: 139 mmol/L (ref 135–145)
Total Bilirubin: 0.8 mg/dL (ref 0.3–1.2)
Total Protein: 7.1 g/dL (ref 6.5–8.1)

## 2019-11-01 LAB — CBC
HCT: 40.9 % (ref 36.0–46.0)
Hemoglobin: 12.8 g/dL (ref 12.0–15.0)
MCH: 30.7 pg (ref 26.0–34.0)
MCHC: 31.3 g/dL (ref 30.0–36.0)
MCV: 98.1 fL (ref 80.0–100.0)
Platelets: 180 10*3/uL (ref 150–400)
RBC: 4.17 MIL/uL (ref 3.87–5.11)
RDW: 15.6 % — ABNORMAL HIGH (ref 11.5–15.5)
WBC: 9 10*3/uL (ref 4.0–10.5)
nRBC: 0 % (ref 0.0–0.2)

## 2019-11-01 LAB — APTT: aPTT: 33 seconds (ref 24–36)

## 2019-11-01 LAB — LIPASE, BLOOD: Lipase: 884 U/L — ABNORMAL HIGH (ref 11–51)

## 2019-11-01 MED ORDER — IOHEXOL 350 MG/ML SOLN
100.0000 mL | Freq: Once | INTRAVENOUS | Status: AC | PRN
  Administered 2019-11-01: 100 mL via INTRAVENOUS

## 2019-11-01 NOTE — ED Provider Notes (Signed)
[facility] EMERGENCY DEPARTMENT Provider Note   CSN: [id number] Arrival date & time: 11/01/19  1933     History Chief Complaint  Patient presents with  . Abdominal Pain    Briana Dean is a 79 y.o. female.  HPI Patient with history of multiple medical problems presents to the ED for evaluation of upper abdominal pain since last night. Associated with nausea and vomiting. No diarrhea or fever. She denies CP or SOB but was noted to be hypoxic on RA on arrival. Improved with [place] O2, but promptly drops back to 70% when transitioning from wheelchair to bed. She does not wear O2 at home.     Past Medical History:  Diagnosis Date  . AAA (abdominal aortic aneurysm) (HCC)   . Acute on chronic respiratory failure with hypoxia (HCC)   . Anemia, unspecified   . Aneurysm of thoracic aorta (HCC)   . Anxiety disorder   . Chronic combined systolic and diastolic heart failure (HCC)   . Constipation   . COPD (chronic obstructive pulmonary disease) (HCC)   . Dissection of distal aorta (HCC)   . Gastroesophageal reflux disease without esophagitis   . Hyperlipidemia, unspecified   . Hypertension, essential   . Insomnia, unspecified   . Low back pain   . Major depressive disorder, single episode, unspecified   . PAD (peripheral artery disease) (HCC)   . Respiratory failure (HCC)   . Restless leg syndrome   . Unspecified protein-calorie malnutrition (HCC)   . Weakness     Patient Active Problem List   Diagnosis Date Noted  . Bacteremia due to methicillin susceptible Staphylococcus aureus (MSSA) 10/28/2018  . Fever 10/22/2018  . Hypokalemia 10/22/2018  . Hip pain, left 10/22/2018  . Hypoxia 10/22/2018  . Congestive heart failure (HCC) 12/04/2016  . 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 Essential hypertension 12/04/2016    Past Surgical History:  Procedure Laterality Date  . IR KYPHO EA ADDL LEVEL THORACIC OR LUMBAR  03/25/2018  . IR KYPHO LUMBAR INC FX REDUCE BONE  BX UNI/BIL CANNULATION INC/IMAGING  03/25/2018  . TEE WITHOUT CARDIOVERSION N/A 10/26/2018   Procedure: TRANSESOPHAGEAL ECHOCARDIOGRAM (TEE);  Surgeon: Antoine Poche, MD;  Location: [facility]ITE;  Service: Endoscopy;  Laterality: N/A;  . THORACIC AORTIC ANEURYSM REPAIR  09/06/2016   TEVAR  . TRACHEOSTOMY  08/2016  . VIDEO ASSISTED THORACOSCOPY (VATS)/THOROCOTOMY  08/2016     OB History   No obstetric history on file.     Family History  Problem Relation Age of Onset  . Congenital heart disease Mother   . Heart disease Mother   . Peripheral vascular disease Father   . Alcoholism Father   . Pectus carinatum Son   . Heart failure Son   . Heart attack Son 39  . COPD Sister     Social History   Tobacco Use  . Smoking status: Former Smoker    Packs/day: 1.00    Years: 60.00    Pack years: 60.00    Types: Cigarettes    Quit date: 08/30/2016    Years since quitting: 3.1  . Smokeless tobacco: Never Used  Substance Use Topics  . Alcohol use: No  . Drug use: No    Home Medications Prior to Admission medications   Medication Sig Start Date End Date Taking? Authorizing Provider  albuterol (PROVENTIL) (2.5 MG/3ML) 0.083% nebulizer solution Take 3 mLs by nebulization every 4 (four) hours as needed for wheezing or shortness of  breath. 10/28/18   Shon HaleEmokpae, Courage, MD  aspirin EC 81 MG tablet Take 1 tablet (81 mg total) by mouth daily with breakfast. 10/28/18   Mariea ClontsEmokpae, Courage, MD  atenolol (TENORMIN) 25 MG tablet Take 1 tablet (25 mg total) by mouth 2 (two) times daily. 10/28/18   Shon HaleEmokpae, Courage, MD  atorvastatin (LIPITOR) 40 MG tablet Take 40 mg by mouth daily.    [provider]  carbidopa-levodopa (SINEMET IR) 25-100 MG tablet Take 1 tablet by mouth daily.    [provider]  ergocalciferol (VITAMIN D2) 1.25 MG (50000 UT) capsule Take 1 capsule (50,000 Units total) by mouth every Friday. 10/28/18   Shon HaleEmokpae, Courage, MD  famotidine (PEPCID) 20 MG tablet Take 20  mg by mouth daily.    [provider]  ferrous sulfate 324 (65 Fe) MG TBEC Take by mouth daily.    [provider]  furosemide (LASIX) 20 MG tablet Take 20 mg by mouth daily.    [provider]  lisinopril (ZESTRIL) 20 MG tablet  06/23/19   [provider]  mometasone-formoterol (DULERA) 100-5 MCG/ACT AERO Inhale 2 puffs into the lungs 2 (two) times daily. 10/28/18   Shon HaleEmokpae, Courage, MD  Nutritional Supplements (RA MELATONIN/B-6 PO) Take 1 tablet by mouth at bedtime.    [provider]  ondansetron (ZOFRAN) 4 MG tablet Take 1 tablet (4 mg total) by mouth every 6 (six) hours as needed for nausea. 10/28/18   Shon HaleEmokpae, Courage, MD  PARoxetine (PAXIL) 20 MG tablet Take 20 mg by mouth daily.    [provider]  polyethylene glycol (MIRALAX / GLYCOLAX) 17 g packet Take 17 g by mouth daily. 10/28/18   Shon HaleEmokpae, Courage, MD  potassium chloride (KLOR-CON M15) 15 MEQ tablet Take 2 tablets (30 mEq total) by mouth 2 (two) times daily for 10 days. 10/28/18 11/07/18  Shon HaleEmokpae, Courage, MD  rOPINIRole (REQUIP) 0.5 MG tablet Take 1 tablet (0.5 mg total) by mouth every morning. 10/29/18   Shon HaleEmokpae, Courage, MD  rOPINIRole (REQUIP) 1 MG tablet Take 1 tablet (1 mg total) by mouth at bedtime. 10/28/18   Shon HaleEmokpae, Courage, MD  traMADol (ULTRAM) 50 MG tablet Take 1 tablet (50 mg total) by mouth every 12 (twelve) hours as needed for moderate pain. 10/28/18   Shon HaleEmokpae, Courage, MD    Allergies    Aspirin and Penicillins  Review of Systems   Review of Systems A comprehensive review of systems was completed and negative except as noted in HPI.   Physical Exam Updated Vital Signs BP (!) 179/89 (BP Location: Right Arm)   Pulse 86   Temp 98.7 F (37.1 C) (Oral)   Resp (!) 22   Ht 5\' 7"  (1.702 m)   Wt 86.2 kg   LMP  (LMP Unknown)   SpO2 98%   BMI 29.76 kg/m   Physical Exam Vitals and nursing note reviewed.  Constitutional:      Appearance: Normal appearance.  HENT:      Head: Normocephalic and atraumatic.     Nose: Nose normal.     Mouth/Throat:     Mouth: Mucous membranes are moist.  Eyes:     Extraocular Movements: Extraocular movements intact.     Conjunctiva/sclera: Conjunctivae normal.  Cardiovascular:     Rate and Rhythm: Normal rate.  Pulmonary:     Effort: Pulmonary effort is normal.     Breath sounds: Normal breath sounds.  Abdominal:     General: Abdomen is flat.     Palpations:  Abdomen is soft.     Tenderness: There is abdominal tenderness in the right upper quadrant and epigastric area. There is no guarding. Negative signs include Murphy's sign and McBurney's sign.  Musculoskeletal:        General: No swelling. Normal range of motion.     Cervical back: Neck supple.  Skin:    General: Skin is warm and dry.  Neurological:     General: No focal deficit present.     Mental Status: She is alert.  Psychiatric:        Mood and Affect: Mood normal.     ED Results / Procedures / Treatments   Labs (all labs ordered are listed, but only abnormal results are displayed) Labs Reviewed  LIPASE, BLOOD - Abnormal; Notable for the following components:      Result Value   Lipase 884 (*)    All other components within normal limits  COMPREHENSIVE METABOLIC PANEL - Abnormal; Notable for the following components:   Glucose, Bld 117 (*)    AST 47 (*)    All other components within normal limits  CBC - Abnormal; Notable for the following components:   RDW 15.6 (*)    All other components within normal limits  SARS CORONAVIRUS 2 BY RT PCR (HOSPITAL ORDER, PERFORMED IN [facility])  PROTIME-INR  APTT  URINALYSIS, ROUTINE W REFLEX MICROSCOPIC    EKG EKG Interpretation  Date/Time:  Wednesday November 01 2019 19:45:43 EDT Ventricular Rate:  81 PR Interval:  156 QRS Duration: 102 QT Interval:  404 QTC Calculation: 469 R Axis:   -51 Text Interpretation: Normal sinus rhythm Left axis deviation Pulmonary disease pattern Incomplete  right bundle branch block Left ventricular hypertrophy with repolarization abnormality ( R in aVL , Cornell product ) Abnormal ECG No significant change since last tracing Confirmed by Susy Frizzle [phone]) on 11/01/2019 7:52:45 PM   Radiology CT Angio Chest PE W/Cm &/Or Wo Cm  Result Date: 11/01/2019 CLINICAL DATA:  Back pain for 2 days with shortness of breath EXAM: CT ANGIOGRAPHY CHEST CT ABDOMEN AND PELVIS WITH CONTRAST TECHNIQUE: Multidetector CT imaging of the chest was performed using the standard protocol during bolus administration of intravenous contrast. Multiplanar CT image reconstructions and MIPs were obtained to evaluate the vascular anatomy. Multidetector CT imaging of the abdomen and pelvis was performed using the standard protocol during bolus administration of intravenous contrast. CONTRAST:  OMNIPAQUE IOHEXOL 350 MG/ML SOLN COMPARISON:  08/28/2019 FINDINGS: CTA CHEST FINDINGS Cardiovascular: Atherosclerotic calcifications of the thoracic aorta are noted. Aortic stent graft is noted involving the aortic arch and descending aorta. No findings to suggest acute dissection are seen. Coronary calcifications are noted. No cardiac enlargement is noted. Pulmonary artery shows a normal branching pattern. No filling defect to suggest pulmonary embolism is seen. Mediastinum/Nodes: Thoracic inlet is within normal limits. No hilar or mediastinal adenopathy is noted. Scattered calcified hilar lymph nodes are seen consistent with prior granulomatous disease. The esophagus as visualized is within normal limits. Lungs/Pleura: Lungs are well aerated bilaterally. Mild emphysematous changes are seen. Bibasilar scarring is noted. Changes of calcified granulomas are seen. Musculoskeletal: Degenerative changes of the thoracic spine are seen. No compression deformity is noted. No acute rib abnormality is noted. Review of the MIP images confirms the above findings. CT ABDOMEN and PELVIS FINDINGS Hepatobiliary:  Liver is within normal limits. Scattered gallstones are noted within the gallbladder without complicating factors. Pancreas: Pancreas is well visualized within normal enhancement pattern. Considerable peripancreatic inflammatory  changes noted extending superiorly towards the spleen as well as inferiorly along Gerota's fascia and the left pericolic gutter. Similar changes are noted extending along Gerota's fascia on the right as well. Spleen: Multiple calcified granulomas are noted. Adrenals/Urinary Tract: Right adrenal gland is within normal limits. Left adrenal gland demonstrates a focal nodular lesion consistent with adenoma stable from the prior exam. Kidneys demonstrate bilateral cystic change. Nonobstructing left lower pole renal stone is seen. The ureters are within normal limits. The bladder is within normal limits. Stomach/Bowel: Scattered diverticular change of the colon is noted. No evidence of diverticulitis is seen. The appendix is not well visualized. No inflammatory changes to suggest appendicitis are seen. Stomach is unremarkable. No small bowel abnormality is seen. Vascular/Lymphatic: Atherosclerotic changes are noted in the infrarenal aorta with mild dilatation to 3.2 cm stable in appearance from the prior exam. 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 Reproductive: Status post hysterectomy. No adnexal masses. Other: No abdominal wall hernia or abnormality. No abdominopelvic ascites. Musculoskeletal: Degenerative changes of the lumbar spine are noted. Changes consistent with prior vertebral augmentation at L3 and L4 is seen. Review of the MIP images confirms the above findings. IMPRESSION: CTA of the chest: No evidence of pulmonary emboli. Thoracic aortic stent graft is again seen without complicating factors. Changes of prior granulomatous disease. Emphysematous changes. CT of the abdomen and pelvis:  Changes consistent with acute pancreatitis without evidence of pancreatic necrosis. Diverticulosis without diverticulitis. Cholelithiasis. 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 Dilatation of the infrarenal aorta is again noted a 3.2 cm. Recommend followup by ultrasound in 3 years. This recommendation follows ACR consensus guidelines: White Paper of the ACR Incidental Findings Committee II on Vascular Findings. [facility] Coll Radiol 2013; 10:789-794 Electronically Signed   By: Alcide Clever M.D.   On: 11/01/2019 22:33   CT Abdomen Pelvis W Contrast  Result Date: 11/01/2019 CLINICAL DATA:  Back pain for 2 days with shortness of breath EXAM: CT ANGIOGRAPHY CHEST CT ABDOMEN AND PELVIS WITH CONTRAST TECHNIQUE: Multidetector CT imaging of the chest was performed using the standard protocol during bolus administration of intravenous contrast. Multiplanar CT image reconstructions and MIPs were obtained to evaluate the vascular anatomy. Multidetector CT imaging of the abdomen and pelvis was performed using the standard protocol during bolus administration of intravenous contrast. CONTRAST:  OMNIPAQUE IOHEXOL 350 MG/ML SOLN COMPARISON:  08/28/2019 FINDINGS: CTA CHEST FINDINGS Cardiovascular: Atherosclerotic calcifications of the thoracic aorta are noted. Aortic stent graft is noted involving the aortic arch and descending aorta. No findings to suggest acute dissection are seen. Coronary calcifications are noted. No cardiac enlargement is noted. Pulmonary artery shows a normal branching pattern. No filling defect to suggest pulmonary embolism is seen. Mediastinum/Nodes: Thoracic inlet is within normal limits. No hilar or mediastinal adenopathy is noted. Scattered calcified hilar lymph nodes are seen consistent with prior granulomatous disease. The esophagus as visualized is within normal limits. Lungs/Pleura: Lungs are well aerated bilaterally. Mild emphysematous changes are seen.  Bibasilar scarring is noted. Changes of calcified granulomas are seen. Musculoskeletal: Degenerative changes of the thoracic spine are seen. No compression deformity is noted. No acute rib abnormality is noted. Review of the MIP images confirms the above findings. CT ABDOMEN and PELVIS FINDINGS Hepatobiliary: Liver is within normal limits. Scattered gallstones are noted within the gallbladder without complicating factors. Pancreas: Pancreas is well visualized within  normal enhancement pattern. Considerable peripancreatic inflammatory changes noted extending superiorly towards the spleen as well as inferiorly along Gerota's fascia and the left pericolic gutter. Similar changes are noted extending along Gerota's fascia on the right as well. Spleen: Multiple calcified granulomas are noted. Adrenals/Urinary Tract: Right adrenal gland is within normal limits. Left adrenal gland demonstrates a focal nodular lesion consistent with adenoma stable from the prior exam. Kidneys demonstrate bilateral cystic change. Nonobstructing left lower pole renal stone is seen. The ureters are within normal limits. The bladder is within normal limits. Stomach/Bowel: Scattered diverticular change of the colon is noted. No evidence of diverticulitis is seen. The appendix is not well visualized. No inflammatory changes to suggest appendicitis are seen. Stomach is unremarkable. No small bowel abnormality is seen. Vascular/Lymphatic: Atherosclerotic changes are noted in the infrarenal aorta with mild dilatation to 3.2 cm stable in appearance from the prior exam. 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 Reproductive: Status post hysterectomy. No adnexal masses. Other: No abdominal wall hernia or abnormality. No abdominopelvic ascites. Musculoskeletal: Degenerative changes of the lumbar spine are noted. Changes consistent with prior vertebral  augmentation at L3 and L4 is seen. Review of the MIP images confirms the above findings. IMPRESSION: CTA of the chest: No evidence of pulmonary emboli. Thoracic aortic stent graft is again seen without complicating factors. Changes of prior granulomatous disease. Emphysematous changes. CT of the abdomen and pelvis: Changes consistent with acute pancreatitis without evidence of pancreatic necrosis. Diverticulosis without diverticulitis. Cholelithiasis. 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 Dilatation of the infrarenal aorta is again noted a 3.2 cm. Recommend followup by ultrasound in 3 years. This recommendation follows ACR consensus guidelines: White Paper of the ACR Incidental Findings Committee II on Vascular Findings. [facility] Coll Radiol 2013; 10:789-794 Electronically Signed   By: Inez Catalina M.D.   On: 11/01/2019 22:33    Procedures .Critical Care Performed by: Truddie Hidden, MD Authorized by: Truddie Hidden, MD   Critical care provider statement:    Critical care time (minutes):  40   Critical care was necessary to treat or prevent imminent or life-threatening deterioration of the following conditions:  Respiratory failure   Critical care was time spent personally by me on the following activities:  Development of treatment plan with patient or surrogate, evaluation of patient's response to treatment, examination of patient, discussions with consultants, ordering and performing treatments and interventions, ordering and review of laboratory studies, ordering and review of radiographic studies, re-evaluation of patient's condition, review of old charts and pulse oximetry   (including critical care time)  Medications Ordered in ED Medications  iohexol (OMNIPAQUE) 350 MG/ML injection 100 mL (100 mLs Intravenous Contrast Given 11/01/19 2207)    ED Course  I have reviewed the triage vital signs and the nursing notes.  Pertinent labs & imaging  results that were available during my care of the patient were reviewed by me and considered in my medical decision making (see chart for details).  Clinical Course as of Oct 31 2299  Wed Nov 01, 2019  2237 Labs show elevated lipase and CT consistent with acute pancreatitis, unclear etiology. Also not sure why she would be hypoxic. No PE or pulmonary process seen. Will check Covid, plan admission for bowel rest and pain control.    [CS]  2259 Discussed with Dr. Humphrey Rolls who will evaluate for admission.    [  CS]    Clinical Course User Index [CS] Pollyann Savoy, MD   MDM Rules/Calculators/A&P                      Patient with upper abdominal pain and hypoxia of unclear etiology. Consider PNA, PE or Covid. Will check labs, CTA and reassess. Patient reports pain and nausea well controlled at the time of my evaluation.  Final Clinical Impression(s) / ED Diagnoses Final diagnoses:  Acute pancreatitis without infection or necrosis, unspecified pancreatitis type  Acute respiratory failure with hypoxia [facility])    Rx / DC Orders ED Discharge Orders    None       Pollyann Savoy, MD 11/01/19 2301

## 2019-11-01 NOTE — ED Triage Notes (Signed)
Pt arrived by [facility] for upper abdominal pain and back pain that started lastnight. Denies falling or any injury. Pt on RA 75%, pt states she doesn't use oxygen at home.

## 2019-11-02 ENCOUNTER — Inpatient Hospital Stay (HOSPITAL_COMMUNITY): Payer: Medicare HMO

## 2019-11-02 DIAGNOSIS — R1010 Upper abdominal pain, unspecified: Secondary | ICD-10-CM | POA: Diagnosis present

## 2019-11-02 DIAGNOSIS — G9341 Metabolic encephalopathy: Secondary | ICD-10-CM | POA: Diagnosis present

## 2019-11-02 DIAGNOSIS — R5381 Other malaise: Secondary | ICD-10-CM | POA: Diagnosis not present

## 2019-11-02 DIAGNOSIS — Z20822 Contact with and (suspected) exposure to covid-19: Secondary | ICD-10-CM | POA: Diagnosis present

## 2019-11-02 DIAGNOSIS — E785 Hyperlipidemia, unspecified: Secondary | ICD-10-CM | POA: Diagnosis present

## 2019-11-02 DIAGNOSIS — I714 Abdominal aortic aneurysm, without rupture: Secondary | ICD-10-CM | POA: Diagnosis present

## 2019-11-02 DIAGNOSIS — Z515 Encounter for palliative care: Secondary | ICD-10-CM | POA: Diagnosis not present

## 2019-11-02 DIAGNOSIS — J449 Chronic obstructive pulmonary disease, unspecified: Secondary | ICD-10-CM | POA: Diagnosis present

## 2019-11-02 DIAGNOSIS — I712 Thoracic aortic aneurysm, without rupture: Secondary | ICD-10-CM | POA: Diagnosis present

## 2019-11-02 DIAGNOSIS — J9602 Acute respiratory failure with hypercapnia: Secondary | ICD-10-CM | POA: Diagnosis present

## 2019-11-02 DIAGNOSIS — G4700 Insomnia, unspecified: Secondary | ICD-10-CM | POA: Diagnosis present

## 2019-11-02 DIAGNOSIS — I5033 Acute on chronic diastolic (congestive) heart failure: Secondary | ICD-10-CM | POA: Diagnosis present

## 2019-11-02 DIAGNOSIS — K851 Biliary acute pancreatitis without necrosis or infection: Secondary | ICD-10-CM | POA: Diagnosis present

## 2019-11-02 DIAGNOSIS — I110 Hypertensive heart disease with heart failure: Secondary | ICD-10-CM | POA: Diagnosis present

## 2019-11-02 DIAGNOSIS — F419 Anxiety disorder, unspecified: Secondary | ICD-10-CM | POA: Diagnosis present

## 2019-11-02 DIAGNOSIS — G20 Parkinson's disease: Secondary | ICD-10-CM | POA: Diagnosis present

## 2019-11-02 DIAGNOSIS — Z8249 Family history of ischemic heart disease and other diseases of the circulatory system: Secondary | ICD-10-CM | POA: Diagnosis not present

## 2019-11-02 DIAGNOSIS — K859 Acute pancreatitis without necrosis or infection, unspecified: Secondary | ICD-10-CM

## 2019-11-02 DIAGNOSIS — J9601 Acute respiratory failure with hypoxia: Secondary | ICD-10-CM | POA: Diagnosis present

## 2019-11-02 DIAGNOSIS — I739 Peripheral vascular disease, unspecified: Secondary | ICD-10-CM | POA: Diagnosis present

## 2019-11-02 DIAGNOSIS — Z811 Family history of alcohol abuse and dependence: Secondary | ICD-10-CM | POA: Diagnosis not present

## 2019-11-02 DIAGNOSIS — G2581 Restless legs syndrome: Secondary | ICD-10-CM | POA: Diagnosis present

## 2019-11-02 DIAGNOSIS — Z87891 Personal history of nicotine dependence: Secondary | ICD-10-CM | POA: Diagnosis not present

## 2019-11-02 DIAGNOSIS — I248 Other forms of acute ischemic heart disease: Secondary | ICD-10-CM | POA: Diagnosis not present

## 2019-11-02 DIAGNOSIS — K8020 Calculus of gallbladder without cholecystitis without obstruction: Secondary | ICD-10-CM | POA: Diagnosis present

## 2019-11-02 DIAGNOSIS — Z66 Do not resuscitate: Secondary | ICD-10-CM | POA: Diagnosis not present

## 2019-11-02 DIAGNOSIS — Z7189 Other specified counseling: Secondary | ICD-10-CM | POA: Diagnosis not present

## 2019-11-02 DIAGNOSIS — Z825 Family history of asthma and other chronic lower respiratory diseases: Secondary | ICD-10-CM | POA: Diagnosis not present

## 2019-11-02 DIAGNOSIS — R109 Unspecified abdominal pain: Secondary | ICD-10-CM | POA: Diagnosis present

## 2019-11-02 DIAGNOSIS — K219 Gastro-esophageal reflux disease without esophagitis: Secondary | ICD-10-CM | POA: Diagnosis present

## 2019-11-02 LAB — URINALYSIS, ROUTINE W REFLEX MICROSCOPIC
Bacteria, UA: NONE SEEN
Bilirubin Urine: NEGATIVE
Glucose, UA: NEGATIVE mg/dL
Hgb urine dipstick: NEGATIVE
Ketones, ur: NEGATIVE mg/dL
Leukocytes,Ua: NEGATIVE
Nitrite: NEGATIVE
Protein, ur: 30 mg/dL — AB
Specific Gravity, Urine: 1.028 (ref 1.005–1.030)
pH: 6 (ref 5.0–8.0)

## 2019-11-02 LAB — CBC
HCT: 41 % (ref 36.0–46.0)
Hemoglobin: 12.5 g/dL (ref 12.0–15.0)
MCH: 30.6 pg (ref 26.0–34.0)
MCHC: 30.5 g/dL (ref 30.0–36.0)
MCV: 100.5 fL — ABNORMAL HIGH (ref 80.0–100.0)
Platelets: 135 10*3/uL — ABNORMAL LOW (ref 150–400)
RBC: 4.08 MIL/uL (ref 3.87–5.11)
RDW: 15.7 % — ABNORMAL HIGH (ref 11.5–15.5)
WBC: 9.7 10*3/uL (ref 4.0–10.5)
nRBC: 0 % (ref 0.0–0.2)

## 2019-11-02 LAB — BASIC METABOLIC PANEL
Anion gap: 11 (ref 5–15)
BUN: 17 mg/dL (ref 8–23)
CO2: 29 mmol/L (ref 22–32)
Calcium: 8.8 mg/dL — ABNORMAL LOW (ref 8.9–10.3)
Chloride: 98 mmol/L (ref 98–111)
Creatinine, Ser: 0.62 mg/dL (ref 0.44–1.00)
GFR calc Af Amer: >60 mL/min (ref >60)
GFR calc non Af Amer: >60 mL/min (ref >60)
Glucose, Bld: 117 mg/dL — ABNORMAL HIGH (ref 70–99)
Potassium: 4 mmol/L (ref 3.5–5.1)
Sodium: 138 mmol/L (ref 135–145)

## 2019-11-02 LAB — SARS CORONAVIRUS 2 BY RT PCR (HOSPITAL ORDER, PERFORMED IN ~~LOC~~ HOSPITAL LAB): SARS Coronavirus 2: NEGATIVE

## 2019-11-02 MED ORDER — CARBIDOPA-LEVODOPA 25-100 MG PO TABS
1.0000 | ORAL_TABLET | Freq: Every day | ORAL | Status: DC
  Administered 2019-11-02 – 2019-11-07 (×6): 1 via ORAL
  Filled 2019-11-02 (×8): qty 1

## 2019-11-02 MED ORDER — HYDROMORPHONE HCL 1 MG/ML IJ SOLN
1.0000 mg | INTRAMUSCULAR | Status: DC | PRN
  Administered 2019-11-02 – 2019-11-07 (×15): 1 mg via INTRAVENOUS
  Filled 2019-11-02 (×15): qty 1

## 2019-11-02 MED ORDER — ROPINIROLE HCL 1 MG PO TABS
1.0000 mg | ORAL_TABLET | Freq: Once | ORAL | Status: AC
  Administered 2019-11-02: 1 mg via ORAL
  Filled 2019-11-02 (×2): qty 1

## 2019-11-02 MED ORDER — ROPINIROLE HCL 0.25 MG PO TABS
0.5000 mg | ORAL_TABLET | Freq: Every morning | ORAL | Status: DC
  Administered 2019-11-03 – 2019-11-07 (×4): 0.5 mg via ORAL
  Filled 2019-11-02 (×7): qty 2

## 2019-11-02 MED ORDER — ROPINIROLE HCL 1 MG PO TABS
1.0000 mg | ORAL_TABLET | Freq: Every day | ORAL | Status: DC
  Administered 2019-11-02 – 2019-11-06 (×5): 1 mg via ORAL
  Filled 2019-11-02 (×10): qty 1

## 2019-11-02 MED ORDER — ENOXAPARIN SODIUM 40 MG/0.4ML ~~LOC~~ SOLN
40.0000 mg | SUBCUTANEOUS | Status: DC
  Administered 2019-11-02 – 2019-11-07 (×6): 40 mg via SUBCUTANEOUS
  Filled 2019-11-02 (×6): qty 0.4

## 2019-11-02 MED ORDER — FAMOTIDINE 20 MG PO TABS
20.0000 mg | ORAL_TABLET | Freq: Every day | ORAL | Status: DC
  Administered 2019-11-02 – 2019-11-07 (×6): 20 mg via ORAL
  Filled 2019-11-02 (×6): qty 1

## 2019-11-02 MED ORDER — ACETAMINOPHEN 325 MG PO TABS
650.0000 mg | ORAL_TABLET | Freq: Four times a day (QID) | ORAL | Status: DC | PRN
  Administered 2019-11-05: 650 mg via ORAL
  Filled 2019-11-02: qty 2

## 2019-11-02 MED ORDER — ACETAMINOPHEN 650 MG RE SUPP: 650.0000 mg | Freq: Four times a day (QID) | RECTAL | Status: DC | PRN

## 2019-11-02 MED ORDER — ONDANSETRON HCL 4 MG PO TABS: 4.0000 mg | ORAL_TABLET | Freq: Four times a day (QID) | ORAL | Status: DC | PRN

## 2019-11-02 MED ORDER — ATENOLOL 25 MG PO TABS
25.0000 mg | ORAL_TABLET | Freq: Two times a day (BID) | ORAL | Status: DC
  Administered 2019-11-02 – 2019-11-04 (×4): 25 mg via ORAL
  Filled 2019-11-02 (×4): qty 1

## 2019-11-02 MED ORDER — ONDANSETRON HCL 4 MG/2ML IJ SOLN
4.0000 mg | Freq: Four times a day (QID) | INTRAMUSCULAR | Status: DC | PRN
  Administered 2019-11-03: 4 mg via INTRAVENOUS
  Filled 2019-11-02: qty 2

## 2019-11-02 MED ORDER — MORPHINE SULFATE (PF) 2 MG/ML IV SOLN: 2.0000 mg | INTRAVENOUS | Status: DC | PRN

## 2019-11-02 MED ORDER — PAROXETINE HCL 20 MG PO TABS
20.0000 mg | ORAL_TABLET | Freq: Every day | ORAL | Status: DC
  Administered 2019-11-02 – 2019-11-07 (×6): 20 mg via ORAL
  Filled 2019-11-02 (×8): qty 1

## 2019-11-02 MED ORDER — ASPIRIN EC 81 MG PO TBEC
81.0000 mg | DELAYED_RELEASE_TABLET | Freq: Every day | ORAL | Status: DC
  Administered 2019-11-03 – 2019-11-07 (×5): 81 mg via ORAL
  Filled 2019-11-02 (×5): qty 1

## 2019-11-02 MED ORDER — SODIUM CHLORIDE 0.9 % IV SOLN: INTRAVENOUS | Status: DC

## 2019-11-02 NOTE — H&P (Signed)
History and Physical    Briana Dean WSF:[id number] DOB: 1940-09-17 DOA: 11/01/2019  PCP: Jason Coop, FNP (Confirm with patient/family/NH records and if not entered, this has to be entered at [facility] point of entry) Patient coming from: Home   I have personally briefly reviewed patient's old medical records in [facility]  Chief Complaint: Abdominal pain  HPI: Briana Dean is a 79 y.o. female with medical history significant of hypertension, hyperlipidemia, COPD, heart failure, depression and restless leg syndrome presented to ED for evaluation of upper abdominal pain.  Patient states that pain started in her epigastric region last night suddenly after eating dinner and continue to worsen.  Pain is sharp, 8 out of 10 on pain scale, radiating to back and nothing makes it better or worse.  Pain is associated with nausea and vomiting but no constipation or diarrhea.  Patient states that she never had this type of pain in the past.  Patient denies any fever, chills, chest pain, shortness of breath and urinary symptoms.  Patient was also hypoxic on arrival to the ED but denied shortness of breath.  Oxygen saturation dropped to 70% in the ED and she was started on nasal cannula.  Patient denies tobacco, alcohol and illicit drug use.  ED Course: Arrival to the ED patient had blood pressure of 179/84, temperature 98.7, heart rate 86 and oxygen saturation is 98% on 4 L of oxygen with nasal cannula.  Blood work showed hemoglobin 12.8, WBC 9, sodium 139, potassium 3.7, BUN 19, creatinine 0.6, blood glucose 117.  Lipase 884.  CT abdomen/pelvis showed acute pancreatitis.  Acute hypoxic respiratory failure had unclear etiology because patient was not in COPD exacerbation not in acute CHF exacerbation, Covid test was also negative and patient was also negative for pulmonary embolism.  Review of Systems: As per HPI otherwise 10 point review of systems negative.  Unacceptable ROS statements: "10  systems reviewed," "Extensive" (without elaboration).  Acceptable ROS statements: "All others negative," "All others reviewed and are negative," and "All others unremarkable," with at LEAST ONE ROS documented Can't double dip - if using for HPI can't use for ROS  Past Medical History:  Diagnosis Date  . AAA (abdominal aortic aneurysm) (HCC)   . Acute on chronic respiratory failure with hypoxia (HCC)   . Anemia, unspecified   . Aneurysm of thoracic aorta (HCC)   . Anxiety disorder   . Chronic combined systolic and diastolic heart failure (HCC)   . Constipation   . COPD (chronic obstructive pulmonary disease) (HCC)   . Dissection of distal aorta (HCC)   . Gastroesophageal reflux disease without esophagitis   . Hyperlipidemia, unspecified   . Hypertension, essential   . Insomnia, unspecified   . Low back pain   . Major depressive disorder, single episode, unspecified   . PAD (peripheral artery disease) (HCC)   . Respiratory failure (HCC)   . Restless leg syndrome   . Unspecified protein-calorie malnutrition (HCC)   . Weakness     Past Surgical History:  Procedure Laterality Date  . IR KYPHO EA ADDL LEVEL THORACIC OR LUMBAR  03/25/2018  . IR KYPHO LUMBAR INC FX REDUCE BONE BX UNI/BIL CANNULATION INC/IMAGING  03/25/2018  . TEE WITHOUT CARDIOVERSION N/A 10/26/2018   Procedure: TRANSESOPHAGEAL ECHOCARDIOGRAM (TEE);  Surgeon: Antoine Poche, MD;  Location: [facility];  Service: Endoscopy;  Laterality: N/A;  . THORACIC AORTIC ANEURYSM REPAIR  09/06/2016   TEVAR  . TRACHEOSTOMY  08/2016  .  VIDEO ASSISTED THORACOSCOPY (VATS)/THOROCOTOMY  08/2016     reports that she quit smoking about 3 years ago. Her smoking use included cigarettes. She has a 60.00 pack-year smoking history. She has never used smokeless tobacco. She reports that she does not drink alcohol or use drugs.  Allergies  Allergen Reactions  . Aspirin Other (See Comments)    Stomach pain  . Penicillins     Has  patient had a PCN reaction causing immediate rash, facial/tongue/throat swelling, SOB or lightheadedness with hypotension No Has patient had a PCN reaction causing severe rash involving mucus membranes or skin necrosis: No Has patient had a PCN reaction that required hospitalization: No Has patient had a PCN reaction occurring within the last 10 years: No If all of the above answers are "NO", then may proceed with Cephalosporin use.     Family History  Problem Relation Age of Onset  . Congenital heart disease Mother   . Heart disease Mother   . Peripheral vascular disease Father   . Alcoholism Father   . Pectus carinatum Son   . Heart failure Son   . Heart attack Son 43  . COPD Sister     Unacceptable: Noncontributory, unremarkable, or negative. Acceptable: (example)Family history negative for heart disease  Prior to Admission medications   Medication Sig Start Date End Date Taking? Authorizing Provider  albuterol (PROVENTIL) (2.5 MG/3ML) 0.083% nebulizer solution Take 3 mLs by nebulization every 4 (four) hours as needed for wheezing or shortness of breath. 10/28/18   Shon Hale, MD  aspirin EC 81 MG tablet Take 1 tablet (81 mg total) by mouth daily with breakfast. 10/28/18   Mariea Clonts, Courage, MD  atenolol (TENORMIN) 25 MG tablet Take 1 tablet (25 mg total) by mouth 2 (two) times daily. 10/28/18   Shon Hale, MD  atorvastatin (LIPITOR) 40 MG tablet Take 40 mg by mouth daily.    [provider]  carbidopa-levodopa (SINEMET IR) 25-100 MG tablet Take 1 tablet by mouth daily.    [provider]  ergocalciferol (VITAMIN D2) 1.25 MG (50000 UT) capsule Take 1 capsule (50,000 Units total) by mouth every Friday. 10/28/18   Shon Hale, MD  famotidine (PEPCID) 20 MG tablet Take 20 mg by mouth daily.    [provider]  ferrous sulfate 324 (65 Fe) MG TBEC Take by mouth daily.    [provider]  furosemide (LASIX) 20 MG tablet Take 20 mg by mouth  daily.    [provider]  lisinopril (ZESTRIL) 20 MG tablet  06/23/19   [provider]  mometasone-formoterol (DULERA) 100-5 MCG/ACT AERO Inhale 2 puffs into the lungs 2 (two) times daily. 10/28/18   Shon Hale, MD  Nutritional Supplements (RA MELATONIN/B-6 PO) Take 1 tablet by mouth at bedtime.    [provider]  ondansetron (ZOFRAN) 4 MG tablet Take 1 tablet (4 mg total) by mouth every 6 (six) hours as needed for nausea. 10/28/18   Shon Hale, MD  PARoxetine (PAXIL) 20 MG tablet Take 20 mg by mouth daily.    [provider]  polyethylene glycol (MIRALAX / GLYCOLAX) 17 g packet Take 17 g by mouth daily. 10/28/18   Shon Hale, MD  potassium chloride (KLOR-CON M15) 15 MEQ tablet Take 2 tablets (30 mEq total) by mouth 2 (two) times daily for 10 days. 10/28/18 11/07/18  Shon Hale, MD  rOPINIRole (REQUIP) 0.5 MG tablet Take 1 tablet (0.5 mg total) by mouth every morning. 10/29/18   Shon Hale, MD  rOPINIRole (REQUIP) 1 MG tablet Take 1 tablet (1 mg total) by mouth at bedtime. 10/28/18   Shon Hale, MD  traMADol (ULTRAM) 50 MG tablet Take 1 tablet (50 mg total) by mouth every 12 (twelve) hours as needed for moderate pain. 10/28/18   Shon Hale, MD    Physical Exam: Vitals:   11/01/19 1942 11/01/19 2300 11/02/19 0000 11/02/19 0130  BP:  (!) 150/78 (!) 175/79 (!) 161/82  Pulse:  85 78 86  Resp:      Temp:      TempSrc:      SpO2: 98% 100% 100% 100%  Weight:      Height:        Constitutional: NAD, calm, comfortable Vitals:   11/01/19 1942 11/01/19 2300 11/02/19 0000 11/02/19 0130  BP:  (!) 150/78 (!) 175/79 (!) 161/82  Pulse:  85 78 86  Resp:      Temp:      TempSrc:      SpO2: 98% 100% 100% 100%  Weight:      Height:        General: Patient is a pleasant 79 year old Caucasian female in no acute distress. Eyes: PERRL, lids and conjunctivae normal ENMT: Mucous membranes are moist. Posterior pharynx clear of any  exudate or lesions.Normal dentition.  Neck: normal, supple, no masses, no thyromegaly Respiratory: Patient is on 3 L of oxygen at this time.  Clear to auscultation bilaterally, no wheezing, no crackles. Normal respiratory effort. No accessory muscle use.  Cardiovascular: Regular rate and rhythm, no murmurs / rubs / gallops. No extremity edema. 2+ pedal pulses. No carotid bruits.  Abdomen: Soft nondistended but severe tenderness in epigastric region on palpation. No masses palpated. No hepatosplenomegaly. Bowel sounds positive.  Musculoskeletal: no clubbing / cyanosis. No joint deformity upper and lower extremities. Good ROM, no contractures. Normal muscle tone.  Skin: no rashes, lesions, ulcers. No induration Neurologic: CN 2-12 grossly intact. Sensation intact, DTR normal. Strength 5/5 in all 4.  Psychiatric: Normal judgment and insight. Alert and oriented x 3. Normal mood.   (Anything < 9 systems with 2 bullets each down codes to level 1) (If patient refuses exam can't bill higher level) (Make sure to document decubitus ulcers present on admission -- if possible -- and whether patient has chronic indwelling catheter at time of admission)  Labs on Admission: I have personally reviewed following labs and imaging studies  CBC: Recent Labs  Lab 11/01/19 2036 11/02/19 0409  WBC 9.0 9.7  HGB 12.8 12.5  HCT 40.9 41.0  MCV 98.1 100.5*  PLT 180 135*   Basic Metabolic Panel: Recent Labs  Lab 11/01/19 2036 11/02/19 0409  NA 139 138  K 3.7 4.0  CL 99 98  CO2 30 29  GLUCOSE 117* 117*  BUN 19 17  CREATININE 0.62 0.62  CALCIUM 9.4 8.8*   GFR: Estimated Creatinine Clearance: 66.4 mL/min (by C-G formula based on SCr of 0.62 mg/dL). Liver Function Tests: Recent Labs  Lab 11/01/19 2036  AST 47*  ALT 10  ALKPHOS 97  BILITOT 0.8  PROT 7.1  ALBUMIN 3.9   Recent Labs  Lab 11/01/19 2036  LIPASE 884*   No results for input(s): AMMONIA in the last 168 hours. Coagulation  Profile: Recent Labs  Lab 11/01/19 2038  INR 1.0   Cardiac Enzymes: No results for input(s): CKTOTAL, CKMB, CKMBINDEX, TROPONINI in the last 168 hours. BNP (last 3 results) No results for input(s): PROBNP in the last 8760 hours. HbA1C: No  results for input(s): HGBA1C in the last 72 hours. CBG: No results for input(s): GLUCAP in the last 168 hours. Lipid Profile: No results for input(s): CHOL, HDL, LDLCALC, TRIG, CHOLHDL, LDLDIRECT in the last 72 hours. Thyroid Function Tests: No results for input(s): TSH, T4TOTAL, FREET4, T3FREE, THYROIDAB in the last 72 hours. Anemia Panel: No results for input(s): VITAMINB12, FOLATE, FERRITIN, TIBC, IRON, RETICCTPCT in the last 72 hours. Urine analysis:    Component Value Date/Time   COLORURINE YELLOW 10/22/2018 1425   APPEARANCEUR HAZY (A) 10/22/2018 1425   LABSPEC 1.008 10/22/2018 1425   PHURINE 8.0 10/22/2018 1425   GLUCOSEU NEGATIVE 10/22/2018 1425   [place] 10/22/2018 [place] 10/22/2018 [place] 10/22/2018 [place] 10/22/2018 1425   NITRITE NEGATIVE 10/22/2018 [place] 10/22/2018 1425    Radiological Exams on Admission: CT Angio Chest PE W/Cm &/Or Wo Cm  Result Date: 11/01/2019 CLINICAL DATA:  Back pain for 2 days with shortness of breath EXAM: CT ANGIOGRAPHY CHEST CT ABDOMEN AND PELVIS WITH CONTRAST TECHNIQUE: Multidetector CT imaging of the chest was performed using the standard protocol during bolus administration of intravenous contrast. Multiplanar CT image reconstructions and MIPs were obtained to evaluate the vascular anatomy. Multidetector CT imaging of the abdomen and pelvis was performed using the standard protocol during bolus administration of intravenous contrast. CONTRAST:  185mL OMNIPAQUE IOHEXOL 350 MG/ML SOLN COMPARISON:  08/28/2019 FINDINGS: CTA CHEST FINDINGS Cardiovascular: Atherosclerotic calcifications of the thoracic aorta are noted.  Aortic stent graft is noted involving the aortic arch and descending aorta. No findings to suggest acute dissection are seen. Coronary calcifications are noted. No cardiac enlargement is noted. Pulmonary artery shows a normal branching pattern. No filling defect to suggest pulmonary embolism is seen. Mediastinum/Nodes: Thoracic inlet is within normal limits. No hilar or mediastinal adenopathy is noted. Scattered calcified hilar lymph nodes are seen consistent with prior granulomatous disease. The esophagus as visualized is within normal limits. Lungs/Pleura: Lungs are well aerated bilaterally. Mild emphysematous changes are seen. Bibasilar scarring is noted. Changes of calcified granulomas are seen. Musculoskeletal: Degenerative changes of the thoracic spine are seen. No compression deformity is noted. No acute rib abnormality is noted. Review of the MIP images confirms the above findings. CT ABDOMEN and PELVIS FINDINGS Hepatobiliary: Liver is within normal limits. Scattered gallstones are noted within the gallbladder without complicating factors. Pancreas: Pancreas is well visualized within normal enhancement pattern. Considerable peripancreatic inflammatory changes noted extending superiorly towards the spleen as well as inferiorly along Gerota's fascia and the left pericolic gutter. Similar changes are noted extending along Gerota's fascia on the right as well. Spleen: Multiple calcified granulomas are noted. Adrenals/Urinary Tract: Right adrenal gland is within normal limits. Left adrenal gland demonstrates a focal nodular lesion consistent with adenoma stable from the prior exam. Kidneys demonstrate bilateral cystic change. Nonobstructing left lower pole renal stone is seen. The ureters are within normal limits. The bladder is within normal limits. Stomach/Bowel: Scattered diverticular change of the colon is noted. No evidence of diverticulitis is seen. The appendix is not well visualized. No inflammatory  changes to suggest appendicitis are seen. Stomach is unremarkable. No small bowel abnormality is seen. Vascular/Lymphatic: Atherosclerotic changes are noted in the infrarenal aorta with mild dilatation to 3.2 cm stable in appearance from the prior exam. 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 Reproductive: Status  post hysterectomy. No adnexal masses. Other: No abdominal wall hernia or abnormality. No abdominopelvic ascites. Musculoskeletal: Degenerative changes of the lumbar spine are noted. Changes consistent with prior vertebral augmentation at L3 and L4 is seen. Review of the MIP images confirms the above findings. IMPRESSION: CTA of the chest: No evidence of pulmonary emboli. Thoracic aortic stent graft is again seen without complicating factors. Changes of prior granulomatous disease. Emphysematous changes. CT of the abdomen and pelvis: Changes consistent with acute pancreatitis without evidence of pancreatic necrosis. Diverticulosis without diverticulitis. Cholelithiasis. 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 Dilatation of the infrarenal aorta is again noted a 3.2 cm. Recommend followup by ultrasound in 3 years. This recommendation follows ACR consensus guidelines: White Paper of the ACR Incidental Findings Committee II on Vascular Findings. Alba DestineJ Am Coll Radiol 2013; 10:789-794 Electronically Signed   By: Alcide CleverMark  Lukens M.D.   On: 11/01/2019 22:33   CT Abdomen Pelvis W Contrast  Result Date: 11/01/2019 CLINICAL DATA:  Back pain for 2 days with shortness of breath EXAM: CT ANGIOGRAPHY CHEST CT ABDOMEN AND PELVIS WITH CONTRAST TECHNIQUE: Multidetector CT imaging of the chest was performed using the standard protocol during bolus administration of intravenous contrast. Multiplanar CT image reconstructions and MIPs were obtained to evaluate the vascular anatomy. Multidetector  CT imaging of the abdomen and pelvis was performed using the standard protocol during bolus administration of intravenous contrast. CONTRAST:  100mL OMNIPAQUE IOHEXOL 350 MG/ML SOLN COMPARISON:  08/28/2019 FINDINGS: CTA CHEST FINDINGS Cardiovascular: Atherosclerotic calcifications of the thoracic aorta are noted. Aortic stent graft is noted involving the aortic arch and descending aorta. No findings to suggest acute dissection are seen. Coronary calcifications are noted. No cardiac enlargement is noted. Pulmonary artery shows a normal branching pattern. No filling defect to suggest pulmonary embolism is seen. Mediastinum/Nodes: Thoracic inlet is within normal limits. No hilar or mediastinal adenopathy is noted. Scattered calcified hilar lymph nodes are seen consistent with prior granulomatous disease. The esophagus as visualized is within normal limits. Lungs/Pleura: Lungs are well aerated bilaterally. Mild emphysematous changes are seen. Bibasilar scarring is noted. Changes of calcified granulomas are seen. Musculoskeletal: Degenerative changes of the thoracic spine are seen. No compression deformity is noted. No acute rib abnormality is noted. Review of the MIP images confirms the above findings. CT ABDOMEN and PELVIS FINDINGS Hepatobiliary: Liver is within normal limits. Scattered gallstones are noted within the gallbladder without complicating factors. Pancreas: Pancreas is well visualized within normal enhancement pattern. Considerable peripancreatic inflammatory changes noted extending superiorly towards the spleen as well as inferiorly along Gerota's fascia and the left pericolic gutter. Similar changes are noted extending along Gerota's fascia on the right as well. Spleen: Multiple calcified granulomas are noted. Adrenals/Urinary Tract: Right adrenal gland is within normal limits. Left adrenal gland demonstrates a focal nodular lesion consistent with adenoma stable from the prior exam. Kidneys demonstrate  bilateral cystic change. Nonobstructing left lower pole renal stone is seen. The ureters are within normal limits. The bladder is within normal limits. Stomach/Bowel: Scattered diverticular change of the colon is noted. No evidence of diverticulitis is seen. The appendix is not well visualized. No inflammatory changes to suggest appendicitis are seen. Stomach is unremarkable. No small bowel abnormality is seen. Vascular/Lymphatic: Atherosclerotic changes are noted in the infrarenal aorta with mild dilatation to 3.2 cm stable in appearance from the prior exam. 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 IMA is  occluded at its origin. Reproductive: Status post hysterectomy. No adnexal masses. Other: No abdominal wall hernia or abnormality. No abdominopelvic ascites. Musculoskeletal: Degenerative changes of the lumbar spine are noted. Changes consistent with prior vertebral augmentation at L3 and L4 is seen. Review of the MIP images confirms the above findings. IMPRESSION: CTA of the chest: No evidence of pulmonary emboli. Thoracic aortic stent graft is again seen without complicating factors. Changes of prior granulomatous disease. Emphysematous changes. CT of the abdomen and pelvis: Changes consistent with acute pancreatitis without evidence of pancreatic necrosis. Diverticulosis without diverticulitis. Cholelithiasis. 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 Dilatation of the infrarenal aorta is again noted a 3.2 cm. Recommend followup by ultrasound in 3 years. This recommendation follows ACR consensus guidelines: White Paper of the ACR Incidental Findings Committee II on Vascular Findings. Alba Destine Coll Radiol 2013; 10:789-794 Electronically Signed   By: Alcide Clever M.D.   On: 11/01/2019 22:33      Assessment/Plan Principal Problem:   Acute pancreatitis  Patient had elevated lipase level and CT abdomen also  positive for acute pancreatitis. Patient started on gentle hydration with IV normal saline at the rate of 75 mL/h. IV morphine 2 mg every 4 hours as needed for moderate to severe pain ordered. IV Zofran every 6 hours as needed for nausea and vomiting. N.p.o. at this time. Patient will be started on full liquid diet in the morning and the diet will be advanced depending upon how the patient tolerates.  Active Problems:   Hypoxia Patient is acute hypoxic respiratory failure but no signs and symptoms of acute CHF exacerbation or COPD exacerbation.  Patient also negative for pulmonary embolism and COVID-19 infection.  Continue oxygen supplementation as needed to maintain oxygen saturation above 92%.    Abdominal pain Abdominal pain is secondary to acute pancreatitis. Morphine 2 mg every 4 hours as needed for moderate and severe pain ordered  Restless leg syndrome Patient was complaining of restless legs and was given 1 dose of ropinirole.  Hypertension Patient was hypertensive on arrival to the hospital secondary to abdominal discomfort.  Blood pressure is 130/72 at this time. Patient will be started on home blood pressure medications after medication history is completed.    DVT prophylaxis: Lovenox Code Status: Full code  Consults called: None Admission status: Inpatient/MedSurg   Thalia Party MD [facility] Pager [phone]   If 7PM-7AM, please contact night-coverage [email or web address] Password   11/02/2019, 4:56 AM

## 2019-11-02 NOTE — Progress Notes (Signed)
PROGRESS NOTE    Briana Dean  FXO:[id number] DOB: [birth year] DOA: 11/01/2019 PCP: Jason Coop, FNP      Brief Narrative:  Briana Dean is a 79 y.o. 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  Patient had been in USOH until day of admission, developed sudden onset epigastric pain, became severe, radiated to back.  In the ER, hypertensives, requiring O2, lipase 884 and CT abdomen showed acute pancreatitis.          Assessment & Plan:  Acute pancreatitis -Trend LFTs -Check RUQ [place] -NPO -IV Fluids -IV Dilaudid q4hrs PRN -IV Zofran as needed    COPD -Continue supplemental O2  Chronic diastolic CHF Hypertension Blood pressure still slightly elevated -Continue atenolol -Hold furosemide, lisinopril, atorvastatin, aspirin  History of aortic aneurysm rupture and repair  Restless leg syndrome Parkinsonism -Continue Requip -Continue Sinemet  Anxiety -Continue Paxil       Disposition: The patient was admitted with pancreatitis.   I will discharge when her symptoms are improving.        MDM: This is a no charge note.  For further details, please see H&P by my partner Dr. Welton Flakes from earlier today.  The below labs and imaging reports were reviewed and summarized above.    DVT prophylaxis: Lovenox Code Status: FULL Family Communication:     Consultants:     Procedures:   6/3 US abdomen  Antimicrobials:      Culture data:              Subjective: Still with severe abdominal pain        Objective: Vitals:   11/02/19 1430 11/02/19 1500 11/02/19 1530 11/02/19 1534  BP: (!) 137/59 (!) 147/63 (!) 147/62   Pulse: 80 83 88 90  Resp:  16    Temp:      TempSrc:      SpO2: 100% 100% 100% 100%  Weight:      Height:       No intake or output data in the 24 hours ending 11/02/19 1547 Filed Weights   11/01/19 1938   Weight: 86.2 kg    Examination: The patient was seen and examined.      Data Reviewed: I have personally reviewed following labs and imaging studies:  CBC: Recent Labs  Lab 11/01/19 2036 11/02/19 0409  WBC 9.0 9.7  HGB 12.8 12.5  HCT 40.9 41.0  MCV 98.1 100.5*  PLT 180 135*   Basic Metabolic Panel: Recent Labs  Lab 11/01/19 2036 11/02/19 0409  NA 139 138  K 3.7 4.0  CL 99 98  CO2 30 29  GLUCOSE 117* 117*  BUN 19 17  CREATININE 0.62 0.62  CALCIUM 9.4 8.8*   GFR: Estimated Creatinine Clearance: 66.4 mL/min (by C-G formula based on SCr of 0.62 mg/dL). Liver Function Tests: Recent Labs  Lab 11/01/19 2036  AST 47*  ALT 10  ALKPHOS 97  BILITOT 0.8  PROT 7.1  ALBUMIN 3.9   Recent Labs  Lab 11/01/19 2036  LIPASE 884*   No results for input(s): AMMONIA in the last 168 hours. Coagulation Profile: Recent Labs  Lab 11/01/19 2038  INR 1.0   Cardiac Enzymes: No results for input(s): CKTOTAL, CKMB, CKMBINDEX, TROPONINI in the last 168 hours. BNP (last 3 results) No results for input(s): PROBNP in the last 8760 hours. HbA1C: No results for input(s): HGBA1C in the  last 72 hours. CBG: No results for input(s): GLUCAP in the last 168 hours. Lipid Profile: No results for input(s): CHOL, HDL, LDLCALC, TRIG, CHOLHDL, LDLDIRECT in the last 72 hours. Thyroid Function Tests: No results for input(s): TSH, T4TOTAL, FREET4, T3FREE, THYROIDAB in the last 72 hours. Anemia Panel: No results for input(s): VITAMINB12, FOLATE, FERRITIN, TIBC, IRON, RETICCTPCT in the last 72 hours. Urine analysis:    Component Value Date/Time   COLORURINE YELLOW 10/22/2018 1425   APPEARANCEUR HAZY (A) 10/22/2018 1425   LABSPEC 1.008 10/22/2018 1425   PHURINE 8.0 10/22/2018 1425   GLUCOSEU NEGATIVE 10/22/2018 1425   HGBUR NEGATIVE 10/22/2018 [place] 10/22/2018 1425   KETONESUR NEGATIVE 10/22/2018 1425   PROTEINUR NEGATIVE 10/22/2018 1425   NITRITE NEGATIVE  10/22/2018 1425   LEUKOCYTESUR NEGATIVE 10/22/2018 1425   Sepsis Labs: @LABRCNTIP (procalcitonin:4,lacticacidven:4)  ) Recent Results (from the past 240 hour(s))  SARS Coronavirus 2 by RT PCR (hospital order, performed in [place] hospital lab) Nasopharyngeal Nasopharyngeal Swab     Status: None   Collection Time: 11/01/19 10:38 PM   Specimen: Nasopharyngeal Swab  Result Value Ref Range Status   SARS Coronavirus 2 NEGATIVE NEGATIVE Final    Comment: (NOTE) SARS-CoV-2 target nucleic acids are NOT DETECTED. The SARS-CoV-2 RNA is generally detectable in upper and lower respiratory specimens during the acute phase of infection. The lowest concentration of SARS-CoV-2 viral copies this assay can detect is 250 copies / mL. A negative result does not preclude SARS-CoV-2 infection and should not be used as the sole basis for treatment or other patient management decisions.  A negative result may occur with improper specimen collection / handling, submission of specimen other than nasopharyngeal swab, presence of viral mutation(s) within the areas targeted by this assay, and inadequate number of viral copies (<250 copies / mL). A negative result must be combined with clinical observations, patient history, and epidemiological information. Fact Sheet for Patients:   StrictlyIdeas.no Fact Sheet for Healthcare Providers: BankingDealers.co.za This test is not yet approved or cleared  by the [place] FDA and has been authorized for detection and/or diagnosis of SARS-CoV-2 by FDA under an Emergency Use Authorization (EUA).  This EUA will remain in effect (meaning this test can be used) for the duration of the COVID-19 declaration under Section 564(b)(1) of the Act, 21 U.S.C. section 360bbb-3(b)(1), unless the authorization is terminated or revoked sooner. Performed at [facility], [address], [place], [place] [address]           Radiology Studies: CT Angio Chest PE W/Cm &/Or Wo Cm  Result Date: 11/01/2019 CLINICAL DATA:  Back pain for 2 days with shortness of breath EXAM: CT ANGIOGRAPHY CHEST CT ABDOMEN AND PELVIS WITH CONTRAST TECHNIQUE: Multidetector CT imaging of the chest was performed using the standard protocol during bolus administration of intravenous contrast. Multiplanar CT image reconstructions and MIPs were obtained to evaluate the vascular anatomy. Multidetector CT imaging of the abdomen and pelvis was performed using the standard protocol during bolus administration of intravenous contrast. CONTRAST:  14mL OMNIPAQUE IOHEXOL 350 MG/ML SOLN COMPARISON:  08/28/2019 FINDINGS: CTA CHEST FINDINGS Cardiovascular: Atherosclerotic calcifications of the thoracic aorta are noted. Aortic stent graft is noted involving the aortic arch and descending aorta. No findings to suggest acute dissection are seen. Coronary calcifications are noted. No cardiac enlargement is noted. Pulmonary artery shows a normal branching pattern. No filling defect to suggest pulmonary embolism is seen. Mediastinum/Nodes: Thoracic inlet is within normal limits. No hilar or  mediastinal adenopathy is noted. Scattered calcified hilar lymph nodes are seen consistent with prior granulomatous disease. The esophagus as visualized is within normal limits. Lungs/Pleura: Lungs are well aerated bilaterally. Mild emphysematous changes are seen. Bibasilar scarring is noted. Changes of calcified granulomas are seen. Musculoskeletal: Degenerative changes of the thoracic spine are seen. No compression deformity is noted. No acute rib abnormality is noted. Review of the MIP images confirms the above findings. CT ABDOMEN and PELVIS FINDINGS Hepatobiliary: Liver is within normal limits. Scattered gallstones are noted within the gallbladder without complicating factors. Pancreas: Pancreas is well visualized within normal enhancement pattern. Considerable peripancreatic  inflammatory changes noted extending superiorly towards the spleen as well as inferiorly along Gerota's fascia and the left pericolic gutter. Similar changes are noted extending along Gerota's fascia on the right as well. Spleen: Multiple calcified granulomas are noted. Adrenals/Urinary Tract: Right adrenal gland is within normal limits. Left adrenal gland demonstrates a focal nodular lesion consistent with adenoma stable from the prior exam. Kidneys demonstrate bilateral cystic change. Nonobstructing left lower pole renal stone is seen. The ureters are within normal limits. The bladder is within normal limits. Stomach/Bowel: Scattered diverticular change of the colon is noted. No evidence of diverticulitis is seen. The appendix is not well visualized. No inflammatory changes to suggest appendicitis are seen. Stomach is unremarkable. No small bowel abnormality is seen. Vascular/Lymphatic: Atherosclerotic changes are noted in the infrarenal aorta with mild dilatation to 3.2 cm stable in appearance from the prior exam. 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 Reproductive: Status post hysterectomy. No adnexal masses. Other: No abdominal wall hernia or abnormality. No abdominopelvic ascites. Musculoskeletal: Degenerative changes of the lumbar spine are noted. Changes consistent with prior vertebral augmentation at L3 and L4 is seen. Review of the MIP images confirms the above findings. IMPRESSION: CTA of the chest: No evidence of pulmonary emboli. Thoracic aortic stent graft is again seen without complicating factors. Changes of prior granulomatous disease. Emphysematous changes. CT of the abdomen and pelvis: Changes consistent with acute pancreatitis without evidence of pancreatic necrosis. Diverticulosis without diverticulitis. Cholelithiasis. Chronic appearing aortic dissection is noted distally stable from the prior  exam. Dilatation of the infrarenal aorta is again noted a 3.2 cm. Recommend followup by ultrasound in 3 years. This recommendation follows ACR consensus guidelines: White Paper of the ACR Incidental Findings Committee II on Vascular Findings. [facility] [place] [employer] Radiol 2013; 10:789-794 Electronically Signed   By: Alcide Clever M.D.   On: 11/01/2019 22:33   CT Abdomen Pelvis W Contrast  Result Date: 11/01/2019 CLINICAL DATA:  Back pain for 2 days with shortness of breath EXAM: CT ANGIOGRAPHY CHEST CT ABDOMEN AND PELVIS WITH CONTRAST TECHNIQUE: Multidetector CT imaging of the chest was performed using the standard protocol during bolus administration of intravenous contrast. Multiplanar CT image reconstructions and MIPs were obtained to evaluate the vascular anatomy. Multidetector CT imaging of the abdomen and pelvis was performed using the standard protocol during bolus administration of intravenous contrast. CONTRAST:  OMNIPAQUE IOHEXOL 350 MG/ML SOLN COMPARISON:  08/28/2019 FINDINGS: CTA CHEST FINDINGS Cardiovascular: Atherosclerotic calcifications of the thoracic aorta are noted. Aortic stent graft is noted involving the aortic arch and descending aorta. No findings to suggest acute dissection are seen. Coronary calcifications are noted. No cardiac enlargement is noted. Pulmonary artery shows a normal branching pattern. No filling defect to suggest pulmonary embolism is seen. Mediastinum/Nodes: Thoracic inlet is  within normal limits. No hilar or mediastinal adenopathy is noted. Scattered calcified hilar lymph nodes are seen consistent with prior granulomatous disease. The esophagus as visualized is within normal limits. Lungs/Pleura: Lungs are well aerated bilaterally. Mild emphysematous changes are seen. Bibasilar scarring is noted. Changes of calcified granulomas are seen. Musculoskeletal: Degenerative changes of the thoracic spine are seen. No compression deformity is noted. No acute rib abnormality is noted.  Review of the MIP images confirms the above findings. CT ABDOMEN and PELVIS FINDINGS Hepatobiliary: Liver is within normal limits. Scattered gallstones are noted within the gallbladder without complicating factors. Pancreas: Pancreas is well visualized within normal enhancement pattern. Considerable peripancreatic inflammatory changes noted extending superiorly towards the spleen as well as inferiorly along Gerota's fascia and the left pericolic gutter. Similar changes are noted extending along Gerota's fascia on the right as well. Spleen: Multiple calcified granulomas are noted. Adrenals/Urinary Tract: Right adrenal gland is within normal limits. Left adrenal gland demonstrates a focal nodular lesion consistent with adenoma stable from the prior exam. Kidneys demonstrate bilateral cystic change. Nonobstructing left lower pole renal stone is seen. The ureters are within normal limits. The bladder is within normal limits. Stomach/Bowel: Scattered diverticular change of the colon is noted. No evidence of diverticulitis is seen. The appendix is not well visualized. No inflammatory changes to suggest appendicitis are seen. Stomach is unremarkable. No small bowel abnormality is seen. Vascular/Lymphatic: Atherosclerotic changes are noted in the infrarenal aorta with mild dilatation to 3.2 cm stable in appearance from the prior exam. 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 Reproductive: Status post hysterectomy. No adnexal masses. Other: No abdominal wall hernia or abnormality. No abdominopelvic ascites. Musculoskeletal: Degenerative changes of the lumbar spine are noted. Changes consistent with prior vertebral augmentation at L3 and L4 is seen. Review of the MIP images confirms the above findings. IMPRESSION: CTA of the chest: No evidence of pulmonary emboli. Thoracic aortic stent graft is again seen without complicating  factors. Changes of prior granulomatous disease. Emphysematous changes. CT of the abdomen and pelvis: Changes consistent with acute pancreatitis without evidence of pancreatic necrosis. Diverticulosis without diverticulitis. Cholelithiasis. 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 Dilatation of the infrarenal aorta is again noted a 3.2 cm. Recommend followup by ultrasound in 3 years. This recommendation follows ACR consensus guidelines: White Paper of the ACR Incidental Findings Committee II on Vascular Findings. [facility] [employer] Radiol 2013; 10:789-794 Electronically Signed   By: Alcide Clever M.D.   On: 11/01/2019 22:33        Scheduled Meds:  [START ON 11/03/2019] aspirin EC  81 mg Oral Q breakfast   atenolol  25 mg Oral BID   carbidopa-levodopa  1 tablet Oral Daily   enoxaparin (LOVENOX) injection  40 mg Subcutaneous Q24H   famotidine  20 mg Oral Daily   PARoxetine  20 mg Oral Daily   [START ON 11/03/2019] rOPINIRole  0.5 mg Oral q morning - 10a   rOPINIRole  1 mg Oral QHS   Continuous Infusions:  sodium chloride 150 mL/hr at 11/02/19 1231     LOS: 0 days    Time spent: 20 minutes    Alberteen Sam, MD [facility] 11/02/2019, 3:47 PM     Please page though AMION or Epic secure chat:  For password, contact charge nurse

## 2019-11-02 NOTE — Plan of Care (Signed)

## 2019-11-03 ENCOUNTER — Inpatient Hospital Stay (HOSPITAL_COMMUNITY): Payer: Medicare HMO

## 2019-11-03 LAB — CBC
HCT: 36.3 % (ref 36.0–46.0)
Hemoglobin: 11.2 g/dL — ABNORMAL LOW (ref 12.0–15.0)
MCH: 31.2 pg (ref 26.0–34.0)
MCHC: 30.9 g/dL (ref 30.0–36.0)
MCV: 101.1 fL — ABNORMAL HIGH (ref 80.0–100.0)
Platelets: 132 10*3/uL — ABNORMAL LOW (ref 150–400)
RBC: 3.59 MIL/uL — ABNORMAL LOW (ref 3.87–5.11)
RDW: 15.5 % (ref 11.5–15.5)
WBC: 11.9 10*3/uL — ABNORMAL HIGH (ref 4.0–10.5)
nRBC: 0 % (ref 0.0–0.2)

## 2019-11-03 LAB — COMPREHENSIVE METABOLIC PANEL
ALT: <5 U/L (ref 0–44)
AST: 18 U/L (ref 15–41)
Albumin: 3 g/dL — ABNORMAL LOW (ref 3.5–5.0)
Alkaline Phosphatase: 73 U/L (ref 38–126)
Anion gap: 7 (ref 5–15)
BUN: 14 mg/dL (ref 8–23)
CO2: 31 mmol/L (ref 22–32)
Calcium: 8.6 mg/dL — ABNORMAL LOW (ref 8.9–10.3)
Chloride: 104 mmol/L (ref 98–111)
Creatinine, Ser: 0.49 mg/dL (ref 0.44–1.00)
GFR calc Af Amer: >60 mL/min (ref >60)
GFR calc non Af Amer: >60 mL/min (ref >60)
Glucose, Bld: 91 mg/dL (ref 70–99)
Potassium: 3.7 mmol/L (ref 3.5–5.1)
Sodium: 142 mmol/L (ref 135–145)
Total Bilirubin: 0.9 mg/dL (ref 0.3–1.2)
Total Protein: 5.9 g/dL — ABNORMAL LOW (ref 6.5–8.1)

## 2019-11-03 LAB — LIPASE, BLOOD: Lipase: 33 U/L (ref 11–51)

## 2019-11-03 MED ORDER — GADOBUTROL 1 MMOL/ML IV SOLN
9.0000 mL | Freq: Once | INTRAVENOUS | Status: AC | PRN
  Administered 2019-11-03: 9 mL via INTRAVENOUS

## 2019-11-03 NOTE — Evaluation (Signed)
Physical Therapy Evaluation Patient Details Name: Briana Dean MRN: [id number] DOB: 1941-03-20 Today's Date: 11/03/2019   History of Present Illness  Briana Dean is a 79 y.o. female with medical history significant of hypertension, hyperlipidemia, COPD, heart failure, depression and restless leg syndrome presented to ED for evaluation of upper abdominal pain.  Patient states that pain started in her epigastric region last night suddenly after eating dinner and continue to worsen.  Pain is sharp, 8 out of 10 on pain scale, radiating to back and nothing makes it better or worse.  Pain is associated with nausea and vomiting but no constipation or diarrhea.  Patient states that she never had this type of pain in the past.  Patient denies any fever, chills, chest pain, shortness of breath and urinary symptoms.  Patient was also hypoxic on arrival to the ED but denied shortness of breath.  Oxygen saturation dropped to 70% in the ED and she was started on nasal cannula.  Patient denies tobacco, alcohol and illicit drug use.    Clinical Impression  Patient limited for functional mobility as stated below secondary to BLE weakness, fatigue and poor standing balance. Patient has c/o back and abdominal pain throughout session. Patient stated she received pain meds before session and she appears slightly impaired with slow responses and requires frequent follow up/redirection. She requires min assist to transition to seated EOB. She demonstrates good sitting balance and tolerance today. She requires assist to transfer to standing with RW and is unsteady upon standing requiring assist for balance. She is limited to several, small, unsteady steps at bedside and fatigues quickly.  Patient will benefit from continued physical therapy in hospital and recommended venue below to increase strength, balance, endurance for safe ADLs and gait.     Follow Up Recommendations SNF    Equipment Recommendations  None  recommended by PT    Recommendations for Other Services       Precautions / Restrictions Precautions Precautions: Fall Restrictions Weight Bearing Restrictions: No      Mobility  Bed Mobility Overal bed mobility: Needs Assistance Bed Mobility: Supine to Sit;Sit to Supine     Supine to sit: Min assist Sit to supine: Min assist   General bed mobility comments: to transition to seated EOB  Transfers Overall transfer level: Needs assistance Equipment used: Rolling walker (2 wheeled) Transfers: Sit to/from [employer] Sit to Stand: Mod assist Stand pivot transfers: Mod assist       General transfer comment: with RW and assist  Ambulation/Gait Ambulation/Gait assistance: Mod assist Gait Distance (Feet): 3 Feet Assistive device: Rolling walker (2 wheeled) Gait Pattern/deviations: Decreased step length - right;Decreased step length - left;Decreased stride length Gait velocity: decreased   General Gait Details: slow, labored, unsteady with RW  Stairs            Wheelchair Mobility    Modified Rankin (Stroke Patients Only)       Balance Overall balance assessment: Needs assistance Sitting-balance support: Feet supported Sitting balance-Leahy Scale: Good Sitting balance - Comments: seated EOB   Standing balance support: Bilateral upper extremity supported;During functional activity Standing balance-Leahy Scale: Poor Standing balance comment: with RW                             Pertinent Vitals/Pain Pain Assessment: Faces Faces Pain Scale: Hurts even more Pain Location: back, legs, head Pain Intervention(s): Limited activity within patient's tolerance;Monitored during session;Repositioned  Home Living Family/patient expects to be discharged to:: Private residence Living Arrangements: Other relatives Available Help at Discharge: Family Type of Home: House Home Access: Stairs to enter Entrance Stairs-Rails: Right;Left;Can  reach both Entrance Stairs-Number of Steps: [place]: One level;Laundry or work area in basement;Able to live on main level with bedroom/bathroom Home Equipment: Environmental consultant - 2 wheels;Cane - single point;Shower seat      Prior Function Level of Independence: Needs assistance   Gait / Transfers Assistance Needed: household ambulator with RW  ADL's / Homemaking Assistance Needed: Patient states independent with basic ADL, daughter in law assists        Hand Dominance   Dominant Hand: Right    Extremity/Trunk Assessment   Upper Extremity Assessment Upper Extremity Assessment: Generalized weakness    Lower Extremity Assessment Lower Extremity Assessment: Generalized weakness    Cervical / Trunk Assessment Cervical / Trunk Assessment: Normal  Communication      Cognition Arousal/Alertness: Awake/alert;Suspect due to medications Behavior During Therapy: [facility] for tasks assessed/performed Overall Cognitive Status: Within Functional Limits for tasks assessed                                        General Comments      Exercises     Assessment/Plan    PT Assessment Patient needs continued PT services  PT Problem List Decreased strength;Decreased mobility;Decreased activity tolerance;Decreased balance;Pain       PT Treatment Interventions DME instruction;Therapeutic exercise;Gait training;Balance training;Stair training;Neuromuscular re-education;Functional mobility training;Therapeutic activities;Patient/family education    PT Goals (Current goals can be found in the Care Plan section)  Acute Rehab PT Goals Patient Stated Goal: Return home PT Goal Formulation: With patient Time For Goal Achievement: 11/17/19 Potential to Achieve Goals: Fair    Frequency Min 3X/week   Barriers to discharge        Co-evaluation               AM-PAC PT "6 Clicks" Mobility  Outcome Measure Help needed turning from your back to your side while in a flat  bed without using bedrails?: None Help needed moving from lying on your back to sitting on the side of a flat bed without using bedrails?: A Little Help needed moving to and from a bed to a chair (including a wheelchair)?: A Lot Help needed standing up from a chair using your arms (e.g., wheelchair or bedside chair)?: A Lot Help needed to walk in hospital room?: A Lot Help needed climbing 3-5 steps with a railing? : A Lot 6 Click Score: 15    End of Session Equipment Utilized During Treatment: Gait belt;Oxygen Activity Tolerance: Patient tolerated treatment well Patient left: in bed;with call bell/phone within reach;with bed alarm set Nurse Communication: Mobility status PT Visit Diagnosis: Unsteadiness on feet (R26.81);Other abnormalities of gait and mobility (R26.89);Muscle weakness (generalized) (M62.81)    Time: 4010-2725 PT Time Calculation (min) (ACUTE ONLY): 17 min   Charges:   PT Evaluation $PT Eval Low Complexity: 1 Low PT Treatments $Therapeutic Activity: 8-22 mins        9:11 AM, 11/03/19 Mearl Latin PT, DPT Physical Therapist at [facility]

## 2019-11-03 NOTE — TOC Progression Note (Signed)
Transition of Care [facility]) - Progression Note    Patient Details  Name: VERGIE ZAHM MRN: [id number] Date of Birth: 03-May-1941  [facility] CM/SW Contact  Karn Cassis, [place] Phone Number: 11/03/2019, 1:45 PM  Clinical Narrative:  Pt's daughter-in-law, Britta Mccreedy selected [facility]. Authorization started (ref #: [id number]) with Fransico Him and clinicals faxed.     Expected Discharge Plan: Skilled Nursing Facility Barriers to Discharge: English as a second language teacher, Continued Medical Work up  Expected Discharge Plan and Services Expected Discharge Plan: Skilled Nursing Facility In-house Referral: Clinical Social Work   Post Acute Care Choice: Skilled Nursing Facility Living arrangements for the past 2 months: Single Family Home                                       Social Determinants of Health (SDOH) Interventions    Readmission Risk Interventions No flowsheet data found.

## 2019-11-03 NOTE — Progress Notes (Addendum)
PROGRESS NOTE    ARIANNIE PENALOZA  XNA:[id number] DOB: 1940/08/12 DOA: 11/01/2019 PCP: Jason Coop, FNP      Brief Narrative:  Mrs. Gotschall is a 79 y.o. 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  Patient had been in USOH until day of admission, developed sudden onset epigastric pain, became severe, radiated to back.  In the ER, hypertensive, requiring O2, lipase 884 and CT abdomen showed acute pancreatitis.          Assessment & Plan:  Acute pancreatitis Patient presents with acute onset epigastric pain, elevated lipase, and pancreatitis by CT.  Etiology unclear.  LFTs relatively normal, gallstones by right upper quadrant ultrasound, but normal CBD, I do not suspect gallstone pancreatitis.  She denies alcohol to me although her history taking is somewhat limited by sedation from pain medicine.    -N.p.o. -Continue IV fluids -Continue IV Dilaudid and antiemetics as needed  -Obtain MRCP given layering sludge on ultrasound    COPD Respiratory failure appears to be ruled out.  Patient has hypoxia only, likely due at this point to respiratory influence of analgesics.  Chronic diastolic CHF Hypertension Blood pressure elevated -Continue atenolol -Hold furosemide, lisinopril, atorvastatin, aspirin  History of aortic aneurysm rupture and repair  Restless leg syndrome Parkinsonism -Continue Requip -Continue Sinemet  Anxiety -Continue Paxil       Disposition: Status is: Inpatient  Remains inpatient appropriate because:Ongoing n.p.o. status, IV fluids, and IV opiates.   Dispo: The patient is from: Home              Anticipated d/c is to: SNF              Anticipated d/c date is: 3 days              Patient currently is not medically stable to d/c.              MDM: The below labs and imaging reports reviewed and summarized above.  Medication  management as above.    DVT prophylaxis: Lovenox Code Status: FULL Family Communication: Called to family member listed in chart, no answer    Consultants:     Procedures:   6/3 US abdomen -- gallbladder sludge  6/4 MRCP pending  Antimicrobials:      Culture data:              Subjective: The patient has epigastric pain, radiating to the back.  She has no fever.  She still has a lot of nausea.  She is somewhat somnolent after pain medicine.        Objective: Vitals:   11/02/19 1545 11/02/19 1551 11/02/19 2028 11/03/19 0430  BP:   (!) 116/57 (!) 159/75  Pulse: 88 88 90 99  Resp:  16  20  Temp:   98 F (36.7 C) 98.3 F (36.8 C)  TempSrc:    Oral  SpO2: 100% 100% (!) 81% 92%  Weight:      Height:        Intake/Output Summary (Last 24 hours) at 11/03/2019 1514 Last data filed at 11/03/2019 0603 Gross per 24 hour  Intake 2793.47 ml  Output 300 ml  Net 2493.47 ml   Filed Weights   11/01/19 1938  Weight: 86.2 kg    Examination: General appearance: Elderly adult female, lying in bed, sleeping, arousable with stimulation.     HEENT: Mild  conjunctivitis bilaterally, lashes caked.  Conjunctive a little inflamed.  No nasal deformity or discharge.  Oral mucosa dry, no oral lesions other than a small ecchymosis.  Hearing normal. Skin: Senile purpura, no suspicious rashes. Cardiac: Slightly tachycardic, no murmurs, JVP not visible, no lower extremity edema Respiratory: Respiratory rate normal, lung sounds diminished but no rales or wheezes. Abdomen: Moderate epigastric tenderness to palpation.  No rebound or rigidity. MSK:  Neuro: Extraocular movements intact, moves upper extremities with generalized weakness but symmetric strength.  Speech fluent. Psych: Attention diminished, somnolent and sleepy.     Data Reviewed: I have personally reviewed following labs and imaging studies:  CBC: Recent Labs  Lab 11/01/19 2036 11/02/19 0409 11/03/19 0456    WBC 9.0 9.7 11.9*  HGB 12.8 12.5 11.2*  HCT 40.9 41.0 36.3  MCV 98.1 100.5* 101.1*  PLT 180 135* 132*   Basic Metabolic Panel: Recent Labs  Lab 11/01/19 2036 11/02/19 0409 11/03/19 0456  NA 139 138 142  K 3.7 4.0 3.7  CL 99 98 104  CO2 30 29 31   GLUCOSE 117* 117* 91  BUN 19 17 14   CREATININE 0.62 0.62 0.49  CALCIUM 9.4 8.8* 8.6*   GFR: Estimated Creatinine Clearance: 66.4 mL/min (by C-G formula based on SCr of 0.49 mg/dL). Liver Function Tests: Recent Labs  Lab 11/01/19 2036 11/03/19 0456  AST 47* 18  ALT 10 <5  ALKPHOS 97 73  BILITOT 0.8 0.9  PROT 7.1 5.9*  ALBUMIN 3.9 3.0*   Recent Labs  Lab 11/01/19 2036  LIPASE 884*   No results for input(s): AMMONIA in the last 168 hours. Coagulation Profile: Recent Labs  Lab 11/01/19 2038  INR 1.0   Cardiac Enzymes: No results for input(s): CKTOTAL, CKMB, CKMBINDEX, TROPONINI in the last 168 hours. BNP (last 3 results) No results for input(s): PROBNP in the last 8760 hours. HbA1C: No results for input(s): HGBA1C in the last 72 hours. CBG: No results for input(s): GLUCAP in the last 168 hours. Lipid Profile: No results for input(s): CHOL, HDL, LDLCALC, TRIG, CHOLHDL, LDLDIRECT in the last 72 hours. Thyroid Function Tests: No results for input(s): TSH, T4TOTAL, FREET4, T3FREE, THYROIDAB in the last 72 hours. Anemia Panel: No results for input(s): VITAMINB12, FOLATE, FERRITIN, TIBC, IRON, RETICCTPCT in the last 72 hours. Urine analysis:    Component Value Date/Time   COLORURINE YELLOW 11/01/2019 2027   APPEARANCEUR CLEAR 11/01/2019 2027   LABSPEC 1.028 11/01/2019 2027   PHURINE 6.0 11/01/2019 2027   GLUCOSEU NEGATIVE 11/01/2019 2027   HGBUR NEGATIVE 11/01/2019 2027   BILIRUBINUR NEGATIVE 11/01/2019 2027   KETONESUR NEGATIVE 11/01/2019 2027   PROTEINUR 30 (A) 11/01/2019 2027   NITRITE NEGATIVE 11/01/2019 2027   LEUKOCYTESUR NEGATIVE 11/01/2019 2027   Sepsis  Labs: @LABRCNTIP (procalcitonin:4,lacticacidven:4)  ) Recent Results (from the past 240 hour(s))  SARS Coronavirus 2 by RT PCR (hospital order, performed in [facility] hospital lab) Nasopharyngeal Nasopharyngeal Swab     Status: None   Collection Time: 11/01/19 10:38 PM   Specimen: Nasopharyngeal Swab  Result Value Ref Range Status   SARS Coronavirus 2 NEGATIVE NEGATIVE Final    Comment: (NOTE) SARS-CoV-2 target nucleic acids are NOT DETECTED. The SARS-CoV-2 RNA is generally detectable in upper and lower respiratory specimens during the acute phase of infection. The lowest concentration of SARS-CoV-2 viral copies this assay can detect is 250 copies / mL. A negative result does not preclude SARS-CoV-2 infection and should not be used as the sole basis for treatment or  other patient management decisions.  A negative result may occur with improper specimen collection / handling, submission of specimen other than nasopharyngeal swab, presence of viral mutation(s) within the areas targeted by this assay, and inadequate number of viral copies (<250 copies / mL). A negative result must be combined with clinical observations, patient history, and epidemiological information. Fact Sheet for Patients:   BoilerBrush.com.cy Fact Sheet for Healthcare Providers: https://pope.com/ This test is not yet approved or cleared  by the [place] FDA and has been authorized for detection and/or diagnosis of SARS-CoV-2 by FDA under an Emergency Use Authorization (EUA).  This EUA will remain in effect (meaning this test can be used) for the duration of the COVID-19 declaration under Section 564(b)(1) of the Act, 21 U.S.C. section 360bbb-3(b)(1), unless the authorization is terminated or revoked sooner. Performed at [facility], [address], [place], [place] [address]          Radiology Studies: CT Angio Chest PE W/Cm &/Or Wo Cm  Result Date:  11/01/2019 CLINICAL DATA:  Back pain for 2 days with shortness of breath EXAM: CT ANGIOGRAPHY CHEST CT ABDOMEN AND PELVIS WITH CONTRAST TECHNIQUE: Multidetector CT imaging of the chest was performed using the standard protocol during bolus administration of intravenous contrast. Multiplanar CT image reconstructions and MIPs were obtained to evaluate the vascular anatomy. Multidetector CT imaging of the abdomen and pelvis was performed using the standard protocol during bolus administration of intravenous contrast. CONTRAST:  OMNIPAQUE IOHEXOL 350 MG/ML SOLN COMPARISON:  08/28/2019 FINDINGS: CTA CHEST FINDINGS Cardiovascular: Atherosclerotic calcifications of the thoracic aorta are noted. Aortic stent graft is noted involving the aortic arch and descending aorta. No findings to suggest acute dissection are seen. Coronary calcifications are noted. No cardiac enlargement is noted. Pulmonary artery shows a normal branching pattern. No filling defect to suggest pulmonary embolism is seen. Mediastinum/Nodes: Thoracic inlet is within normal limits. No hilar or mediastinal adenopathy is noted. Scattered calcified hilar lymph nodes are seen consistent with prior granulomatous disease. The esophagus as visualized is within normal limits. Lungs/Pleura: Lungs are well aerated bilaterally. Mild emphysematous changes are seen. Bibasilar scarring is noted. Changes of calcified granulomas are seen. Musculoskeletal: Degenerative changes of the thoracic spine are seen. No compression deformity is noted. No acute rib abnormality is noted. Review of the MIP images confirms the above findings. CT ABDOMEN and PELVIS FINDINGS Hepatobiliary: Liver is within normal limits. Scattered gallstones are noted within the gallbladder without complicating factors. Pancreas: Pancreas is well visualized within normal enhancement pattern. Considerable peripancreatic inflammatory changes noted extending superiorly towards the spleen as well as  inferiorly along Gerota's fascia and the left pericolic gutter. Similar changes are noted extending along Gerota's fascia on the right as well. Spleen: Multiple calcified granulomas are noted. Adrenals/Urinary Tract: Right adrenal gland is within normal limits. Left adrenal gland demonstrates a focal nodular lesion consistent with adenoma stable from the prior exam. Kidneys demonstrate bilateral cystic change. Nonobstructing left lower pole renal stone is seen. The ureters are within normal limits. The bladder is within normal limits. Stomach/Bowel: Scattered diverticular change of the colon is noted. No evidence of diverticulitis is seen. The appendix is not well visualized. No inflammatory changes to suggest appendicitis are seen. Stomach is unremarkable. No small bowel abnormality is seen. Vascular/Lymphatic: Atherosclerotic changes are noted in the infrarenal aorta with mild dilatation to 3.2 cm stable in appearance from the prior exam. 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 Reproductive: Status post hysterectomy. No adnexal masses. Other: No abdominal wall hernia or abnormality. No abdominopelvic ascites. Musculoskeletal: Degenerative changes of the lumbar spine are noted. Changes consistent with prior vertebral augmentation at L3 and L4 is seen. Review of the MIP images confirms the above findings. IMPRESSION: CTA of the chest: No evidence of pulmonary emboli. Thoracic aortic stent graft is again seen without complicating factors. Changes of prior granulomatous disease. Emphysematous changes. CT of the abdomen and pelvis: Changes consistent with acute pancreatitis without evidence of pancreatic necrosis. Diverticulosis without diverticulitis. Cholelithiasis. 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 Dilatation of the infrarenal aorta is again noted a 3.2 cm. Recommend  followup by ultrasound in 3 years. This recommendation follows ACR consensus guidelines: White Paper of the ACR Incidental Findings Committee II on Vascular Findings. Natasha Mead Coll Radiol 2013; 10:789-794 Electronically Signed   By: Inez Catalina M.D.   On: 11/01/2019 22:33   US Abdomen Complete  Result Date: 11/02/2019 CLINICAL DATA:  Pancreatitis, abdominal pain EXAM: ABDOMEN ULTRASOUND COMPLETE COMPARISON:  CT 11/01/2019 FINDINGS: Gallbladder: Multiple layering stones. No wall thickening or sonographic Murphy's sign. Common bile duct: Diameter: Normal caliber, 4 mm Liver: No focal lesion identified. Within normal limits in parenchymal echogenicity. Portal vein is patent on color Doppler imaging with normal direction of blood flow towards the liver. IVC: No abnormality visualized. Pancreas: Visualized portion unremarkable. Spleen: Size and appearance within normal limits. Right Kidney: Length: 13.0 cm. Multiple small cysts, the largest 1.3 cm. No hydronephrosis. Left Kidney: Length: 11.8 cm. Echogenicity within normal limits. No mass or hydronephrosis visualized. Abdominal aorta: 3.5 cm maximally proximal. Other findings: None. IMPRESSION: Cholelithiasis.  No sonographic evidence of acute cholecystitis. 3.5 cm proximal abdominal aortic aneurysm. Recommend followup by ultrasound in 2 years. This recommendation follows ACR consensus guidelines: White Paper of the ACR Incidental Findings Committee II on Vascular Findings. J Am Coll Radiol 2013; 10:789-794. Electronically Signed   By: Rolm Baptise M.D.   On: 11/02/2019 17:30   CT Abdomen Pelvis W Contrast  Result Date: 11/01/2019 CLINICAL DATA:  Back pain for 2 days with shortness of breath EXAM: CT ANGIOGRAPHY CHEST CT ABDOMEN AND PELVIS WITH CONTRAST TECHNIQUE: Multidetector CT imaging of the chest was performed using the standard protocol during bolus administration of intravenous contrast. Multiplanar CT image reconstructions and MIPs were obtained to evaluate  the vascular anatomy. Multidetector CT imaging of the abdomen and pelvis was performed using the standard protocol during bolus administration of intravenous contrast. CONTRAST:  141mL OMNIPAQUE IOHEXOL 350 MG/ML SOLN COMPARISON:  08/28/2019 FINDINGS: CTA CHEST FINDINGS Cardiovascular: Atherosclerotic calcifications of the thoracic aorta are noted. Aortic stent graft is noted involving the aortic arch and descending aorta. No findings to suggest acute dissection are seen. Coronary calcifications are noted. No cardiac enlargement is noted. Pulmonary artery shows a normal branching pattern. No filling defect to suggest pulmonary embolism is seen. Mediastinum/Nodes: Thoracic inlet is within normal limits. No hilar or mediastinal adenopathy is noted. Scattered calcified hilar lymph nodes are seen consistent with prior granulomatous disease. The esophagus as visualized is within normal limits. Lungs/Pleura: Lungs are well aerated bilaterally. Mild emphysematous changes are seen. Bibasilar scarring is noted. Changes of calcified granulomas are seen. Musculoskeletal: Degenerative changes of the thoracic spine are seen. No compression deformity is noted. No acute rib abnormality is noted. Review of the MIP images confirms the above findings. CT ABDOMEN and PELVIS FINDINGS Hepatobiliary: Liver  is within normal limits. Scattered gallstones are noted within the gallbladder without complicating factors. Pancreas: Pancreas is well visualized within normal enhancement pattern. Considerable peripancreatic inflammatory changes noted extending superiorly towards the spleen as well as inferiorly along Gerota's fascia and the left pericolic gutter. Similar changes are noted extending along Gerota's fascia on the right as well. Spleen: Multiple calcified granulomas are noted. Adrenals/Urinary Tract: Right adrenal gland is within normal limits. Left adrenal gland demonstrates a focal nodular lesion consistent with adenoma stable from  the prior exam. Kidneys demonstrate bilateral cystic change. Nonobstructing left lower pole renal stone is seen. The ureters are within normal limits. The bladder is within normal limits. Stomach/Bowel: Scattered diverticular change of the colon is noted. No evidence of diverticulitis is seen. The appendix is not well visualized. No inflammatory changes to suggest appendicitis are seen. Stomach is unremarkable. No small bowel abnormality is seen. Vascular/Lymphatic: Atherosclerotic changes are noted in the infrarenal aorta with mild dilatation to 3.2 cm stable in appearance from the prior exam. 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 Reproductive: Status post hysterectomy. No adnexal masses. Other: No abdominal wall hernia or abnormality. No abdominopelvic ascites. Musculoskeletal: Degenerative changes of the lumbar spine are noted. Changes consistent with prior vertebral augmentation at L3 and L4 is seen. Review of the MIP images confirms the above findings. IMPRESSION: CTA of the chest: No evidence of pulmonary emboli. Thoracic aortic stent graft is again seen without complicating factors. Changes of prior granulomatous disease. Emphysematous changes. CT of the abdomen and pelvis: Changes consistent with acute pancreatitis without evidence of pancreatic necrosis. Diverticulosis without diverticulitis. Cholelithiasis. 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 Dilatation of the infrarenal aorta is again noted a 3.2 cm. Recommend followup by ultrasound in 3 years. This recommendation follows ACR consensus guidelines: White Paper of the ACR Incidental Findings Committee II on Vascular Findings. Alba DestineJ Am Coll Radiol 2013; 10:789-794 Electronically Signed   By: Alcide CleverMark  Lukens M.D.   On: 11/01/2019 22:33        Scheduled Meds: . aspirin EC  81 mg Oral Q breakfast  . atenolol  25 mg  Oral BID  . carbidopa-levodopa  1 tablet Oral Daily  . enoxaparin (LOVENOX) injection  40 mg Subcutaneous Q24H  . famotidine  20 mg Oral Daily  . PARoxetine  20 mg Oral Daily  . rOPINIRole  0.5 mg Oral q morning - 10a  . rOPINIRole  1 mg Oral QHS   Continuous Infusions: . sodium chloride 150 mL/hr at 11/03/19 0547     LOS: 1 day    Time spent: 35 minutes    Alberteen Samhristopher P Dsean Vantol, MD [facility] 11/03/2019, 3:14 PM     Please page though AMION or Epic secure chat:  For password, contact charge nurse

## 2019-11-03 NOTE — NC FL2 (Signed)
[place] LEVEL OF CARE SCREENING TOOL     IDENTIFICATION  Patient Name: Briana Dean Birthdate: 12-Aug-1940 Sex: female Admission Date (Current Location): 11/01/2019  [place] and [place] Number:  [place] [facility] [id number] [place] and Address:  [facility] [address], [place]      Provider Number: [id number]  Attending Physician Name and Address:  Edwin Dada, *  Relative Name and Phone Number:       Current Level of Care: Hospital Recommended Level of Care: [place] Prior Approval Number:    Date Approved/Denied:   PASRR Number: [id number]  Discharge Plan: SNF    Current Diagnoses: Patient Active Problem List   Diagnosis Date Noted  . Acute pancreatitis 11/02/2019  . Abdominal pain 11/02/2019  . Bacteremia due to methicillin susceptible Staphylococcus aureus (MSSA) 10/28/2018  . Fever 10/22/2018  . Hypokalemia 10/22/2018  . Hip pain, left 10/22/2018  . Hypoxia 10/22/2018  . Congestive heart failure ([place]) 12/04/2016  . Ruptured aneurysm of thoracic aorta ([place]) 12/04/2016  . Essential hypertension 12/04/2016    Orientation RESPIRATION BLADDER Height & Weight     Self, Place  O2(2L) External catheter Weight: 190 lb (86.2 kg) Height:  5\' 7"  (170.2 cm)  BEHAVIORAL SYMPTOMS/MOOD NEUROLOGICAL BOWEL NUTRITION STATUS      Continent Diet(Currently NPO. Check d/c summary for updates.)  AMBULATORY STATUS COMMUNICATION OF NEEDS Skin   Extensive Assist Verbally Normal                       Personal Care Assistance Level of Assistance  Bathing, Dressing, Feeding Bathing Assistance: Limited assistance Feeding assistance: Limited assistance Dressing Assistance: Limited assistance     Functional Limitations Info  Sight, Speech, Hearing Sight Info: Impaired Hearing Info: Adequate Speech Info: Adequate    SPECIAL CARE FACTORS FREQUENCY  PT (By licensed PT)     PT Frequency: daily              Contractures Contractures Info: Not present    Additional Factors Info  Code Status, Allergies, Psychotropic Code Status Info: Full code Allergies Info: Aspirin, Penicillins           Current Medications (11/03/2019):  This is the current hospital active medication list Current Facility-Administered Medications  Medication Dose Route Frequency Provider Last Rate Last Admin  . 0.9 %  sodium chloride infusion   Intravenous Continuous Edwin Dada, MD 150 mL/hr at 11/03/19 0547 New Bag at 11/03/19 0547  . acetaminophen (TYLENOL) tablet 650 mg  650 mg Oral Q6H PRN Bunnie Pion Z, DO       Or  . acetaminophen (TYLENOL) suppository 650 mg  650 mg Rectal Q6H PRN Bunnie Pion Z, DO      . aspirin EC tablet 81 mg  81 mg Oral Q breakfast Edwin Dada, MD   81 mg at 11/03/19 0849  . atenolol (TENORMIN) tablet 25 mg  25 mg Oral BID Edwin Dada, MD   25 mg at 11/03/19 0848  . carbidopa-levodopa (SINEMET IR) 25-100 MG per tablet immediate release 1 tablet  1 tablet Oral Daily Danford, Suann Larry, MD   1 tablet at 11/03/19 0848  . enoxaparin (LOVENOX) injection 40 mg  40 mg Subcutaneous Q24H Bunnie Pion Z, DO   40 mg at 11/03/19 0849  . famotidine (PEPCID) tablet 20 mg  20 mg Oral Daily Danford, Suann Larry, MD   20 mg at 11/03/19 0847  . HYDROmorphone (  DILAUDID) injection 1 mg  1 mg Intravenous Q4H PRN Alberteen Sam, MD   1 mg at 11/03/19 0849  . ondansetron (ZOFRAN) tablet 4 mg  4 mg Oral Q6H PRN Renda Rolls Z, DO       Or  . ondansetron [facility]) injection 4 mg  4 mg Intravenous Q6H PRN Renda Rolls Z, DO   4 mg at 11/03/19 0434  . PARoxetine (PAXIL) tablet 20 mg  20 mg Oral Daily Danford, Earl Lites, MD   20 mg at 11/03/19 0848  . rOPINIRole (REQUIP) tablet 0.5 mg  0.5 mg Oral q morning - 10a Danford, Earl Lites, MD   0.5 mg at 11/03/19 0848  . rOPINIRole (REQUIP) tablet 1 mg  1 mg Oral QHS Danford, Earl Lites, MD   1 mg at  11/02/19 2201     Discharge Medications: Please see discharge summary for a list of discharge medications.  Relevant Imaging Results:  Relevant Lab Results:   Additional Information SSN: [id number]. Pt has had COVID vaccines.  Karn Cassis, LCSW

## 2019-11-03 NOTE — Plan of Care (Signed)
  Problem: Acute Rehab PT Goals(only PT should resolve) Goal: Patient Will Transfer Sit To/From Stand Outcome: Progressing Flowsheets (Taken 11/03/2019 0913) Patient will transfer sit to/from stand: with supervision Goal: Pt Will Transfer Bed To Chair/Chair To Bed Outcome: Progressing Flowsheets (Taken 11/03/2019 0913) Pt will Transfer Bed to Chair/Chair to Bed: min guard assist Goal: Pt Will Ambulate Outcome: Progressing Flowsheets (Taken 11/03/2019 0913) Pt will Ambulate:  25 feet  with min guard assist  with least restrictive assistive device  9:13 AM, 11/03/19 Wyman Songster PT, DPT Physical Therapist at [facility]

## 2019-11-03 NOTE — TOC Initial Note (Signed)
Transition of Care [facility]) - Initial/Assessment Note    Patient Details  Name: Briana Dean MRN: [id number] Date of Birth: [birth year]  [facility] of [facility]) CM/SW Contact:    Salome Arnt, LCSW Phone Number: 11/03/2019, 9:39 AM  Clinical Narrative: LCSW met with pt at bedside. Pt alert, but pleasantly confused and requested that LCSW speak with her daughter-in-law, Pamala Hurry whom she lives with to get additional information. Pt admits she has been very weak for the past several days. PT evaluated pt and recommend SNF. Pt has been to [facility] and [facility] before. Pamala Hurry works during the day and agrees with d/c plan. She requests placement in [facility]. Pamala Hurry is aware of need for insurance authorization for SNF.                  Expected Discharge Plan: Skilled Nursing Facility Barriers to Discharge: Insurance Authorization, Continued Medical Work up   Patient Goals and CMS Choice Patient states their goals for this hospitalization and ongoing recovery are:: Short-term SNF CMS Medicare.gov Compare Post Acute Care list provided to:: Patient Choice offered to / list presented to : Patient  Expected Discharge Plan and Services Expected Discharge Plan: [place] In-house Referral: Clinical Social Work   Post Acute Care Choice: [place] Living arrangements for the past 2 months: [place]                                      Prior Living Arrangements/Services Living arrangements for the past 2 months: Single Family Home Lives with:: Relatives Patient language and need for interpreter reviewed:: Yes Do you feel safe going back to the place where you live?: Yes      Need for Family Participation in Patient Care: Yes (Comment) Care giver support system in place?: Yes (comment) Current home services: DME(Walker, cane, shower chair, BSC) Criminal Activity/Legal Involvement Pertinent to Current  Situation/Hospitalization: No - Comment as needed  Activities of Daily Living Home Assistive Devices/Equipment: Walker (specify type) ADL Screening (condition at time of admission) Patient's cognitive ability adequate to safely complete daily activities?: Yes Is the patient deaf or have difficulty hearing?: No Does the patient have difficulty seeing, even when wearing glasses/contacts?: No Does the patient have difficulty concentrating, remembering, or making decisions?: Yes Patient able to express need for assistance with ADLs?: Yes Does the patient have difficulty dressing or bathing?: Yes Independently performs ADLs?: No Communication: Independent Dressing (OT): Needs assistance Is this a change from baseline?: Pre-admission baseline Grooming: Needs assistance Is this a change from baseline?: Pre-admission baseline Feeding: Independent Bathing: Needs assistance Is this a change from baseline?: Pre-admission baseline Toileting: Independent In/Out Bed: Independent Walks in Home: Needs assistance Is this a change from baseline?: Pre-admission baseline Does the patient have difficulty walking or climbing stairs?: Yes Weakness of Legs: Both Weakness of Arms/Hands: Both  Permission Sought/Granted Permission sought to share information with : Family Supports Permission granted to share information with : Yes, Verbal Permission Granted  Share Information with NAME: Pamala Hurry     Permission granted to share info w Relationship: daughter-in-law     Emotional Assessment Appearance:: Appears stated age Attitude/Demeanor/Rapport: Other (comment)(somewhat confused) Affect (typically observed): Other (comment)(somewhat confused) Orientation: : Oriented to Self, Oriented to Place Alcohol / Substance Use: Not Applicable    Admission diagnosis:  Acute pancreatitis [K85.90] Pancreatitis [K85.90] Acute respiratory failure with hypoxia ([place]) [  J96.01] Acute pancreatitis without infection or  necrosis, unspecified pancreatitis type [K85.90] Patient Active Problem List   Diagnosis Date Noted  . Acute pancreatitis 11/02/2019  . Abdominal pain 11/02/2019  . Bacteremia due to methicillin susceptible Staphylococcus aureus (MSSA) 10/28/2018  . Fever 10/22/2018  . Hypokalemia 10/22/2018  . Hip pain, left 10/22/2018  . Hypoxia 10/22/2018  . Congestive heart failure ([place]) 12/04/2016  . Ruptured aneurysm of thoracic aorta ([place]) 12/04/2016  . Essential hypertension 12/04/2016   PCP:  Wannetta Sender, FNP Pharmacy:   [facility] [address] [place] [address] [place] [address] Phone: [phone] Fax: [phone]     Social Determinants of Health (SDOH) Interventions    Readmission Risk Interventions No flowsheet data found.

## 2019-11-04 ENCOUNTER — Encounter (HOSPITAL_COMMUNITY): Payer: Self-pay | Admitting: Internal Medicine

## 2019-11-04 ENCOUNTER — Inpatient Hospital Stay (HOSPITAL_COMMUNITY): Payer: Medicare HMO

## 2019-11-04 DIAGNOSIS — K851 Biliary acute pancreatitis without necrosis or infection: Principal | ICD-10-CM

## 2019-11-04 LAB — COMPREHENSIVE METABOLIC PANEL
ALT: 5 U/L (ref 0–44)
AST: 19 U/L (ref 15–41)
Albumin: 2.9 g/dL — ABNORMAL LOW (ref 3.5–5.0)
Alkaline Phosphatase: 71 U/L (ref 38–126)
Anion gap: 10 (ref 5–15)
BUN: 17 mg/dL (ref 8–23)
CO2: 27 mmol/L (ref 22–32)
Calcium: 8.6 mg/dL — ABNORMAL LOW (ref 8.9–10.3)
Chloride: 107 mmol/L (ref 98–111)
Creatinine, Ser: 0.45 mg/dL (ref 0.44–1.00)
GFR calc Af Amer: >60 mL/min (ref >60)
GFR calc non Af Amer: >60 mL/min (ref >60)
Glucose, Bld: 108 mg/dL — ABNORMAL HIGH (ref 70–99)
Potassium: 3.7 mmol/L (ref 3.5–5.1)
Sodium: 144 mmol/L (ref 135–145)
Total Bilirubin: 0.6 mg/dL (ref 0.3–1.2)
Total Protein: 6 g/dL — ABNORMAL LOW (ref 6.5–8.1)

## 2019-11-04 LAB — GLUCOSE, CAPILLARY: Glucose-Capillary: 99 mg/dL (ref 70–99)

## 2019-11-04 LAB — BLOOD GAS, ARTERIAL
Acid-Base Excess: 3.8 mmol/L — ABNORMAL HIGH (ref 0.0–2.0)
Bicarbonate: 24.9 mmol/L (ref 20.0–28.0)
FIO2: 32
O2 Saturation: 89 %
Patient temperature: 37
pCO2 arterial: 88.3 mmHg (ref 32.0–48.0)
pH, Arterial: 7.179 — CL (ref 7.350–7.450)
pO2, Arterial: 62.5 mmHg — ABNORMAL LOW (ref 83.0–108.0)

## 2019-11-04 LAB — CBC
HCT: 39.3 % (ref 36.0–46.0)
Hemoglobin: 12.2 g/dL (ref 12.0–15.0)
MCH: 31.4 pg (ref 26.0–34.0)
MCHC: 31 g/dL (ref 30.0–36.0)
MCV: 101 fL — ABNORMAL HIGH (ref 80.0–100.0)
Platelets: 147 10*3/uL — ABNORMAL LOW (ref 150–400)
RBC: 3.89 MIL/uL (ref 3.87–5.11)
RDW: 15.9 % — ABNORMAL HIGH (ref 11.5–15.5)
WBC: 12.7 10*3/uL — ABNORMAL HIGH (ref 4.0–10.5)
nRBC: 0 % (ref 0.0–0.2)

## 2019-11-04 LAB — PROCALCITONIN: Procalcitonin: <0.1 ng/mL

## 2019-11-04 LAB — MRSA PCR SCREENING: MRSA by PCR: NEGATIVE

## 2019-11-04 LAB — TROPONIN I (HIGH SENSITIVITY)
Troponin I (High Sensitivity): 40 ng/L — ABNORMAL HIGH (ref <18)
Troponin I (High Sensitivity): 77 ng/L — ABNORMAL HIGH (ref <18)

## 2019-11-04 MED ORDER — SODIUM CHLORIDE 0.9 % IV SOLN: Freq: Once | INTRAVENOUS | Status: AC

## 2019-11-04 MED ORDER — ORAL CARE MOUTH RINSE
15.0000 mL | Freq: Two times a day (BID) | OROMUCOSAL | Status: DC
  Administered 2019-11-05 – 2019-11-07 (×3): 15 mL via OROMUCOSAL

## 2019-11-04 MED ORDER — CHLORHEXIDINE GLUCONATE 0.12 % MT SOLN
15.0000 mL | Freq: Two times a day (BID) | OROMUCOSAL | Status: DC
  Administered 2019-11-04 – 2019-11-07 (×6): 15 mL via OROMUCOSAL
  Filled 2019-11-04 (×6): qty 15

## 2019-11-04 MED ORDER — FUROSEMIDE 10 MG/ML IJ SOLN
20.0000 mg | Freq: Every day | INTRAMUSCULAR | Status: DC
  Administered 2019-11-04 – 2019-11-05 (×2): 20 mg via INTRAVENOUS
  Filled 2019-11-04 (×2): qty 2

## 2019-11-04 MED ORDER — FUROSEMIDE 10 MG/ML IJ SOLN
20.0000 mg | Freq: Once | INTRAMUSCULAR | Status: AC
  Administered 2019-11-04: 20 mg via INTRAVENOUS
  Filled 2019-11-04: qty 2

## 2019-11-04 MED ORDER — CHLORHEXIDINE GLUCONATE CLOTH 2 % EX PADS
6.0000 | MEDICATED_PAD | Freq: Every day | CUTANEOUS | Status: DC
  Administered 2019-11-04 – 2019-11-07 (×4): 6 via TOPICAL

## 2019-11-04 MED ORDER — SODIUM CHLORIDE 0.9% FLUSH
3.0000 mL | Freq: Two times a day (BID) | INTRAVENOUS | Status: DC
  Administered 2019-11-04 – 2019-11-07 (×6): 3 mL via INTRAVENOUS

## 2019-11-04 MED ORDER — POTASSIUM CHLORIDE CRYS ER 10 MEQ PO TBCR
10.0000 meq | EXTENDED_RELEASE_TABLET | Freq: Every day | ORAL | Status: AC
  Administered 2019-11-05 – 2019-11-06 (×2): 10 meq via ORAL
  Filled 2019-11-04 (×3): qty 1

## 2019-11-04 NOTE — Progress Notes (Signed)
Bair hugger applied.

## 2019-11-04 NOTE — Progress Notes (Signed)
   11/04/19 1645  Assess: MEWS Score  Temp 98.7 F (37.1 C)  BP (!) 151/79  Pulse Rate (!) 106  Resp 16  Level of Consciousness Responds to Voice  SpO2 94 %  O2 Device Nasal Cannula  O2 Flow Rate (L/min) 3 L/min  Assess: MEWS Score  MEWS Temp 0  MEWS Systolic 0  MEWS Pulse 1  MEWS RR 0  MEWS LOC 1  MEWS Score 2  MEWS Score Color Yellow  Assess: if the MEWS score is Yellow or Red  Were vital signs taken at a resting state? Yes  Focused Assessment Documented focused assessment  Early Detection of Sepsis Score *See Row Information* Low  MEWS guidelines implemented *See Row Information* Yes  Treat  MEWS Interventions Escalated (See documentation below)  Take Vital Signs  Increase Vital Sign Frequency  Yellow: Q 2hr X 2 then Q 4hr X 2, if remains yellow, continue Q 4hrs  Escalate  MEWS: Escalate Yellow: discuss with charge nurse/RN and consider discussing with provider and RRT  Notify: Charge Nurse/RN  Name of Charge Nurse/RN Notified Misty Stanley RN   Date Charge Nurse/RN Notified 11/04/19  Time Charge Nurse/RN Notified 1645  Notify: Provider  Provider Name/Title Jerelene Redden  Date Provider Notified 11/04/19  Time Provider Notified 1645  Notification Type Page  Notification Reason Change in status  Response See new orders (Arterial blood gas)  Date of Provider Response 11/04/19  Time of Provider Response 1645  Notify: Rapid Response  Name of Rapid Response RN Notified Tim RN   Date Rapid Response Notified 11/04/19  Time Rapid Response Notified 1645  Document  Patient Outcome Transferred/level of care increased  Progress note created (see row info) Yes

## 2019-11-04 NOTE — Progress Notes (Signed)
Dr. Welton Flakes notified of B/P 89/47 MAP 58, HR 82.

## 2019-11-04 NOTE — Progress Notes (Signed)
Patient transferred to stepdown ICU 09. Report given at Bedside to [facility]. All questions were answered and no further questions at this time.

## 2019-11-04 NOTE — Progress Notes (Signed)
PROGRESS NOTE    Briana Dean  GEZ:[id number] DOB: 1940/11/03 DOA: 11/01/2019 PCP: Jason Coop, FNP    Brief Narrative: [name] is a 79 y.o. 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  Patient had been in [facility] until day of admission, developed sudden onset epigastric pain, became severe, radiated to back.  In the ER, hypertensive, requiring O2, lipase 884 and CT abdomen showed acute pancreatitis.     Assessment & Plan:   Principal Problem:   Acute pancreatitis Active Problems:   Hypoxia   Abdominal pain   #1 acute pancreatitis of unclear etiology. Patient presented with abdominal pain. Lipase was over 800 now it is down to normal. Will start patient on clear liquid diet. DC IV fluids. MRCP with gallstones discussed with Dr. Henreitta Leber who will see the patient.  #2 COPD stable  #3 history of chronic diastolic CHF patient became hypoxic, stat chest x-ray shows left pleural effusion and pulmonary edema. IV fluids DC'd. Will start Lasix. Patient was on Lasix at home. She was also on lisinopril statin and aspirin at home which was on hold as she was n.p.o. I have held her beta-blocker just to give me enough room to give her Lasix daily since her blood pressure is soft.  #4 Parkinson's disease/restless leg syndrome on Sinemet and Requip  #5 history of anxiety on Paxil  #6 aortic aneurysm repair stable   Estimated body mass index is 29.76 kg/m as calculated from the following:   Height as of this encounter: 5\' 7"  (1.702 m).   Weight as of this encounter: 86.2 kg.  DVT prophylaxis: Lovenox Code Status: Full code Family Communication: Discussed with patient disposition Plan:  Status is: Inpatient  Dispo: The patient is from: Home              Anticipated d/c is to: Skilled nursing facility              Anticipated d/c date is: Unknown              Patient  currently is not medically stable to d/c. Patient with acute pancreatitis chest starting clear liquids today was on IV fluids now with fluid overload with hypoxia and chest x-ray with pulmonary edema on Lasix   Consultants:   Dr. general surgery  Procedures: MRCP 11/03/2019 Antimicrobials: Subjective: Patient resting in bed oriented to hospital denies any abdominal pain  Objective: Vitals:   11/03/19 2129 11/04/19 0613 11/04/19 1242 11/04/19 1430  BP: (!) 155/72 (!) 154/72 (!) 159/97 (!) 125/55  Pulse: 90 96 (!) 107 84  Resp: 16 16 16 16   Temp: 98 F (36.7 C) 97.8 F (36.6 C) (!) 94.4 F (34.7 C) 97.8 F (36.6 C)  TempSrc: Oral Axillary  Axillary  SpO2: 93% 90% 96% 97%  Weight:      Height:        Intake/Output Summary (Last 24 hours) at 11/04/2019 1432 Last data filed at 11/04/2019 1340 Gross per 24 hour  Intake 60 ml  Output 1350 ml  Net -1290 ml   Filed Weights   11/01/19 1938  Weight: 86.2 kg    Examination:  General exam: Appears calm and comfortable  Respiratory system: Clear to auscultation. Respiratory effort normal. Cardiovascular system: S1 & S2 heard, RRR. No JVD, murmurs, rubs, gallops or clicks. No pedal edema. Gastrointestinal system: Abdomen is nondistended, soft and nontender.  No organomegaly or masses felt. Normal bowel sounds heard. Central nervous system: Alert and oriented. No focal neurological deficits. Extremities: Symmetric 5 x 5 power. Skin: No rashes, lesions or ulcers Psychiatry: Judgement and insight appear normal. Mood & affect appropriate.     Data Reviewed: I have personally reviewed following labs and imaging studies  CBC: Recent Labs  Lab 11/01/19 2036 11/02/19 0409 11/03/19 0456  WBC 9.0 9.7 11.9*  HGB 12.8 12.5 11.2*  HCT 40.9 41.0 36.3  MCV 98.1 100.5* 101.1*  PLT 180 135* 132*   Basic Metabolic Panel: Recent Labs  Lab 11/01/19 2036 11/02/19 0409 11/03/19 0456  NA 139 138 142  K 3.7 4.0 3.7  CL 99 98 104    CO2 30 29 31   GLUCOSE 117* 117* 91  BUN 19 17 14   CREATININE 0.62 0.62 0.49  CALCIUM 9.4 8.8* 8.6*   GFR: Estimated Creatinine Clearance: 66.4 mL/min (by C-G formula based on SCr of 0.49 mg/dL). Liver Function Tests: Recent Labs  Lab 11/01/19 2036 11/03/19 0456  AST 47* 18  ALT 10 <5  ALKPHOS 97 73  BILITOT 0.8 0.9  PROT 7.1 5.9*  ALBUMIN 3.9 3.0*   Recent Labs  Lab 11/01/19 2036 11/03/19 1537  LIPASE 884* 33   No results for input(s): AMMONIA in the last 168 hours. Coagulation Profile: Recent Labs  Lab 11/01/19 2038  INR 1.0   Cardiac Enzymes: No results for input(s): CKTOTAL, CKMB, CKMBINDEX, TROPONINI in the last 168 hours. BNP (last 3 results) No results for input(s): PROBNP in the last 8760 hours. HbA1C: No results for input(s): HGBA1C in the last 72 hours. CBG: No results for input(s): GLUCAP in the last 168 hours. Lipid Profile: No results for input(s): CHOL, HDL, LDLCALC, TRIG, CHOLHDL, LDLDIRECT in the last 72 hours. Thyroid Function Tests: No results for input(s): TSH, T4TOTAL, FREET4, T3FREE, THYROIDAB in the last 72 hours. Anemia Panel: No results for input(s): VITAMINB12, FOLATE, FERRITIN, TIBC, IRON, RETICCTPCT in the last 72 hours. Sepsis Labs: No results for input(s): PROCALCITON, LATICACIDVEN in the last 168 hours.  Recent Results (from the past 240 hour(s))  SARS Coronavirus 2 by RT PCR (hospital order, performed in [facility] hospital lab) Nasopharyngeal Nasopharyngeal Swab     Status: None   Collection Time: 11/01/19 10:38 PM   Specimen: Nasopharyngeal Swab  Result Value Ref Range Status   SARS Coronavirus 2 NEGATIVE NEGATIVE Final    Comment: (NOTE) SARS-CoV-2 target nucleic acids are NOT DETECTED. The SARS-CoV-2 RNA is generally detectable in upper and lower respiratory specimens during the acute phase of infection. The lowest concentration of SARS-CoV-2 viral copies this assay can detect is 250 copies / mL. A negative result does  not preclude SARS-CoV-2 infection and should not be used as the sole basis for treatment or other patient management decisions.  A negative result may occur with improper specimen collection / handling, submission of specimen other than nasopharyngeal swab, presence of viral mutation(s) within the areas targeted by this assay, and inadequate number of viral copies (<250 copies / mL). A negative result must be combined with clinical observations, patient history, and epidemiological information. Fact Sheet for Patients:   [facility] Fact Sheet for Healthcare Providers: 01/01/20 This test is not yet approved or cleared  by the [employer] FDA and has been authorized for detection and/or diagnosis of SARS-CoV-2 by FDA under an Emergency Use Authorization (EUA).  This EUA will remain in effect (meaning this test can be used) for the duration  of the COVID-19 declaration under Section 564(b)(1) of the Act, 21 U.S.C. section 360bbb-3(b)(1), unless the authorization is terminated or revoked sooner. Performed at [facility], [address] [place], [place] [address]          Radiology Studies: US Abdomen Complete  Result Date: 11/02/2019 CLINICAL DATA:  Pancreatitis, abdominal pain EXAM: ABDOMEN ULTRASOUND COMPLETE COMPARISON:  CT 11/01/2019 FINDINGS: Gallbladder: Multiple layering stones. No wall thickening or sonographic Murphy's sign. Common bile duct: Diameter: Normal caliber, 4 mm Liver: No focal lesion identified. Within normal limits in parenchymal echogenicity. Portal vein is patent on color Doppler imaging with normal direction of blood flow towards the liver. IVC: No abnormality visualized. Pancreas: Visualized portion unremarkable. Spleen: Size and appearance within normal limits. Right Kidney: Length: 13.0 cm. Multiple small cysts, the largest 1.3 cm. No hydronephrosis. Left Kidney: Length: 11.8 cm. Echogenicity within  normal limits. No mass or hydronephrosis visualized. Abdominal aorta: 3.5 cm maximally proximal. Other findings: None. IMPRESSION: Cholelithiasis.  No sonographic evidence of acute cholecystitis. 3.5 cm proximal abdominal aortic aneurysm. Recommend followup by ultrasound in 2 years. This recommendation follows ACR consensus guidelines: White Paper of the ACR Incidental Findings Committee II on Vascular Findings. J Am Coll Radiol 2013; 10:789-794. Electronically Signed   By: Charlett Nose M.D.   On: 11/02/2019 17:30   MR 3D Recon At Scanner  Result Date: 11/03/2019 CLINICAL DATA:  Upper abdominal pain starting last night with nausea and vomiting. EXAM: MRI ABDOMEN WITHOUT AND WITH CONTRAST (INCLUDING MRCP) TECHNIQUE: Multiplanar multisequence MR imaging of the abdomen was performed both before and after the administration of intravenous contrast. Heavily T2-weighted images of the biliary and pancreatic ducts were obtained, and three-dimensional MRCP images were rendered by post processing. CONTRAST:  36mL GADAVIST GADOBUTROL 1 MMOL/ML IV SOLN COMPARISON:  11/01/2019 CT and 11/02/2019 ultrasound FINDINGS: Despite efforts by the technologist and patient, severe motion artifact is present on today's exam and could not be eliminated. This reduces exam sensitivity and specificity. This is a common result when MRCP is attempted in the inpatient setting where patients are less likely to be able to breath hold and cooperate in controlling motion. Lower chest: Dependent atelectasis in the lower lobes. Mild cardiomegaly. Hepatobiliary: Trace perihepatic ascites. Cholelithiasis is faintly suggested but better shown on prior CT. No biliary dilatation. The extrahepatic biliary tree is difficult to even identify on the motion artifact affected MRCP images, much less assess for pathology. No abnormal restriction of diffusion in the liver. No large abnormal enhancing lesion in the liver. Pancreas: No findings of pancreatic  pseudocyst, abscess, or necrosis. Indistinct margins of the pancreas may be from the motion artifact or from pancreatitis. Spleen: Speckled low signal intensity in the spleen corresponding to the splenic calcifications from old granulomatous disease. No splenomegaly. Adrenals/Urinary Tract: Benign-appearing renal cysts including 2 complex cysts of the left kidney. There is a right adrenal mass which is too blurred by motion artifact to further characterize. No findings of hydronephrosis. Mild bilateral perirenal stranding, nonspecific. Stomach/Bowel: Unremarkable Vascular/Lymphatic: Stable small infrarenal abdominal aortic aneurysm. 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 Other: Mild perihepatic and perisplenic ascites. Mild peripancreatic edema and edema tracking along the retroperitoneum. Musculoskeletal: Thoracic and lumbar compression fractures. Levoconvex thoracolumbar scoliosis with lumbar vertebral augmentations. Elevated left hemidiaphragm. IMPRESSION: 1. Despite efforts by the technologist and patient, markedly severe motion artifact is present on today's exam and could not be eliminated. This reduces  exam sensitivity and specificity. 2. No findings of pancreatic pseudocyst, abscess, or necrosis. There is some indistinctness of the pancreatic parenchyma which may be from the motion artifact or from pancreatitis. 3. Trace perihepatic and perisplenic ascites. Edema tracks along the retroperitoneum and in the peripancreatic region. 4. Cholelithiasis. 5. Stable small infrarenal abdominal aortic aneurysm. 6. Thoracic and lumbar compression fractures. 7. Dependent atelectasis in the lower lobes. 8. Mild cardiomegaly. 9. Nonspecific right adrenal mass. Electronically Signed   By: Gaylyn RongWalter  Liebkemann M.D.   On: 11/03/2019 21:31   DG Chest Port 1 View  Result Date: 11/04/2019 CLINICAL DATA:  Hypoxemia EXAM: PORTABLE CHEST 1 VIEW  COMPARISON:  10/22/2018 FINDINGS: Cardiomegaly status post aortic stent endograft repair. Small left pleural effusion. Diffuse interstitial pulmonary opacity and pulmonary vascular prominence. IMPRESSION: Cardiomegaly with small left pleural effusion and diffuse interstitial pulmonary opacity, likely edema. Electronically Signed   By: Lauralyn PrimesAlex  Bibbey M.D.   On: 11/04/2019 14:16   MR ABDOMEN MRCP W WO CONTAST  Result Date: 11/03/2019 CLINICAL DATA:  Upper abdominal pain starting last night with nausea and vomiting. EXAM: MRI ABDOMEN WITHOUT AND WITH CONTRAST (INCLUDING MRCP) TECHNIQUE: Multiplanar multisequence MR imaging of the abdomen was performed both before and after the administration of intravenous contrast. Heavily T2-weighted images of the biliary and pancreatic ducts were obtained, and three-dimensional MRCP images were rendered by post processing. CONTRAST:  9mL GADAVIST GADOBUTROL 1 MMOL/ML IV SOLN COMPARISON:  11/01/2019 CT and 11/02/2019 ultrasound FINDINGS: Despite efforts by the technologist and patient, severe motion artifact is present on today's exam and could not be eliminated. This reduces exam sensitivity and specificity. This is a common result when MRCP is attempted in the inpatient setting where patients are less likely to be able to breath hold and cooperate in controlling motion. Lower chest: Dependent atelectasis in the lower lobes. Mild cardiomegaly. Hepatobiliary: Trace perihepatic ascites. Cholelithiasis is faintly suggested but better shown on prior CT. No biliary dilatation. The extrahepatic biliary tree is difficult to even identify on the motion artifact affected MRCP images, much less assess for pathology. No abnormal restriction of diffusion in the liver. No large abnormal enhancing lesion in the liver. Pancreas: No findings of pancreatic pseudocyst, abscess, or necrosis. Indistinct margins of the pancreas may be from the motion artifact or from pancreatitis. Spleen: Speckled low  signal intensity in the spleen corresponding to the splenic calcifications from old granulomatous disease. No splenomegaly. Adrenals/Urinary Tract: Benign-appearing renal cysts including 2 complex cysts of the left kidney. There is a right adrenal mass which is too blurred by motion artifact to further characterize. No findings of hydronephrosis. Mild bilateral perirenal stranding, nonspecific. Stomach/Bowel: Unremarkable Vascular/Lymphatic: Stable small infrarenal abdominal aortic aneurysm. 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 Other: Mild perihepatic and perisplenic ascites. Mild peripancreatic edema and edema tracking along the retroperitoneum. Musculoskeletal: Thoracic and lumbar compression fractures. Levoconvex thoracolumbar scoliosis with lumbar vertebral augmentations. Elevated left hemidiaphragm. IMPRESSION: 1. Despite efforts by the technologist and patient, markedly severe motion artifact is present on today's exam and could not be eliminated. This reduces exam sensitivity and specificity. 2. No findings of pancreatic pseudocyst, abscess, or necrosis. There is some indistinctness of the pancreatic parenchyma which may be from the motion artifact or from pancreatitis. 3. Trace perihepatic and perisplenic ascites. Edema tracks along the retroperitoneum and in the peripancreatic region. 4. Cholelithiasis. 5. Stable small infrarenal abdominal aortic aneurysm. 6. Thoracic and lumbar compression fractures.  7. Dependent atelectasis in the lower lobes. 8. Mild cardiomegaly. 9. Nonspecific right adrenal mass. Electronically Signed   By: Van Clines M.D.   On: 11/03/2019 21:31        Scheduled Meds: . aspirin EC  81 mg Oral Q breakfast  . atenolol  25 mg Oral BID  . carbidopa-levodopa  1 tablet Oral Daily  . enoxaparin (LOVENOX) injection  40 mg Subcutaneous Q24H  . famotidine  20 mg Oral Daily  . PARoxetine  20 mg  Oral Daily  . rOPINIRole  0.5 mg Oral q morning - 10a  . rOPINIRole  1 mg Oral QHS   Continuous Infusions: . sodium chloride 150 mL/hr at 11/04/19 1138     LOS: 2 days     Georgette Shell, MD 11/04/2019, 2:32 PM

## 2019-11-04 NOTE — Progress Notes (Signed)
CRITICAL VALUE ALERT  Critical Value: pH 7.179   PCO2 88.3  Date & Time Notied:  11/04/2019, 1700  Provider Notified: Dr. Jerolyn Center  Orders Received/Actions taken: Transfer to stepdown/BIPAP

## 2019-11-04 NOTE — Progress Notes (Signed)
   11/04/19 1242  Assess: MEWS Score  Temp (!) 94.4 F (34.7 C)  BP (!) 159/97  Pulse Rate (!) 107  Resp 16  SpO2 96 %  O2 Device Nasal Cannula  O2 Flow Rate (L/min) 5 L/min  Assess: MEWS Score  MEWS Temp 2  MEWS Systolic 0  MEWS Pulse 1  MEWS RR 0  MEWS LOC 0  MEWS Score 3  MEWS Score Color Yellow  Assess: if the MEWS score is Yellow or Red  Were vital signs taken at a resting state? Yes  Focused Assessment Documented focused assessment  Early Detection of Sepsis Score *See Row Information* Low  MEWS guidelines implemented *See Row Information* Yes  Treat  MEWS Interventions Escalated (See documentation below);Consulted Respiratory Therapy  Take Vital Signs  Increase Vital Sign Frequency  Yellow: Q 2hr X 2 then Q 4hr X 2, if remains yellow, continue Q 4hrs  Escalate  MEWS: Escalate Yellow: discuss with charge nurse/RN and consider discussing with provider and RRT  Notify: Charge Nurse/RN  Name of Charge Nurse/RN Notified Misty Stanley RN   Date Charge Nurse/RN Notified 11/04/19  Time Charge Nurse/RN Notified 1242  Notify: Provider  Provider Name/Title Jerelene Redden  Date Provider Notified 11/04/19  Time Provider Notified 1252  Notification Type Page  Notification Reason Change in status  Response See new orders  Date of Provider Response 11/04/19  Time of Provider Response 1252  Notify: Rapid Response  Name of Rapid Response RN Notified [place] Bells  Date Rapid Response Notified 11/04/19  Time Rapid Response Notified 1242  Document  Patient Outcome Stabilized after interventions  Progress note created (see row info) Yes

## 2019-11-04 NOTE — Progress Notes (Signed)
Patient called out to nurse's station. Patient unclear on stating needs, sounded muffled and confused. RN in to see patient and determine patient needs. Patient found to be pale with increased WOB, diaphoretic, periorbital swelling noted, patient cool to touch. Contacted primary RN to inquire about assessment findings from this morning to compare to assessment findings now. Nasal Cannula found to be out of patients nose and on cheek with tubing pulled tight. Nasal cannula repositioned back in nares. Vital signs obtained and patient found to be hypoxic at 59%. Oxygen increased to 6 L [place], which increased patient's O2 sat to 97%. Rectal temperature obtained and 94.4. Other vitals stable. Misty Stanley, RN called to bedside to assist. Contacted Dr Jerolyn Center about findings and for further orders. Able to titrate oxygen back down after 20 minutes to 4 liters, with oxygen sats steady at 93%. Jillyn Hidden, primary RN, notified of findings and interventions.

## 2019-11-04 NOTE — Progress Notes (Signed)
Patient is confused and is pulling off mask of BiPAP. Even though her PCO2 is high do not think it is related to Confusion.

## 2019-11-04 NOTE — Consult Note (Signed)
[facility] Consult  Reason for Consult: Acute pancreatitis ? Gallstone pancreatitis  Referring Physician:  Dr. Ashley Royalty   Chief Complaint    Abdominal Pain      HPI: Briana Dean is a 79 y.o. 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 She endorsed radiation to her back and was found to have pancreatitis with a lipase of 884. She also had some associated SOB, nausea, vomiting. She has been in the hospital for a few days now and has continued to have some pain. She has been worked up with a CT, [place], and MRCP.  She was found to have stones but no choledocholithiasis, and has no other etiology of her pancreatitis.    She is currently confused. She is oriented to self, hospital, but is only able to answer some questions.  She is unable to tell me if she lives alone or with someone. She says that Harlynn Kimbell is her daughter in law. She also says she was taken care of better in the past.   I spoke with Britta Mccreedy and she reports that she does live with her since her husband's death.  She has not new changes in medication, does not drink alcohol.  The patient was unable to tell me why she had a laparotomy but her daughter in law reports hernia repair with mesh at [facility] around 2016.   Britta Mccreedy says she has become more confused, should not cook and do activities herself for safety, and will need a SNF in the future.   Past Medical History:  Diagnosis Date  . AAA (abdominal aortic aneurysm) (HCC)   . Acute on chronic respiratory failure with hypoxia (HCC)   . Anemia, unspecified   . Aneurysm of thoracic aorta (HCC)   . Anxiety disorder   . Chronic combined systolic and diastolic heart failure (HCC)   . Constipation   . COPD (chronic obstructive pulmonary disease) (HCC)   . Dissection of distal aorta (HCC)   . Gastroesophageal reflux disease without esophagitis   .  Hyperlipidemia, unspecified   . Hypertension, essential   . Insomnia, unspecified   . Low back pain   . Major depressive disorder, single episode, unspecified   . PAD (peripheral artery disease) (HCC)   . Respiratory failure (HCC)   . Restless leg syndrome   . Unspecified protein-calorie malnutrition (HCC)   . Weakness     Past Surgical History:  Procedure Laterality Date  . HERNIA REPAIR     open hernia with mesh at [facility] 2016  . IR KYPHO EA ADDL LEVEL THORACIC OR LUMBAR  03/25/2018  . IR KYPHO LUMBAR INC FX REDUCE BONE BX UNI/BIL CANNULATION INC/IMAGING  03/25/2018  . TEE WITHOUT CARDIOVERSION N/A 10/26/2018   Procedure: TRANSESOPHAGEAL ECHOCARDIOGRAM (TEE);  Surgeon: Antoine Poche, MD;  Location: [facility] SUITE;  Service: Endoscopy;  Laterality: N/A;  . THORACIC AORTIC ANEURYSM REPAIR  09/06/2016   TEVAR  . TRACHEOSTOMY  08/2016  . VIDEO ASSISTED THORACOSCOPY (VATS)/THOROCOTOMY  08/2016    Family History  Problem Relation Age of Onset  . Congenital heart disease Mother   . Heart disease Mother   . Peripheral vascular disease Father   . Alcoholism Father   . Pectus carinatum Son   . Heart failure Son   . Heart attack Son 61  . COPD Sister     Social History  Tobacco Use  . Smoking status: Former Smoker    Packs/day: 1.00    Years: 60.00    Pack years: 60.00    Types: Cigarettes    Quit date: 08/30/2016    Years since quitting: 3.1  . Smokeless tobacco: Never Used  Substance Use Topics  . Alcohol use: No  . Drug use: No    Medications: I have reviewed the patient's current medications. Current Facility-Administered Medications  Medication Dose Route Frequency Provider Last Rate Last Admin  . acetaminophen (TYLENOL) tablet 650 mg  650 mg Oral Q6H PRN Renda Rolls Z, DO       Or  . acetaminophen (TYLENOL) suppository 650 mg  650 mg Rectal Q6H PRN Renda Rolls Z, DO      . aspirin EC tablet 81 mg  81 mg Oral Q breakfast Alberteen Sam, MD    81 mg at 11/04/19 0740  . carbidopa-levodopa (SINEMET IR) 25-100 MG per tablet immediate release 1 tablet  1 tablet Oral Daily Danford, Earl Lites, MD   1 tablet at 11/04/19 [phone]  . enoxaparin (LOVENOX) injection 40 mg  40 mg Subcutaneous Q24H Renda Rolls Z, DO   40 mg at 11/04/19 1950  . famotidine (PEPCID) tablet 20 mg  20 mg Oral Daily Danford, Earl Lites, MD   20 mg at 11/04/19 9326  . furosemide (LASIX) injection 20 mg  20 mg Intravenous Daily Alwyn Ren, MD      . HYDROmorphone (DILAUDID) injection 1 mg  1 mg Intravenous Q4H PRN Alberteen Sam, MD   1 mg at 11/04/19 0355  . ondansetron (ZOFRAN) tablet 4 mg  4 mg Oral Q6H PRN Renda Rolls Z, DO       Or  . ondansetron [facility]) injection 4 mg  4 mg Intravenous Q6H PRN Renda Rolls Z, DO   4 mg at 11/03/19 0434  . PARoxetine (PAXIL) tablet 20 mg  20 mg Oral Daily Alberteen Sam, MD   20 mg at 11/04/19 7124  . potassium chloride (KLOR-CON) CR tablet 10 mEq  10 mEq Oral Daily Alwyn Ren, MD      . rOPINIRole (REQUIP) tablet 0.5 mg  0.5 mg Oral q morning - 10a Danford, Earl Lites, MD   0.5 mg at 11/04/19 5809  . rOPINIRole (REQUIP) tablet 1 mg  1 mg Oral QHS Danford, Earl Lites, MD   1 mg at 11/03/19 2228    Allergies  Allergen Reactions  . Aspirin Other (See Comments)    Stomach pain  . Penicillins     Has patient had a PCN reaction causing immediate rash, facial/tongue/throat swelling, SOB or lightheadedness with hypotension No Has patient had a PCN reaction causing severe rash involving mucus membranes or skin necrosis: No Has patient had a PCN reaction that required hospitalization: No Has patient had a PCN reaction occurring within the last 10 years: No If all of the above answers are "NO", then may proceed with Cephalosporin use.      ROS:  A comprehensive review of systems was negative except for: Respiratory: positive for SOB Gastrointestinal: positive for abdominal pain,  nausea and vomiting  Blood pressure (!) 125/55, pulse 84, temperature 97.8 F (36.6 C), temperature source Axillary, resp. rate 16, height 5\' 7"  (1.702 m), weight 86.2 kg, SpO2 97 %. Physical Exam Vitals reviewed.  Constitutional:      General: She is not in acute distress.    Appearance: She is well-developed.  HENT:  Head: Normocephalic and atraumatic.  Eyes:     Extraocular Movements: Extraocular movements intact.  Cardiovascular:     Rate and Rhythm: Normal rate.  Pulmonary:     Effort: Pulmonary effort is normal.  Abdominal:     General: There is no distension.     Palpations: Abdomen is soft.     Tenderness: There is abdominal tenderness in the epigastric area.     Comments: Midline laparotomy scar, healed   Musculoskeletal:        General: No swelling.  Skin:    General: Skin is warm and dry.  Neurological:     General: No focal deficit present.     Mental Status: She is oriented to person, place, and time.  Psychiatric:        Mood and Affect: Mood normal.        Behavior: Behavior normal.     Results: Results for orders placed or performed during the hospital encounter of 11/01/19 (from the past 48 hour(s))  Comprehensive metabolic panel     Status: Abnormal   Collection Time: 11/03/19  4:56 AM  Result Value Ref Range   Sodium 142 135 - 145 mmol/L   Potassium 3.7 3.5 - 5.1 mmol/L   Chloride 104 98 - 111 mmol/L   CO2 31 22 - 32 mmol/L   Glucose, Bld 91 70 - 99 mg/dL    Comment: Glucose reference range applies only to samples taken after fasting for at least 8 hours.   BUN 14 8 - 23 mg/dL   Creatinine, Ser 4.690.49 0.44 - 1.00 mg/dL   Calcium 8.6 (L) 8.9 - 10.3 mg/dL   Total Protein 5.9 (L) 6.5 - 8.1 g/dL   Albumin 3.0 (L) 3.5 - 5.0 g/dL   AST 18 15 - 41 U/L   ALT <5 0 - 44 U/L   Alkaline Phosphatase 73 38 - 126 U/L   Total Bilirubin 0.9 0.3 - 1.2 mg/dL   GFR calc non Af Amer >60 >60 mL/min   GFR calc Af Amer >60 >60 mL/min   Anion gap 7 5 - 15     Comment: Performed at [facility], [address] [place], [place] [address]  CBC     Status: Abnormal   Collection Time: 11/03/19  4:56 AM  Result Value Ref Range   WBC 11.9 (H) 4.0 - 10.5 K/uL   RBC 3.59 (L) 3.87 - 5.11 MIL/uL   Hemoglobin 11.2 (L) 12.0 - 15.0 g/dL   HCT 84.136.3 32.436.0 - 40.146.0 %   MCV 101.1 (H) 80.0 - 100.0 fL   MCH 31.2 26.0 - 34.0 pg   MCHC 30.9 30.0 - 36.0 g/dL   RDW 02.715.5 25.311.5 - 66.415.5 %   Platelets 132 (L) 150 - 400 K/uL   nRBC 0.0 0.0 - 0.2 %    Comment: Performed at [facility], [address] [place], [place] [address]  Lipase, blood     Status: None   Collection Time: 11/03/19  3:37 PM  Result Value Ref Range   Lipase 33 11 - 51 U/L    Comment: Performed at [facility], [address] [place], [place] [address]  CBC     Status: Abnormal   Collection Time: 11/04/19  2:21 PM  Result Value Ref Range   WBC 12.7 (H) 4.0 - 10.5 K/uL   RBC 3.89 3.87 - 5.11 MIL/uL   Hemoglobin 12.2 12.0 - 15.0 g/dL   HCT 63.839.3 75.636.0 - 43.346.0 %  MCV 101.0 (H) 80.0 - 100.0 fL   MCH 31.4 26.0 - 34.0 pg   MCHC 31.0 30.0 - 36.0 g/dL   RDW 16.1 (H) 09.6 - 04.5 %   Platelets 147 (L) 150 - 400 K/uL   nRBC 0.0 0.0 - 0.2 %    Comment: Performed at [facility], [address] [place], [place] [address]  Comprehensive metabolic panel     Status: Abnormal   Collection Time: 11/04/19  2:21 PM  Result Value Ref Range   Sodium 144 135 - 145 mmol/L   Potassium 3.7 3.5 - 5.1 mmol/L   Chloride 107 98 - 111 mmol/L   CO2 27 22 - 32 mmol/L   Glucose, Bld 108 (H) 70 - 99 mg/dL    Comment: Glucose reference range applies only to samples taken after fasting for at least 8 hours.   BUN 17 8 - 23 mg/dL   Creatinine, Ser 1.91 0.44 - 1.00 mg/dL   Calcium 8.6 (L) 8.9 - 10.3 mg/dL   Total Protein 6.0 (L) 6.5 - 8.1 g/dL   Albumin 2.9 (L) 3.5 - 5.0 g/dL   AST 19 15 - 41 U/L   ALT 5 0 - 44 U/L   Alkaline Phosphatase 71 38 - 126 U/L   Total Bilirubin 0.6 0.3 - 1.2 mg/dL   GFR calc non Af Amer >60 >60  mL/min   GFR calc Af Amer >60 >60 mL/min   Anion gap 10 5 - 15    Comment: Performed at [facility], [address], [place], [place] [address]  Troponin I (High Sensitivity)     Status: Abnormal   Collection Time: 11/04/19  2:21 PM  Result Value Ref Range   Troponin I (High Sensitivity) 40 (H) <18 ng/L    Comment: (NOTE) Elevated high sensitivity troponin I (hsTnI) values and significant  changes across serial measurements may suggest ACS but many other  chronic and acute conditions are known to elevate hsTnI results.  Refer to the "Links" section for chest pain algorithms and additional  guidance. Performed at [facility], [address], [place], [place] [address]   Procalcitonin - Baseline     Status: None   Collection Time: 11/04/19  2:21 PM  Result Value Ref Range   Procalcitonin <0.10 ng/mL    Comment:        Interpretation: PCT (Procalcitonin) <= 0.5 ng/mL: Systemic infection (sepsis) is not likely. Local bacterial infection is possible. (NOTE)       Sepsis PCT Algorithm           Lower Respiratory Tract                                      Infection PCT Algorithm    ----------------------------     ----------------------------         PCT < 0.25 ng/mL                PCT < 0.10 ng/mL         Strongly encourage             Strongly discourage   discontinuation of antibiotics    initiation of antibiotics    ----------------------------     -----------------------------       PCT 0.25 - 0.50 ng/mL            PCT 0.10 - 0.25 ng/mL  OR       >80% decrease in PCT            Discourage initiation of                                            antibiotics      Encourage discontinuation           of antibiotics    ----------------------------     -----------------------------         PCT >= 0.50 ng/mL              PCT 0.26 - 0.50 ng/mL               AND        <80% decrease in PCT             Encourage initiation of                                              antibiotics       Encourage continuation           of antibiotics    ----------------------------     -----------------------------        PCT >= 0.50 ng/mL                  PCT > 0.50 ng/mL               AND         increase in PCT                  Strongly encourage                                      initiation of antibiotics    Strongly encourage escalation           of antibiotics                                     -----------------------------                                           PCT <= 0.25 ng/mL                                                 OR                                        > 80% decrease in PCT                                     Discontinue / Do not initiate  antibiotics Performed at [facility], [address] [place], [place] [address]    Reviewed CT, [place], and MRCP- stones in the gallbladder, edema around the pancreas, no cyst noted  US Abdomen Complete  Result Date: 11/02/2019 CLINICAL DATA:  Pancreatitis, abdominal pain EXAM: ABDOMEN ULTRASOUND COMPLETE COMPARISON:  CT 11/01/2019 FINDINGS: Gallbladder: Multiple layering stones. No wall thickening or sonographic Murphy's sign. Common bile duct: Diameter: Normal caliber, 4 mm Liver: No focal lesion identified. Within normal limits in parenchymal echogenicity. Portal vein is patent on color Doppler imaging with normal direction of blood flow towards the liver. IVC: No abnormality visualized. Pancreas: Visualized portion unremarkable. Spleen: Size and appearance within normal limits. Right Kidney: Length: 13.0 cm. Multiple small cysts, the largest 1.3 cm. No hydronephrosis. Left Kidney: Length: 11.8 cm. Echogenicity within normal limits. No mass or hydronephrosis visualized. Abdominal aorta: 3.5 cm maximally proximal. Other findings: None. IMPRESSION: Cholelithiasis.  No sonographic evidence of acute cholecystitis. 3.5 cm proximal abdominal aortic aneurysm. Recommend followup  by ultrasound in 2 years. This recommendation follows ACR consensus guidelines: White Paper of the ACR Incidental Findings Committee II on Vascular Findings. J Am Coll Radiol 2013; 10:789-794. Electronically Signed   By: Charlett Nose M.D.   On: 11/02/2019 17:30   MR 3D Recon At Scanner  Result Date: 11/03/2019 CLINICAL DATA:  Upper abdominal pain starting last night with nausea and vomiting. EXAM: MRI ABDOMEN WITHOUT AND WITH CONTRAST (INCLUDING MRCP) TECHNIQUE: Multiplanar multisequence MR imaging of the abdomen was performed both before and after the administration of intravenous contrast. Heavily T2-weighted images of the biliary and pancreatic ducts were obtained, and three-dimensional MRCP images were rendered by post processing. CONTRAST:  15mL GADAVIST GADOBUTROL 1 MMOL/ML IV SOLN COMPARISON:  11/01/2019 CT and 11/02/2019 ultrasound FINDINGS: Despite efforts by the technologist and patient, severe motion artifact is present on today's exam and could not be eliminated. This reduces exam sensitivity and specificity. This is a common result when MRCP is attempted in the inpatient setting where patients are less likely to be able to breath hold and cooperate in controlling motion. Lower chest: Dependent atelectasis in the lower lobes. Mild cardiomegaly. Hepatobiliary: Trace perihepatic ascites. Cholelithiasis is faintly suggested but better shown on prior CT. No biliary dilatation. The extrahepatic biliary tree is difficult to even identify on the motion artifact affected MRCP images, much less assess for pathology. No abnormal restriction of diffusion in the liver. No large abnormal enhancing lesion in the liver. Pancreas: No findings of pancreatic pseudocyst, abscess, or necrosis. Indistinct margins of the pancreas may be from the motion artifact or from pancreatitis. Spleen: Speckled low signal intensity in the spleen corresponding to the splenic calcifications from old granulomatous disease. No  splenomegaly. Adrenals/Urinary Tract: Benign-appearing renal cysts including 2 complex cysts of the left kidney. There is a right adrenal mass which is too blurred by motion artifact to further characterize. No findings of hydronephrosis. Mild bilateral perirenal stranding, nonspecific. Stomach/Bowel: Unremarkable Vascular/Lymphatic: Stable small infrarenal abdominal aortic aneurysm. 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 Other: Mild perihepatic and perisplenic ascites. Mild peripancreatic edema and edema tracking along the retroperitoneum. Musculoskeletal: Thoracic and lumbar compression fractures. Levoconvex thoracolumbar scoliosis with lumbar vertebral augmentations. Elevated left hemidiaphragm. IMPRESSION: 1. Despite efforts by the technologist and patient, markedly severe motion artifact is present on today's exam and could not be eliminated. This reduces exam sensitivity and specificity. 2. No findings of pancreatic pseudocyst, abscess, or  necrosis. There is some indistinctness of the pancreatic parenchyma which may be from the motion artifact or from pancreatitis. 3. Trace perihepatic and perisplenic ascites. Edema tracks along the retroperitoneum and in the peripancreatic region. 4. Cholelithiasis. 5. Stable small infrarenal abdominal aortic aneurysm. 6. Thoracic and lumbar compression fractures. 7. Dependent atelectasis in the lower lobes. 8. Mild cardiomegaly. 9. Nonspecific right adrenal mass. Electronically Signed   By: Gaylyn Rong M.D.   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 Small left pleural effusion. Diffuse interstitial pulmonary opacity and pulmonary vascular prominence. IMPRESSION: Cardiomegaly with small left pleural effusion and diffuse  interstitial pulmonary opacity, likely edema. Electronically Signed   By: Lauralyn Primes M.D.   On: 11/04/2019 14:16   MR ABDOMEN MRCP W WO CONTAST  Result Date: 11/03/2019 CLINICAL DATA:  Upper abdominal pain starting last night with nausea and vomiting. EXAM: MRI ABDOMEN WITHOUT AND WITH CONTRAST (INCLUDING MRCP) TECHNIQUE: Multiplanar multisequence MR imaging of the abdomen was performed both before and after the administration of intravenous contrast. Heavily T2-weighted images of the biliary and pancreatic ducts were obtained, and three-dimensional MRCP images were rendered by post processing. CONTRAST:  9mL GADAVIST GADOBUTROL 1 MMOL/ML IV SOLN COMPARISON:  11/01/2019 CT and 11/02/2019 ultrasound FINDINGS: Despite efforts by the technologist and patient, severe motion artifact is present on today's exam and could not be eliminated. This reduces exam sensitivity and specificity. This is a common result when MRCP is attempted in the inpatient setting where patients are less likely to be able to breath hold and cooperate in controlling motion. Lower chest: Dependent atelectasis in the lower lobes. Mild cardiomegaly. Hepatobiliary: Trace perihepatic ascites. Cholelithiasis is faintly suggested but better shown on prior CT. No biliary dilatation. The extrahepatic biliary tree is difficult to even identify on the motion artifact affected MRCP images, much less assess for pathology. No abnormal restriction of diffusion in the liver. No large abnormal enhancing lesion in the liver. Pancreas: No findings of pancreatic pseudocyst, abscess, or necrosis. Indistinct margins of the pancreas may be from the motion artifact or from pancreatitis. Spleen: Speckled low signal intensity in the spleen corresponding to the splenic calcifications from old granulomatous disease. No splenomegaly. Adrenals/Urinary Tract: Benign-appearing renal cysts including 2 complex cysts of the left kidney. There is a right adrenal mass which is  too blurred by motion artifact to further characterize. No findings of hydronephrosis. Mild bilateral perirenal stranding, nonspecific. Stomach/Bowel: Unremarkable Vascular/Lymphatic: Stable small infrarenal abdominal aortic aneurysm. 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 Other: Mild perihepatic and perisplenic ascites. Mild peripancreatic edema and edema tracking along the retroperitoneum. Musculoskeletal: Thoracic and lumbar compression fractures. Levoconvex thoracolumbar scoliosis with lumbar vertebral augmentations. Elevated left hemidiaphragm. IMPRESSION: 1. Despite efforts by the technologist and patient, markedly severe motion artifact is present on today's exam and could not be eliminated. This reduces exam sensitivity and specificity. 2. No findings of pancreatic pseudocyst, abscess, or necrosis. There is some indistinctness of the pancreatic parenchyma which may be from the motion artifact or from pancreatitis. 3. Trace perihepatic and perisplenic ascites. Edema tracks along the retroperitoneum and in the peripancreatic region. 4. Cholelithiasis. 5. Stable small infrarenal abdominal aortic aneurysm. 6. Thoracic and lumbar compression fractures. 7. Dependent atelectasis in the lower lobes. 8. Mild cardiomegaly. 9. Nonspecific  right adrenal mass. Electronically Signed   By: Gaylyn Rong M.D.   On: 11/03/2019 21:31     Assessment & Plan:  BRISTYN KULESZA is a 79 y.o. female with acute pancreatitis likely from gallstones. Patient is confused, on O2 right now, has multiple medical conditions and comorbidities. I have spoke to Harriette Ohara about the patient, the patient's recent declines, the patient's need for SNF at discharge, and the risk of surgery in someone with the co-morbidies, prior surgery, and risk of cholecystectomy being open due to that surgery.   - Diet as tolerated with pancreatitis, lipase improving, some  pain  - Would see patient as an outpatient with Britta Mccreedy to discuss the option of cholecystectomy as there is a risk of recurrent pancreatitis 20%+ of the time  - No acute need for surgical intervention    All questions were answered to the satisfaction of the patient and family.  Updated Dr. Ashley Royalty.    Lucretia Roers 11/04/2019, 3:28 PM

## 2019-11-05 LAB — BASIC METABOLIC PANEL
Anion gap: 12 (ref 5–15)
BUN: 22 mg/dL (ref 8–23)
CO2: 28 mmol/L (ref 22–32)
Calcium: 8.9 mg/dL (ref 8.9–10.3)
Chloride: 105 mmol/L (ref 98–111)
Creatinine, Ser: 0.71 mg/dL (ref 0.44–1.00)
GFR calc Af Amer: >60 mL/min (ref >60)
GFR calc non Af Amer: >60 mL/min (ref >60)
Glucose, Bld: 92 mg/dL (ref 70–99)
Potassium: 3.1 mmol/L — ABNORMAL LOW (ref 3.5–5.1)
Sodium: 145 mmol/L (ref 135–145)

## 2019-11-05 LAB — CBC
HCT: 35.8 % — ABNORMAL LOW (ref 36.0–46.0)
Hemoglobin: 11.3 g/dL — ABNORMAL LOW (ref 12.0–15.0)
MCH: 31.5 pg (ref 26.0–34.0)
MCHC: 31.6 g/dL (ref 30.0–36.0)
MCV: 99.7 fL (ref 80.0–100.0)
Platelets: 123 10*3/uL — ABNORMAL LOW (ref 150–400)
RBC: 3.59 MIL/uL — ABNORMAL LOW (ref 3.87–5.11)
RDW: 15.6 % — ABNORMAL HIGH (ref 11.5–15.5)
WBC: 7.9 10*3/uL (ref 4.0–10.5)
nRBC: 0 % (ref 0.0–0.2)

## 2019-11-05 LAB — BLOOD GAS, ARTERIAL
Acid-Base Excess: 6.5 mmol/L — ABNORMAL HIGH (ref 0.0–2.0)
Bicarbonate: 28.5 mmol/L — ABNORMAL HIGH (ref 20.0–28.0)
FIO2: 40
O2 Saturation: 69.3 %
Patient temperature: 37.1
pCO2 arterial: 66.9 mmHg (ref 32.0–48.0)
pH, Arterial: 7.308 — ABNORMAL LOW (ref 7.350–7.450)
pO2, Arterial: 37.4 mmHg — CL (ref 83.0–108.0)

## 2019-11-05 LAB — MAGNESIUM: Magnesium: 1.8 mg/dL (ref 1.7–2.4)

## 2019-11-05 LAB — TROPONIN I (HIGH SENSITIVITY): Troponin I (High Sensitivity): 123 ng/L (ref <18)

## 2019-11-05 MED ORDER — FUROSEMIDE 10 MG/ML IJ SOLN
20.0000 mg | Freq: Every day | INTRAMUSCULAR | Status: DC
  Administered 2019-11-06 – 2019-11-07 (×2): 20 mg via INTRAVENOUS
  Filled 2019-11-05 (×2): qty 2

## 2019-11-05 MED ORDER — METHYLPREDNISOLONE SODIUM SUCC 40 MG IJ SOLR
40.0000 mg | Freq: Two times a day (BID) | INTRAMUSCULAR | Status: DC
  Administered 2019-11-05 – 2019-11-07 (×5): 40 mg via INTRAVENOUS
  Filled 2019-11-05 (×5): qty 1

## 2019-11-05 MED ORDER — ATENOLOL 25 MG PO TABS
25.0000 mg | ORAL_TABLET | Freq: Two times a day (BID) | ORAL | Status: DC
  Administered 2019-11-05 – 2019-11-07 (×5): 25 mg via ORAL
  Filled 2019-11-05 (×5): qty 1

## 2019-11-05 MED ORDER — POTASSIUM CHLORIDE CRYS ER 20 MEQ PO TBCR
40.0000 meq | EXTENDED_RELEASE_TABLET | Freq: Once | ORAL | Status: AC
  Administered 2019-11-05: 40 meq via ORAL
  Filled 2019-11-05: qty 2

## 2019-11-05 MED ORDER — IPRATROPIUM-ALBUTEROL 0.5-2.5 (3) MG/3ML IN SOLN
3.0000 mL | Freq: Four times a day (QID) | RESPIRATORY_TRACT | Status: DC
  Administered 2019-11-05 – 2019-11-07 (×9): 3 mL via RESPIRATORY_TRACT
  Filled 2019-11-05: qty 3
  Filled 2019-11-05: qty 6
  Filled 2019-11-05 (×6): qty 3

## 2019-11-05 MED ORDER — LISINOPRIL 10 MG PO TABS
10.0000 mg | ORAL_TABLET | Freq: Every day | ORAL | Status: DC
  Administered 2019-11-05 – 2019-11-07 (×3): 10 mg via ORAL
  Filled 2019-11-05 (×3): qty 1

## 2019-11-05 NOTE — Progress Notes (Signed)
Patient was confused this morning and became very restless. Patient on nasal cannula at the time. Respiratory came and ABG drawn. Patient placed back on bipap and settled down becoming calmer. Dr Jerolyn Center aware. Held AM PO meds due to being on Bipap per verbal from Dr Jerolyn Center. At around 1150, patient became very agitated, trying to get out of bed with Bipap on and pulling on bipap tubing. Patient was not able to be verbally calmed but did state that she was in pain. CPOT done since patient unable to clarify where pain was and scored 6. PRN Dilaudid given with good effect. Patient off Bipap at noon and was able to tolerated liquids. PO meds were then given including the daily meds she wasn't able to take in the AM due to being on the Bipap. Dr Jerolyn Center aware. Lab results of Potassium 3.1 (taken before patient took her daily dose of potassium ) and troponin 123. Dr Jerolyn Center aware.

## 2019-11-05 NOTE — Progress Notes (Signed)
Received critical ABG values.  ABG was venous instead of arterial...CO2 66.9, PaO2 37.4 but venous, PH 7.308. Adjusted rate to 16

## 2019-11-05 NOTE — Progress Notes (Signed)
CRITICAL VALUE ALERT  Critical Value:  Troponin 123  Date & Time Notied:  11/05/19 @ 1410  Provider Notified: Jerolyn Center, MD.  Orders Received/Actions taken: Awaiting orders.

## 2019-11-05 NOTE — Progress Notes (Signed)
[facility] Progress Note     Subjective: Patient on BIPAP overnight, off for brief period this AM and back on now. Does not open eyes to stimulation.   Objective: Vital signs in last 24 hours: Temp:  [94.4 F (34.7 C)-98.8 F (37.1 C)] 98.7 F (37.1 C) (06/06 0801) Pulse Rate:  [64-107] 76 (06/06 0900) Resp:  [12-24] 15 (06/06 0900) BP: (66-164)/(32-97) 115/44 (06/06 0900) SpO2:  [88 %-97 %] 97 % (06/06 0900) Weight:  [86.8 kg-87.6 kg] 87.6 kg (06/06 0500) Last BM Date: 10/31/19  Intake/Output from previous day: 06/05 0701 - 06/06 0700 In: 313 [P.O.:310; I.V.:3] Out: 1250 [Urine:1250] Intake/Output this shift: No intake/output data recorded.  General appearance: on Bipap, eyes closed, no eyes open with stimulation, some motioning with arms  GI: soft, grimace with palpation of the epigastric region  Lab Results:  Recent Labs    11/03/19 0456 11/04/19 1421  WBC 11.9* 12.7*  HGB 11.2* 12.2  HCT 36.3 39.3  PLT 132* 147*   BMET Recent Labs    11/03/19 0456 11/04/19 1421  NA 142 144  K 3.7 3.7  CL 104 107  CO2 31 27  GLUCOSE 91 108*  BUN 14 17  CREATININE 0.49 0.45  CALCIUM 8.6* 8.6*   PT/INR No results for input(s): LABPROT, INR in the last 72 hours.  Studies/Results: MR 3D Recon At Scanner  Result Date: 11/03/2019 CLINICAL DATA:  Upper abdominal pain starting last night with nausea and vomiting. EXAM: MRI ABDOMEN WITHOUT AND WITH CONTRAST (INCLUDING MRCP) TECHNIQUE: Multiplanar multisequence MR imaging of the abdomen was performed both before and after the administration of intravenous contrast. Heavily T2-weighted images of the biliary and pancreatic ducts were obtained, and three-dimensional MRCP images were rendered by post processing. CONTRAST:  80mL GADAVIST GADOBUTROL 1 MMOL/ML IV SOLN COMPARISON:  11/01/2019 CT and 11/02/2019 ultrasound FINDINGS: Despite efforts by the technologist and patient, severe motion artifact is present on  today's exam and could not be eliminated. This reduces exam sensitivity and specificity. This is a common result when MRCP is attempted in the inpatient setting where patients are less likely to be able to breath hold and cooperate in controlling motion. Lower chest: Dependent atelectasis in the lower lobes. Mild cardiomegaly. Hepatobiliary: Trace perihepatic ascites. Cholelithiasis is faintly suggested but better shown on prior CT. No biliary dilatation. The extrahepatic biliary tree is difficult to even identify on the motion artifact affected MRCP images, much less assess for pathology. No abnormal restriction of diffusion in the liver. No large abnormal enhancing lesion in the liver. Pancreas: No findings of pancreatic pseudocyst, abscess, or necrosis. Indistinct margins of the pancreas may be from the motion artifact or from pancreatitis. Spleen: Speckled low signal intensity in the spleen corresponding to the splenic calcifications from old granulomatous disease. No splenomegaly. Adrenals/Urinary Tract: Benign-appearing renal cysts including 2 complex cysts of the left kidney. There is a right adrenal mass which is too blurred by motion artifact to further characterize. No findings of hydronephrosis. Mild bilateral perirenal stranding, nonspecific. Stomach/Bowel: Unremarkable Vascular/Lymphatic: Stable small infrarenal abdominal aortic aneurysm. 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 Other: Mild perihepatic and perisplenic ascites. Mild peripancreatic edema and edema tracking along the retroperitoneum. Musculoskeletal: Thoracic and lumbar compression fractures. Levoconvex thoracolumbar scoliosis with lumbar vertebral augmentations. Elevated left hemidiaphragm. IMPRESSION: 1. Despite efforts by the technologist and patient, markedly severe motion artifact is present on today's exam and could  not be eliminated. This reduces exam  sensitivity and specificity. 2. No findings of pancreatic pseudocyst, abscess, or necrosis. There is some indistinctness of the pancreatic parenchyma which may be from the motion artifact or from pancreatitis. 3. Trace perihepatic and perisplenic ascites. Edema tracks along the retroperitoneum and in the peripancreatic region. 4. Cholelithiasis. 5. Stable small infrarenal abdominal aortic aneurysm. 6. Thoracic and lumbar compression fractures. 7. Dependent atelectasis in the lower lobes. 8. Mild cardiomegaly. 9. Nonspecific right adrenal mass. Electronically Signed   By: Gaylyn Rong M.D.   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 Small left pleural effusion. Diffuse interstitial pulmonary opacity and pulmonary vascular prominence. IMPRESSION: Cardiomegaly with small left pleural effusion and diffuse interstitial pulmonary opacity, likely edema. Electronically Signed   By: Lauralyn Primes M.D.   On: 11/04/2019 14:16   MR ABDOMEN MRCP W WO CONTAST  Result Date: 11/03/2019 CLINICAL DATA:  Upper abdominal pain starting last night with nausea and vomiting. EXAM: MRI ABDOMEN WITHOUT AND WITH CONTRAST (INCLUDING MRCP) TECHNIQUE: Multiplanar multisequence MR imaging of the abdomen was performed both before and after the administration of intravenous contrast. Heavily T2-weighted images of the biliary and pancreatic ducts were obtained, and three-dimensional MRCP images were rendered by post processing. CONTRAST:  25mL GADAVIST GADOBUTROL 1 MMOL/ML IV SOLN COMPARISON:  11/01/2019 CT and 11/02/2019 ultrasound FINDINGS: Despite efforts by the technologist and patient, severe motion artifact is present on today's exam and could not be eliminated. This reduces exam sensitivity and specificity. This is a common result when MRCP is attempted in the  inpatient setting where patients are less likely to be able to breath hold and cooperate in controlling motion. Lower chest: Dependent atelectasis in the lower lobes. Mild cardiomegaly. Hepatobiliary: Trace perihepatic ascites. Cholelithiasis is faintly suggested but better shown on prior CT. No biliary dilatation. The extrahepatic biliary tree is difficult to even identify on the motion artifact affected MRCP images, much less assess for pathology. No abnormal restriction of diffusion in the liver. No large abnormal enhancing lesion in the liver. Pancreas: No findings of pancreatic pseudocyst, abscess, or necrosis. Indistinct margins of the pancreas may be from the motion artifact or from pancreatitis. Spleen: Speckled low signal intensity in the spleen corresponding to the splenic calcifications from old granulomatous disease. No splenomegaly. Adrenals/Urinary Tract: Benign-appearing renal cysts including 2 complex cysts of the left kidney. There is a right adrenal mass which is too blurred by motion artifact to further characterize. No findings of hydronephrosis. Mild bilateral perirenal stranding, nonspecific. Stomach/Bowel: Unremarkable Vascular/Lymphatic: Stable small infrarenal abdominal aortic aneurysm. 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 Other: Mild perihepatic and perisplenic ascites. Mild peripancreatic edema and edema tracking along the retroperitoneum. Musculoskeletal: Thoracic and lumbar compression fractures. Levoconvex thoracolumbar scoliosis with lumbar vertebral augmentations. Elevated left hemidiaphragm. IMPRESSION: 1. Despite efforts by the technologist and patient, markedly severe motion artifact is present on today's exam and could not be eliminated. This reduces exam sensitivity and specificity. 2. No findings of pancreatic pseudocyst, abscess, or necrosis. There is some indistinctness of the pancreatic parenchyma  which may be from the motion artifact or from pancreatitis. 3. Trace perihepatic and perisplenic ascites. Edema tracks along the retroperitoneum and in the peripancreatic region. 4. Cholelithiasis. 5. Stable small infrarenal abdominal aortic aneurysm.  6. Thoracic and lumbar compression fractures. 7. Dependent atelectasis in the lower lobes. 8. Mild cardiomegaly. 9. Nonspecific right adrenal mass. Electronically Signed   By: Van Clines M.D.   On: 11/03/2019 21:31    Assessment/Plan: Ms. Rinck is a 79 yo with acute pancreatitis on CT and lipase elevation likely from gallstones who know has pulmonary edema and hypoxia on Bipap that improved some.  She is minimally responsive with stimulation at this point and will likely tire out.   -Acute pancreatitis, no labs today but lipase had resolved, still with some pain given grimace, unable to eat at this point due to her mental status, if she gets intubated can do tube feeds  -No acute surgical intervention for gallstone pancreatitis, and I had a long discussion with the daughter in law, Pamala Hurry yesterday regarding the risk of the procedure and patient's overall status and co-morbidities, this can be further discussed in an outpatient setting   Updated Dr. Zigmund Daniel.    LOS: 3 days    Virl Cagey 11/05/2019

## 2019-11-05 NOTE — Progress Notes (Addendum)
PROGRESS NOTE    Briana Dean  HKV:[id number] DOB: 17-Nov-1940 DOA: 11/01/2019 PCP: Jason Coop, FNP  Brief Narrative: 79 y.o.F with HTN, COPD, dCHF, hx thoracic and abdominal aortic aneurysm with hx rupture and repair who presented with abdominal pain for 1 day.  Patient had been in [facility] until day of admission, developed sudden onset epigastric pain, became severe, radiated to back.  In the ER, hypertensive, requiring O2, lipase 884 and CT abdomen showed acute pancreatitis.    Assessment & Plan:   Principal Problem:   Acute pancreatitis Active Problems:   Hypoxia   Abdominal pain   Gallstone pancreatitis   #1 acute pancreatitis likely secondary to gallstones.  Check lipid panel.   Lipase on admission was 800 now it is down to normal. Diet advancement as tolerated. MRCP with gallstones discussed with Dr. Henreitta Leber   #2 acute hypercapnic hypoxic  respiratory failure /COPD-patient did not have oxygen prior to admission to hospital.  Patient has been transferred to stepdown unit with change in mental status status secondary to hypercapnic respiratory failure.  She was placed on BiPAP with improvement in her mentation and hypercapnia. Nebulizer treatments Solu-Medrol 40 mg every 12 ABG 11/04/2019-7.1 7/88/62 ABG 11/05/2019-7.3 0/66/37  #3 history of chronic diastolic CHF- patient became hypoxic, stat chest x-ray shows left pleural effusion and pulmonary edema. IV fluids DC'd.  reStarted Lasix.  Patient was on Lasix prior to admission to hospital.  Restart lisinopril and beta-blocker which she was taking at home..  #4 Parkinson's disease/restless leg syndrome on Sinemet and Requip  #5 history of anxiety on Paxil  #6 aortic aneurysm repair stable followed by vascular surgery as an outpatient  #7  Demand ischemia not acute coronary syndrome -patient with mildly elevated troponin 40 and 77 will trend.  EKG with ST-T wave changes and Q waves unchanged since  admission.  Continue aspirin beta-blocker ACE inhibitor. Discussed with Dr. Anne Fu.    Estimated body mass index is 30.25 kg/m as calculated from the following:   Height as of this encounter: 5\' 7"  (1.702 m).   Weight as of this encounter: 87.6 kg.   DVT prophylaxis: Lovenox  Code Status: Full code Family Communication: Discussed with patients DIL who is her caregiver and her healthcare power of attorney.  Disposition Plan:  Status is: Inpatient  Dispo: The patient is from: Home  Anticipated d/c is to: Skilled nursing facility  Anticipated d/c date is: Unknown  Patient currently is not medically stable to d/c. Patient with acute pancreatitis now with pulmonary edema and hypercapnic respiratory failure on BiPAP   Consultants:   Dr. general surgery  Procedures: MRCP 11/03/2019 Antimicrobials: Anti-infectives (From admission, onward)   None      Subjective: Patient seen in stepdown unit with BiPAP on.  She had the BiPAP on all night and was taken out this morning for a brief period of time when she became more confused so BiPAP was placed back again. Has not taken most of her medications due to either confusion or being on BiPAP.  Objective: Vitals:   11/05/19 0800 11/05/19 0801 11/05/19 0900 11/05/19 1100  BP: (!) 140/54  (!) 115/44   Pulse: 78  76   Resp: 18  15   Temp:  98.7 F (37.1 C)  98.2 F (36.8 C)  TempSrc:  Oral  Oral  SpO2: 97%  97%   Weight:      Height:        Intake/Output Summary (Last 24 hours)  at 11/05/2019 1158 Last data filed at 11/05/2019 1100 Gross per 24 hour  Intake 256 ml  Output 1250 ml  Net -994 ml   Filed Weights   11/01/19 1938 11/04/19 1745 11/05/19 0500  Weight: 86.2 kg 86.8 kg 87.6 kg    Examination:  General exam: Appears calm and comfortable  Respiratory system: few scattered wheezes to auscultation. Respiratory effort normal. Cardiovascular system: S1 & S2 heard, RRR. No JVD,  murmurs, rubs, gallops or clicks. No pedal edema. Gastrointestinal system: Abdomen is nondistended, soft and nontender. No organomegaly or masses felt. Normal bowel sounds heard. Central nervous system: Alert and oriented. No focal neurological deficits. Extremities: Symmetric 5 x 5 power. Skin: No rashes, lesions or ulcers Psychiatry: unable to assess    Data Reviewed: I have personally reviewed following labs and imaging studies  CBC: Recent Labs  Lab 11/01/19 2036 11/02/19 0409 11/03/19 0456 11/04/19 1421  WBC 9.0 9.7 11.9* 12.7*  HGB 12.8 12.5 11.2* 12.2  HCT 40.9 41.0 36.3 39.3  MCV 98.1 100.5* 101.1* 101.0*  PLT 180 135* 132* 147*   Basic Metabolic Panel: Recent Labs  Lab 11/01/19 2036 11/02/19 0409 11/03/19 0456 11/04/19 1421  NA 139 138 142 144  K 3.7 4.0 3.7 3.7  CL 99 98 104 107  CO2 30 29 31 27   GLUCOSE 117* 117* 91 108*  BUN 19 17 14 17   CREATININE 0.62 0.62 0.49 0.45  CALCIUM 9.4 8.8* 8.6* 8.6*   GFR: Estimated Creatinine Clearance: 66.9 mL/min (by C-G formula based on SCr of 0.45 mg/dL). Liver Function Tests: Recent Labs  Lab 11/01/19 2036 11/03/19 0456 11/04/19 1421  AST 47* 18 19  ALT 10 <5 5  ALKPHOS 97 73 71  BILITOT 0.8 0.9 0.6  PROT 7.1 5.9* 6.0*  ALBUMIN 3.9 3.0* 2.9*   Recent Labs  Lab 11/01/19 2036 11/03/19 1537  LIPASE 884* 33   No results for input(s): AMMONIA in the last 168 hours. Coagulation Profile: Recent Labs  Lab 11/01/19 2038  INR 1.0   Cardiac Enzymes: No results for input(s): CKTOTAL, CKMB, CKMBINDEX, TROPONINI in the last 168 hours. BNP (last 3 results) No results for input(s): PROBNP in the last 8760 hours. HbA1C: No results for input(s): HGBA1C in the last 72 hours. CBG: Recent Labs  Lab 11/04/19 1646  GLUCAP 99   Lipid Profile: No results for input(s): CHOL, HDL, LDLCALC, TRIG, CHOLHDL, LDLDIRECT in the last 72 hours. Thyroid Function Tests: No results for input(s): TSH, T4TOTAL, FREET4, T3FREE,  THYROIDAB in the last 72 hours. Anemia Panel: No results for input(s): VITAMINB12, FOLATE, FERRITIN, TIBC, IRON, RETICCTPCT in the last 72 hours. Sepsis Labs: Recent Labs  Lab 11/04/19 1421  PROCALCITON <0.10    Recent Results (from the past 240 hour(s))  SARS Coronavirus 2 by RT PCR (hospital order, performed in [facility] lab) Nasopharyngeal Nasopharyngeal Swab     Status: None   Collection Time: 11/01/19 10:38 PM   Specimen: Nasopharyngeal Swab  Result Value Ref Range Status   SARS Coronavirus 2 NEGATIVE NEGATIVE Final    Comment: (NOTE) SARS-CoV-2 target nucleic acids are NOT DETECTED. The SARS-CoV-2 RNA is generally detectable in upper and lower respiratory specimens during the acute phase of infection. The lowest concentration of SARS-CoV-2 viral copies this assay can detect is 250 copies / mL. A negative result does not preclude SARS-CoV-2 infection and should not be used as the sole basis for treatment or other patient management decisions.  A negative result may  occur with improper specimen collection / handling, submission of specimen other than nasopharyngeal swab, presence of viral mutation(s) within the areas targeted by this assay, and inadequate number of viral copies (<250 copies / mL). A negative result must be combined with clinical observations, patient history, and epidemiological information. Fact Sheet for Patients:   BoilerBrush.com.cy Fact Sheet for Healthcare Providers: https://pope.com/ This test is not yet approved or cleared  by the [place] FDA and has been authorized for detection and/or diagnosis of SARS-CoV-2 by FDA under an Emergency Use Authorization (EUA).  This EUA will remain in effect (meaning this test can be used) for the duration of the COVID-19 declaration under Section 564(b)(1) of the Act, 21 U.S.C. section 360bbb-3(b)(1), unless the authorization is terminated or revoked  sooner. Performed at [facility], [address], [place], [place] [address]   MRSA PCR Screening     Status: None   Collection Time: 11/04/19  5:59 PM   Specimen: Nasal Mucosa; Nasopharyngeal  Result Value Ref Range Status   MRSA by PCR NEGATIVE NEGATIVE Final    Comment:        The GeneXpert MRSA Assay (FDA approved for NASAL specimens only), is one component of a comprehensive MRSA colonization surveillance program. It is not intended to diagnose MRSA infection nor to guide or monitor treatment for MRSA infections. Performed at [facility], [address] [place], [place] [address]          Radiology Studies: MR 3D Recon At Scanner  Result Date: 11/03/2019 CLINICAL DATA:  Upper abdominal pain starting last night with nausea and vomiting. EXAM: MRI ABDOMEN WITHOUT AND WITH CONTRAST (INCLUDING MRCP) TECHNIQUE: Multiplanar multisequence MR imaging of the abdomen was performed both before and after the administration of intravenous contrast. Heavily T2-weighted images of the biliary and pancreatic ducts were obtained, and three-dimensional MRCP images were rendered by post processing. CONTRAST:  33mL GADAVIST GADOBUTROL 1 MMOL/ML IV SOLN COMPARISON:  11/01/2019 CT and 11/02/2019 ultrasound FINDINGS: Despite efforts by the technologist and patient, severe motion artifact is present on today's exam and could not be eliminated. This reduces exam sensitivity and specificity. This is a common result when MRCP is attempted in the inpatient setting where patients are less likely to be able to breath hold and cooperate in controlling motion. Lower chest: Dependent atelectasis in the lower lobes. Mild cardiomegaly. Hepatobiliary: Trace perihepatic ascites. Cholelithiasis is faintly suggested but better shown on prior CT. No biliary dilatation. The extrahepatic biliary tree is difficult to even identify on the motion artifact affected MRCP images, much less assess for pathology. No abnormal  restriction of diffusion in the liver. No large abnormal enhancing lesion in the liver. Pancreas: No findings of pancreatic pseudocyst, abscess, or necrosis. Indistinct margins of the pancreas may be from the motion artifact or from pancreatitis. Spleen: Speckled low signal intensity in the spleen corresponding to the splenic calcifications from old granulomatous disease. No splenomegaly. Adrenals/Urinary Tract: Benign-appearing renal cysts including 2 complex cysts of the left kidney. There is a right adrenal mass which is too blurred by motion artifact to further characterize. No findings of hydronephrosis. Mild bilateral perirenal stranding, nonspecific. Stomach/Bowel: Unremarkable Vascular/Lymphatic: Stable small infrarenal abdominal aortic aneurysm. 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 Other: Mild perihepatic and perisplenic ascites. Mild peripancreatic edema and edema tracking along the retroperitoneum. Musculoskeletal: Thoracic and lumbar compression fractures. Levoconvex thoracolumbar scoliosis with lumbar vertebral augmentations. Elevated left hemidiaphragm.  IMPRESSION: 1. Despite efforts by the technologist and patient, markedly severe motion artifact is present on today's exam and could not be eliminated. This reduces exam sensitivity and specificity. 2. No findings of pancreatic pseudocyst, abscess, or necrosis. There is some indistinctness of the pancreatic parenchyma which may be from the motion artifact or from pancreatitis. 3. Trace perihepatic and perisplenic ascites. Edema tracks along the retroperitoneum and in the peripancreatic region. 4. Cholelithiasis. 5. Stable small infrarenal abdominal aortic aneurysm. 6. Thoracic and lumbar compression fractures. 7. Dependent atelectasis in the lower lobes. 8. Mild cardiomegaly. 9. Nonspecific right adrenal mass. Electronically Signed   By: Gaylyn Rong M.D.   On:  11/03/2019 21:31   DG Chest Port 1 View  Result Date: 11/04/2019 CLINICAL DATA:  Hypoxemia EXAM: PORTABLE CHEST 1 VIEW COMPARISON:  10/22/2018 FINDINGS: Cardiomegaly status post aortic stent endograft repair. Small left pleural effusion. Diffuse interstitial pulmonary opacity and pulmonary vascular prominence. IMPRESSION: Cardiomegaly with small left pleural effusion and diffuse interstitial pulmonary opacity, likely edema. Electronically Signed   By: Lauralyn Primes M.D.   On: 11/04/2019 14:16   MR ABDOMEN MRCP W WO CONTAST  Result Date: 11/03/2019 CLINICAL DATA:  Upper abdominal pain starting last night with nausea and vomiting. EXAM: MRI ABDOMEN WITHOUT AND WITH CONTRAST (INCLUDING MRCP) TECHNIQUE: Multiplanar multisequence MR imaging of the abdomen was performed both before and after the administration of intravenous contrast. Heavily T2-weighted images of the biliary and pancreatic ducts were obtained, and three-dimensional MRCP images were rendered by post processing. CONTRAST:  63mL GADAVIST GADOBUTROL 1 MMOL/ML IV SOLN COMPARISON:  11/01/2019 CT and 11/02/2019 ultrasound FINDINGS: Despite efforts by the technologist and patient, severe motion artifact is present on today's exam and could not be eliminated. This reduces exam sensitivity and specificity. This is a common result when MRCP is attempted in the inpatient setting where patients are less likely to be able to breath hold and cooperate in controlling motion. Lower chest: Dependent atelectasis in the lower lobes. Mild cardiomegaly. Hepatobiliary: Trace perihepatic ascites. Cholelithiasis is faintly suggested but better shown on prior CT. No biliary dilatation. The extrahepatic biliary tree is difficult to even identify on the motion artifact affected MRCP images, much less assess for pathology. No abnormal restriction of diffusion in the liver. No large abnormal enhancing lesion in the liver. Pancreas: No findings of pancreatic pseudocyst, abscess,  or necrosis. Indistinct margins of the pancreas may be from the motion artifact or from pancreatitis. Spleen: Speckled low signal intensity in the spleen corresponding to the splenic calcifications from old granulomatous disease. No splenomegaly. Adrenals/Urinary Tract: Benign-appearing renal cysts including 2 complex cysts of the left kidney. There is a right adrenal mass which is too blurred by motion artifact to further characterize. No findings of hydronephrosis. Mild bilateral perirenal stranding, nonspecific. Stomach/Bowel: Unremarkable Vascular/Lymphatic: Stable small infrarenal abdominal aortic aneurysm. 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 Other: Mild perihepatic and perisplenic ascites. Mild peripancreatic edema and edema tracking along the retroperitoneum. Musculoskeletal: Thoracic and lumbar compression fractures. Levoconvex thoracolumbar scoliosis with lumbar vertebral augmentations. Elevated left hemidiaphragm. IMPRESSION: 1. Despite efforts by the technologist and patient, markedly severe motion artifact is present on today's exam and could not be eliminated. This reduces exam sensitivity and specificity. 2. No findings of pancreatic pseudocyst, abscess, or necrosis. There is some indistinctness of the pancreatic parenchyma which may be from the motion artifact or from pancreatitis. 3. Trace perihepatic and perisplenic ascites.  Edema tracks along the retroperitoneum and in the peripancreatic region. 4. Cholelithiasis. 5. Stable small infrarenal abdominal aortic aneurysm. 6. Thoracic and lumbar compression fractures. 7. Dependent atelectasis in the lower lobes. 8. Mild cardiomegaly. 9. Nonspecific right adrenal mass. Electronically Signed   By: Van Clines M.D.   On: 11/03/2019 21:31        Scheduled Meds: . aspirin EC  81 mg Oral Q breakfast  . carbidopa-levodopa  1 tablet Oral Daily  . chlorhexidine  15 mL  Mouth Rinse BID  . Chlorhexidine Gluconate Cloth  6 each Topical Daily  . enoxaparin (LOVENOX) injection  40 mg Subcutaneous Q24H  . famotidine  20 mg Oral Daily  . furosemide  20 mg Intravenous Daily  . ipratropium-albuterol  3 mL Nebulization Q6H  . mouth rinse  15 mL Mouth Rinse q12n4p  . PARoxetine  20 mg Oral Daily  . potassium chloride  10 mEq Oral Daily  . rOPINIRole  0.5 mg Oral q morning - 10a  . rOPINIRole  1 mg Oral QHS  . sodium chloride flush  3 mL Intravenous Q12H   Continuous Infusions:   LOS: 3 days     Georgette Shell, MD 11/05/2019, 11:58 AM

## 2019-11-05 NOTE — Progress Notes (Addendum)
Off Bipap for now on 5 lpm /[place]. Gave drink of water. Patient complained of back and feet being hot. Knew she was in hospital. Still a little disoriented some.

## 2019-11-06 DIAGNOSIS — Z515 Encounter for palliative care: Secondary | ICD-10-CM

## 2019-11-06 DIAGNOSIS — Z7189 Other specified counseling: Secondary | ICD-10-CM

## 2019-11-06 DIAGNOSIS — R5381 Other malaise: Secondary | ICD-10-CM

## 2019-11-06 LAB — COMPREHENSIVE METABOLIC PANEL
ALT: 13 U/L (ref 0–44)
AST: 15 U/L (ref 15–41)
Albumin: 2.8 g/dL — ABNORMAL LOW (ref 3.5–5.0)
Alkaline Phosphatase: 57 U/L (ref 38–126)
Anion gap: 9 (ref 5–15)
BUN: 22 mg/dL (ref 8–23)
CO2: 30 mmol/L (ref 22–32)
Calcium: 8.9 mg/dL (ref 8.9–10.3)
Chloride: 105 mmol/L (ref 98–111)
Creatinine, Ser: 0.63 mg/dL (ref 0.44–1.00)
GFR calc Af Amer: >60 mL/min (ref >60)
GFR calc non Af Amer: >60 mL/min (ref >60)
Glucose, Bld: 126 mg/dL — ABNORMAL HIGH (ref 70–99)
Potassium: 3.8 mmol/L (ref 3.5–5.1)
Sodium: 144 mmol/L (ref 135–145)
Total Bilirubin: 0.6 mg/dL (ref 0.3–1.2)
Total Protein: 5.9 g/dL — ABNORMAL LOW (ref 6.5–8.1)

## 2019-11-06 LAB — LIPID PANEL
Cholesterol: 171 mg/dL (ref 0–200)
HDL: 38 mg/dL — ABNORMAL LOW (ref >40)
LDL Cholesterol: 113 mg/dL — ABNORMAL HIGH (ref 0–99)
Total CHOL/HDL Ratio: 4.5 RATIO
Triglycerides: 102 mg/dL (ref <150)
VLDL: 20 mg/dL (ref 0–40)

## 2019-11-06 LAB — CBC
HCT: 34.6 % — ABNORMAL LOW (ref 36.0–46.0)
Hemoglobin: 10.8 g/dL — ABNORMAL LOW (ref 12.0–15.0)
MCH: 30.9 pg (ref 26.0–34.0)
MCHC: 31.2 g/dL (ref 30.0–36.0)
MCV: 99.1 fL (ref 80.0–100.0)
Platelets: 146 10*3/uL — ABNORMAL LOW (ref 150–400)
RBC: 3.49 MIL/uL — ABNORMAL LOW (ref 3.87–5.11)
RDW: 15.6 % — ABNORMAL HIGH (ref 11.5–15.5)
WBC: 6.2 10*3/uL (ref 4.0–10.5)
nRBC: 0 % (ref 0.0–0.2)

## 2019-11-06 MED ORDER — HALOPERIDOL LACTATE 5 MG/ML IJ SOLN: 1.0000 mg | Freq: Four times a day (QID) | INTRAMUSCULAR | Status: DC | PRN

## 2019-11-06 MED ORDER — LORAZEPAM 2 MG/ML IJ SOLN
1.0000 mg | Freq: Once | INTRAMUSCULAR | Status: AC
  Administered 2019-11-06: 1 mg via INTRAVENOUS
  Filled 2019-11-06: qty 1

## 2019-11-06 MED ORDER — SODIUM CHLORIDE 0.9 % IV BOLUS
500.0000 mL | Freq: Once | INTRAVENOUS | Status: AC
  Administered 2019-11-06: 500 mL via INTRAVENOUS

## 2019-11-06 NOTE — Progress Notes (Signed)
[facility] Progress Note     Subjective: More appropriate today. Awake. No pain she says. Has been doing some clears.   Objective: Vital signs in last 24 hours: Temp:  [97.6 F (36.4 C)-98.4 F (36.9 C)] 98.4 F (36.9 C) (06/07 1224) Pulse Rate:  [63-104] 63 (06/07 1600) Resp:  [11-22] 11 (06/07 1600) BP: (74-169)/(48-103) 112/72 (06/07 1600) SpO2:  [93 %-100 %] 96 % (06/07 1600) FiO2 (%):  [30 %-35 %] 30 % (06/07 0402) Weight:  [83.1 kg] 83.1 kg (06/07 0500) Last BM Date: 10/31/19  Intake/Output from previous day: 06/06 0701 - 06/07 0700 In: 726 [P.O.:220; I.V.:6] Out: 1200 [Urine:1200] Intake/Output this shift: No intake/output data recorded.  General appearance: alert, cooperative and no distress Resp: nasal canal, normal work breathing GI: soft, nontender, nondistended  Lab Results:  Recent Labs    11/05/19 1257 11/06/19 0354  WBC 7.9 6.2  HGB 11.3* 10.8*  HCT 35.8* 34.6*  PLT 123* 146*   BMET Recent Labs    11/05/19 1257 11/06/19 0354  NA 145 144  K 3.1* 3.8  CL 105 105  CO2 28 30  GLUCOSE 92 126*  BUN 22 22  CREATININE 0.71 0.63  CALCIUM 8.9 8.9    Anti-infectives: Anti-infectives (From admission, onward)   None      Assessment/Plan: Briana Dean is a 79 yo with gallstone pancreatitis. Improving. Respiratory symptoms seem to be improving too. -Adv diet as tolerated -Can see as outpatient as patient and family desires to discuss cholecystectomy, high risk in patient given co-morbidities, laparotomy scar, Briana Dean daughter in law aware     LOS: 4 days    Lucretia Roers 11/06/2019

## 2019-11-06 NOTE — Consult Note (Addendum)
Consultation Note Date: 11/06/2019   Patient Name: Briana Dean  DOB: [birth year]  MRN: [id number]  Age / Sex: 79 y.o., female  PCP: Wannetta Sender, FNP Referring Physician: Georgette Shell, MD  Reason for Consultation: Establishing goals of care  HPI/Patient Profile: 79 y.o. female  with past medical history of hypertension, hyperlipidemia, COPD, diastolic heart failure, depression, restless leg syndrome admitted on 11/01/2019 with abdominal pain with acute pancreatitis as well as hypoxia with CHF and COPD exacerbations. More confused and requiring BiPAP now.   Clinical Assessment and Goals of Care: I have reviewed diagnostics, lab results, vital signs, and notes.   I met today at Briana Dean' bedside. She is lethargic but does speak with me but with delayed responses. She is oriented to self but not to place or situation. Slowly tells me that the year is 20 something. We discuss her overall health issues and generalized weakness. She tells me that she knows that she needs more help and knows she will need to go stay somewhere where she can have more help now that Briana Dean is working. She gives me permission to call and speak with Briana Dean.   I spoke with Briana Dean over the phone. She reports that she is Briana Dean' daughter-in-law and HCPOA and she has lived with Briana Dean for many years prior to [place] husband (Briana Dean' son) dying from heart attack in 2018. Briana Dean describes a slow functional decline over many years but especially since death of her son. I explained my above conversation and Briana Dean was relieved as she was very worried that Ms. Stencil' would think that she was trying to get rid of her and is glad to hear she understands that she needs more help.   Briana Dean explains that Ms. Frater is able to get around home slowly with walker. She is able to shower herself but is very tired  afterwards. Large concern is meals for Ms. Speakman who can be forgetful to leave on stove or oven. Unfortunately Briana Dean works long hours and there is concern for Ms. Mancel Bale safety and ability to care for herself during these times which is why they are considering SNF.   We discussed concern with ongoing pain and concern for pain medication on her respiratory status. I asked Briana Dean about Ms. Walthers' wishes and previous discussions. Briana Dean confirms DNR and that Ms. Mcnee would never want her life prolonged artificially. We discussed the importance of medical interventions and using them to get people improved to an acceptable quality of life. Briana Dean feels like she does know Ms. Gong' wishes and if she were to reach a point where she has a poor quality of life that she would not find acceptable.   At this point they are hopeful for some level of improvement and transition to SNF for ongoing care. Briana Dean was appreciative of the conversation. All questions/concerns addressed. Emotional support provided.   Primary Decision Maker HCPOA daughter in law [place]   - DNR confirmed - Hopeful for  improvement to go to SNF  Code Status/Advance Care Planning:  DNR   Symptom Management:   Per attending  Nighttime agitation: Agree with haldol and avoid Ativan. May consider trazodone 25 mg po or even Zyprexa 2.5 mg po.   Pain: Consider hydrocodone instead of IV dilaudid.   Palliative Prophylaxis:   Aspiration, Bowel Regimen, Delirium Protocol, Frequent Pain Assessment and Turn Reposition  Psycho-social/Spiritual:   Desire for further Chaplaincy support:no  Additional Recommendations: Education on Hospice  Prognosis:   Overall prognosis poor due to declining functional status.   Discharge Planning: [place] for rehab with Palliative care service follow-up      Primary Diagnoses: Present on Admission: . Acute pancreatitis .  Abdominal pain . Hypoxia   I have reviewed the medical record, interviewed the patient and family, and examined the patient. The following aspects are pertinent.  Past Medical History:  Diagnosis Date  . AAA (abdominal aortic aneurysm) ([place])   . Acute on chronic respiratory failure with hypoxia ([place])   . Anemia, unspecified   . Aneurysm of thoracic aorta ([place])   . Anxiety disorder   . Chronic combined systolic and diastolic heart failure ([place])   . Constipation   . COPD (chronic obstructive pulmonary disease) ([place])   . Dissection of distal aorta ([place])   . Gastroesophageal reflux disease without esophagitis   . Hyperlipidemia, unspecified   . Hypertension, essential   . Insomnia, unspecified   . Low back pain   . Major depressive disorder, single episode, unspecified   . PAD (peripheral artery disease) ([place])   . Respiratory failure ([place])   . Restless leg syndrome   . Unspecified protein-calorie malnutrition ([place])   . Weakness    Social History   Socioeconomic History  . Marital status: Single    Spouse name: Not on file  . Number of children: Not on file  . Years of education: Not on file  . Highest education level: Not on file  Occupational History  . Not on file  Tobacco Use  . Smoking status: Former Smoker    Packs/day: 1.00    Years: 60.00    Pack years: 60.00    Types: Cigarettes    Quit date: 08/30/2016    Years since quitting: 3.1  . Smokeless tobacco: Never Used  Substance and Sexual Activity  . Alcohol use: No  . Drug use: No  . Sexual activity: Not on file  Other Topics Concern  . Not on file  Social History Narrative  . Not on file   Social Determinants of Health   Financial Resource Strain:   . Difficulty of Paying Living Expenses:   Food Insecurity:   . Worried About Charity fundraiser in the Last Year:   . Arboriculturist in the Last Year:   Transportation Needs:   . Film/video editor (Medical):   Marland Kitchen Lack of Transportation  (Non-Medical):   Physical Activity:   . Days of Exercise per Week:   . Minutes of Exercise per Session:   Stress:   . Feeling of Stress :   Social Connections:   . Frequency of Communication with Friends and Family:   . Frequency of Social Gatherings with Friends and Family:   . Attends Religious Services:   . Active Member of Clubs or Organizations:   . Attends Archivist Meetings:   Marland Kitchen Marital Status:    Family History  Problem Relation Age of Onset  . Congenital heart  disease Mother   . Heart disease Mother   . Peripheral vascular disease Father   . Alcoholism Father   . Pectus carinatum Son   . Heart failure Son   . Heart attack Son 63  . COPD Sister    Scheduled Meds: . aspirin EC  81 mg Oral Q breakfast  . atenolol  25 mg Oral BID  . carbidopa-levodopa  1 tablet Oral Daily  . chlorhexidine  15 mL Mouth Rinse BID  . Chlorhexidine Gluconate Cloth  6 each Topical Daily  . enoxaparin (LOVENOX) injection  40 mg Subcutaneous Q24H  . famotidine  20 mg Oral Daily  . furosemide  20 mg Intravenous Daily  . ipratropium-albuterol  3 mL Nebulization Q6H  . lisinopril  10 mg Oral Daily  . mouth rinse  15 mL Mouth Rinse q12n4p  . methylPREDNISolone (SOLU-MEDROL) injection  40 mg Intravenous Q12H  . PARoxetine  20 mg Oral Daily  . potassium chloride  10 mEq Oral Daily  . rOPINIRole  0.5 mg Oral q morning - 10a  . rOPINIRole  1 mg Oral QHS  . sodium chloride flush  3 mL Intravenous Q12H   Continuous Infusions: PRN Meds:.acetaminophen **OR** acetaminophen, HYDROmorphone (DILAUDID) injection, ondansetron **OR** ondansetron (ZOFRAN) IV Allergies  Allergen Reactions  . Aspirin Other (See Comments)    Stomach pain  . Penicillins     Has patient had a PCN reaction causing immediate rash, facial/tongue/throat swelling, SOB or lightheadedness with hypotension No Has patient had a PCN reaction causing severe rash involving mucus membranes or skin necrosis: No Has patient had  a PCN reaction that required hospitalization: No Has patient had a PCN reaction occurring within the last 10 years: No If all of the above answers are "NO", then may proceed with Cephalosporin use.    Review of Systems  Unable to perform ROS: Dementia    Physical Exam Vitals and nursing note reviewed.  Constitutional:      General: She is not in acute distress.    Appearance: She is ill-appearing.  Cardiovascular:     Rate and Rhythm: Normal rate.  Pulmonary:     Effort: Pulmonary effort is normal. No tachypnea, accessory muscle usage or respiratory distress.  Abdominal:     General: Abdomen is flat.     Palpations: Abdomen is soft.  Neurological:     Mental Status: She is alert. She is disoriented.     Comments: Speech delayed and responses appropriate with underlying confusion     Vital Signs: BP (!) 169/89   Pulse 87   Temp 97.9 F (36.6 C) (Oral)   Resp 18   Ht '5\' 7"'$  (1.702 m)   Wt 83.1 kg   LMP  (LMP Unknown)   SpO2 96%   BMI 28.69 kg/m  Pain Scale: 0-10 POSS *See Group Information*: 1-Acceptable,Awake and alert Pain Score: 7    SpO2: SpO2: 96 % O2 Device:SpO2: 96 % O2 Flow Rate: .O2 Flow Rate (L/min): 4 L/min  IO: Intake/output summary:   Intake/Output Summary (Last 24 hours) at 11/06/2019 0858 Last data filed at 11/06/2019 0700 Gross per 24 hour  Intake 726 ml  Output 1200 ml  Net -474 ml    LBM: Last BM Date: 10/31/19 Baseline Weight: Weight: 86.2 kg Most recent weight: Weight: 83.1 kg     Palliative Assessment/Data:     Time In/Out: 1045-1110, 1415-1440 Time Total: 50 min Greater than 50%  of this time was spent counseling and coordinating care related  to the above assessment and plan.  Signed by: Vinie Sill, NP Palliative Medicine Team Pager # [phone] (M-F 8a-5p) Team Phone # [phone] (Nights/Weekends)

## 2019-11-06 NOTE — Progress Notes (Signed)
Dr. Welton Flakes notified of B/P 74/49 MAP 56, HR 70, RR16 SpO2 97 on BiPAP.

## 2019-11-06 NOTE — TOC Progression Note (Signed)
Transition of Care [facility]) - Progression Note   Patient Details  Name: Briana Dean MRN: [id number] Date of Birth: 01/21/1941  [facility] CM/SW Contact  Ewing Schlein, LCSW Phone Number: 11/06/2019, 1:43 PM  Clinical Narrative: CSW received call from Elease Hashimoto with [facility] to follow up with patient's bed offer. Per Elease Hashimoto, the bed offer can be held through 11/08/19 but due to limited beds it will likely not be available after that date. CSW updated hospitalist. Per MD, patient is not medically stable to discharge yet and palliative has been consulted.  Expected Discharge Plan: Skilled Nursing Facility Barriers to Discharge: English as a second language teacher, Continued Medical Work up  Expected Discharge Plan and Services Expected Discharge Plan: Skilled Nursing Facility In-house Referral: Clinical Social Work Post Acute Care Choice: Skilled Nursing Facility Living arrangements for the past 2 months: Single Family Home  Readmission Risk Interventions No flowsheet data found.

## 2019-11-06 NOTE — Progress Notes (Signed)
PROGRESS NOTE    Briana Dean  QAS:[id number] DOB: 1940-12-18 DOA: 11/01/2019 PCP: Jason Coop, FNP    Brief Narrative: 79 y.o.F with HTN, COPD, dCHF, hx thoracic and abdominal aortic aneurysm with hx rupture and repair who presented with abdominal pain for 1 day.  Patient had been in USOH until day of admission, developed sudden onset epigastric pain, became severe, radiated to back.  In the ER, hypertensive, requiring O2, lipase 884 and CT abdomen showed acute pancreatitis.     Assessment & Plan:   Principal Problem:   Acute pancreatitis Active Problems:   Hypoxia   Abdominal pain   Gallstone pancreatitis   #1 acute pancreatitis -resolved likely secondary to gallstones.    Lipase on admission was 800 now it is down to normal. Diet advancement as tolerated. MRCP with gallstones discussed with Dr. Henreitta Leber  Not a candidate for surgery or planning for surgery at this time.  #2 acute hypercapnic hypoxic  respiratory failure /COPD-patient did not have oxygen prior to admission to hospital.  Patient has been transferred to stepdown unit with change in mental status status secondary to hypercapnic respiratory failure.  She was placed on BiPAP with improvement in her mentation and hypercapnia.  Continue nebulizer treatments and Solu-Medrol.  She is now on 3 L of oxygen, mentation improved. ABG 11/04/2019-7.1 7/88/62 ABG 11/05/2019-7.3 0/66/37  #3 history of chronic diastolic CHF- patient became hypoxic, stat chest x-ray shows left pleural effusion and pulmonary edema. IV fluids DC'd.  reStarted Lasix.  Patient was on Lasix prior to admission to hospital.  Restarted lisinopril and beta-blocker which she was taking at home..  #4 Parkinson's disease/restless leg syndrome on Sinemet and Requip  #5 history of anxiety on Paxil  #6 aortic aneurysm repair stable followed by vascular surgery as an outpatient  #7  Demand ischemia not acute coronary syndrome -patient  with mildly elevated troponin 40 and 77 will trend.  EKG with ST-T wave changes and Q waves unchanged since admission.  Continue aspirin beta-blocker ACE inhibitor. Discussed with Dr. Anne Fu.   Estimated body mass index is 28.69 kg/m as calculated from the following:   Height as of this encounter: 5\' 7"  (1.702 m).   Weight as of this encounter: 83.1 kg.  DVT prophylaxis:Lovenox  Code Status:Full code Family Communication:Discussed with patients DIL who is her caregiver and her healthcare power of attorney.   Disposition Plan:Status is: Inpatient  Dispo: The patient is from:Home Anticipated d/c is nursing facility Anticipated d/c date NL:[id number] Patient currentlyis not medically stable to d/c.Patient with acute pancreatitis now with pulmonary edema and hypercapnic respiratory failure on BiPAP  Consultants:  Dr. RD:EYCXKGY general surgery  Procedures:MRCP 11/03/2019 Antimicrobials: none   Subjective: Patient on 3 L of oxygen she was sleeping when I walked into the room but I was able to wake her up she answered some of the questions appropriately  Staff reports her blood pressure dropped last night after getting Ativan and Dilaudid. Objective: Vitals:   11/06/19 1011 11/06/19 1100 11/06/19 1200 11/06/19 1224  BP: 132/74  (!) 157/69   Pulse: 94 85 (!) 101   Resp:      Temp:    98.4 F (36.9 C)  TempSrc:    Oral  SpO2:  95% 94%   Weight:      Height:        Intake/Output Summary (Last 24 hours) at 11/06/2019 1227 Last data filed at 11/06/2019 1012 Gross per 24 hour  Intake 726 ml  Output 1200 ml  Net -474 ml   Filed Weights   11/04/19 1745 11/05/19 0500 11/06/19 0500  Weight: 86.8 kg 87.6 kg 83.1 kg    Examination:  General exam: Appears calm and comfortable  Respiratory system: Scattered wheezes to auscultation. Respiratory effort normal. Cardiovascular system: S1 & S2 heard, RRR. No JVD, murmurs, rubs,  gallops or clicks. No pedal edema. Gastrointestinal system: Abdomen is nondistended, soft and nontender. No organomegaly or masses felt. Normal bowel sounds heard. Central nervous system: Awake moves all extremities extremities: Trace bilateral edema Skin: No rashes, lesions or ulcers Psychiatry: Unable to assess    Data Reviewed: I have personally reviewed following labs and imaging studies  CBC: Recent Labs  Lab 11/02/19 0409 11/03/19 0456 11/04/19 1421 11/05/19 1257 11/06/19 0354  WBC 9.7 11.9* 12.7* 7.9 6.2  HGB 12.5 11.2* 12.2 11.3* 10.8*  HCT 41.0 36.3 39.3 35.8* 34.6*  MCV 100.5* 101.1* 101.0* 99.7 99.1  PLT 135* 132* 147* 123* 146*   Basic Metabolic Panel: Recent Labs  Lab 11/02/19 0409 11/03/19 0456 11/04/19 1421 11/05/19 1257 11/05/19 1517 11/06/19 0354  NA 138 142 144 145  --  144  K 4.0 3.7 3.7 3.1*  --  3.8  CL 98 104 107 105  --  105  CO2 29 31 27 28   --  30  GLUCOSE 117* 91 108* 92  --  126*  BUN 17 14 17 22   --  22  CREATININE 0.62 0.49 0.45 0.71  --  0.63  CALCIUM 8.8* 8.6* 8.6* 8.9  --  8.9  MG  --   --   --   --  1.8  --    GFR: Estimated Creatinine Clearance: 65.3 mL/min (by C-G formula based on SCr of 0.63 mg/dL). Liver Function Tests: Recent Labs  Lab 11/01/19 2036 11/03/19 0456 11/04/19 1421 11/06/19 0354  AST 47* 18 19 15   ALT 10 5 5 13   ALKPHOS 97 73 71 57  BILITOT 0.8 0.9 0.6 0.6  PROT 7.1 5.9* 6.0* 5.9*  ALBUMIN 3.9 3.0* 2.9* 2.8*   Recent Labs  Lab 11/01/19 2036 11/03/19 1537  LIPASE 884* 33   No results for input(s): AMMONIA in the last 168 hours. Coagulation Profile: Recent Labs  Lab 11/01/19 2038  INR 1.0   Cardiac Enzymes: No results for input(s): CKTOTAL, CKMB, CKMBINDEX, TROPONINI in the last 168 hours. BNP (last 3 results) No results for input(s): PROBNP in the last 8760 hours. HbA1C: No results for input(s): HGBA1C in the last 72 hours. CBG: Recent Labs  Lab 11/04/19 1646  GLUCAP 99   Lipid  Profile: Recent Labs    11/06/19 0354  CHOL 171  HDL 38*  LDLCALC 113*  TRIG 102  CHOLHDL 4.5   Thyroid Function Tests: No results for input(s): TSH, T4TOTAL, FREET4, T3FREE, THYROIDAB in the last 72 hours. Anemia Panel: No results for input(s): VITAMINB12, FOLATE, FERRITIN, TIBC, IRON, RETICCTPCT in the last 72 hours. Sepsis Labs: Recent Labs  Lab 11/04/19 1421  PROCALCITON <0.10    Recent Results (from the past 240 hour(s))  SARS Coronavirus 2 by RT PCR (hospital order, performed in [facility] hospital lab) Nasopharyngeal Nasopharyngeal Swab     Status: None   Collection Time: 11/01/19 10:38 PM   Specimen: Nasopharyngeal Swab  Result Value Ref Range Status   SARS Coronavirus 2 NEGATIVE NEGATIVE Final    Comment: (NOTE) SARS-CoV-2 target nucleic acids are NOT DETECTED. The SARS-CoV-2 RNA is generally detectable in upper and  lower respiratory specimens during the acute phase of infection. The lowest concentration of SARS-CoV-2 viral copies this assay can detect is 250 copies / mL. A negative result does not preclude SARS-CoV-2 infection and should not be used as the sole basis for treatment or other patient management decisions.  A negative result may occur with improper specimen collection / handling, submission of specimen other than nasopharyngeal swab, presence of viral mutation(s) within the areas targeted by this assay, and inadequate number of viral copies (<250 copies / mL). A negative result must be combined with clinical observations, patient history, and epidemiological information. Fact Sheet for Patients:   StrictlyIdeas.no Fact Sheet for Healthcare Providers: BankingDealers.co.za This test is not yet approved or cleared  by the [place] FDA and has been authorized for detection and/or diagnosis of SARS-CoV-2 by FDA under an Emergency Use Authorization (EUA).  This EUA will remain in effect (meaning this  test can be used) for the duration of the COVID-19 declaration under Section 564(b)(1) of the Act, 21 U.S.C. section 360bbb-3(b)(1), unless the authorization is terminated or revoked sooner. Performed at [facility], [address], [place], [place] [address]   MRSA PCR Screening     Status: None   Collection Time: 11/04/19  5:59 PM   Specimen: Nasal Mucosa; Nasopharyngeal  Result Value Ref Range Status   MRSA by PCR NEGATIVE NEGATIVE Final    Comment:        The GeneXpert MRSA Assay (FDA approved for NASAL specimens only), is one component of a comprehensive MRSA colonization surveillance program. It is not intended to diagnose MRSA infection nor to guide or monitor treatment for MRSA infections. Performed at [facility], [address], [place], [place] [address]          Radiology Studies: [facility] Chest [facility] 1 View  Result Date: 11/04/2019 CLINICAL DATA:  Hypoxemia EXAM: PORTABLE CHEST 1 VIEW COMPARISON:  10/22/2018 FINDINGS: Cardiomegaly status post aortic stent endograft repair. Small left pleural effusion. Diffuse interstitial pulmonary opacity and pulmonary vascular prominence. IMPRESSION: Cardiomegaly with small left pleural effusion and diffuse interstitial pulmonary opacity, likely edema. Electronically Signed   By: Eddie Candle M.D.   On: 11/04/2019 14:16        Scheduled Meds: . aspirin EC  81 mg Oral Q breakfast  . atenolol  25 mg Oral BID  . carbidopa-levodopa  1 tablet Oral Daily  . chlorhexidine  15 mL Mouth Rinse BID  . Chlorhexidine Gluconate Cloth  6 each Topical Daily  . enoxaparin (LOVENOX) injection  40 mg Subcutaneous Q24H  . famotidine  20 mg Oral Daily  . furosemide  20 mg Intravenous Daily  . ipratropium-albuterol  3 mL Nebulization Q6H  . lisinopril  10 mg Oral Daily  . mouth rinse  15 mL Mouth Rinse q12n4p  . methylPREDNISolone (SOLU-MEDROL) injection  40 mg Intravenous Q12H  . PARoxetine  20 mg Oral Daily  . potassium chloride  10 mEq Oral  Daily  . rOPINIRole  0.5 mg Oral q morning - 10a  . rOPINIRole  1 mg Oral QHS  . sodium chloride flush  3 mL Intravenous Q12H   Continuous Infusions:   LOS: 4 days        Georgette Shell, MD  11/06/2019, 12:27 PM

## 2019-11-07 MED ORDER — IPRATROPIUM-ALBUTEROL 0.5-2.5 (3) MG/3ML IN SOLN: 3.0000 mL | Freq: Two times a day (BID) | RESPIRATORY_TRACT | Status: DC

## 2019-11-07 MED ORDER — ACETAMINOPHEN 325 MG PO TABS: 650.0000 mg | ORAL_TABLET | Freq: Four times a day (QID) | ORAL | Status: AC | PRN

## 2019-11-07 MED ORDER — ATENOLOL 25 MG PO TABS: 50.0000 mg | ORAL_TABLET | Freq: Two times a day (BID) | ORAL | 1 refills | Status: AC

## 2019-11-07 MED ORDER — METOPROLOL TARTRATE 5 MG/5ML IV SOLN
5.0000 mg | Freq: Once | INTRAVENOUS | Status: AC
  Administered 2019-11-07: 5 mg via INTRAVENOUS
  Filled 2019-11-07: qty 5

## 2019-11-07 MED ORDER — LIDOCAINE 5 % EX PTCH
1.0000 | MEDICATED_PATCH | CUTANEOUS | Status: DC
  Administered 2019-11-07: 1 via TRANSDERMAL
  Filled 2019-11-07 (×2): qty 1

## 2019-11-07 MED ORDER — ATENOLOL 25 MG PO TABS
50.0000 mg | ORAL_TABLET | Freq: Once | ORAL | Status: AC
  Administered 2019-11-07: 50 mg via ORAL
  Filled 2019-11-07: qty 2

## 2019-11-07 MED ORDER — METOPROLOL TARTRATE 5 MG/5ML IV SOLN
5.0000 mg | INTRAVENOUS | Status: AC
  Administered 2019-11-07: 5 mg via INTRAVENOUS
  Filled 2019-11-07: qty 5

## 2019-11-07 MED ORDER — PREDNISONE 10 MG PO TABS
10.0000 mg | ORAL_TABLET | Freq: Every day | ORAL | 0 refills | Status: AC
Start: 2019-11-07 — End: 2019-11-14

## 2019-11-07 MED ORDER — POTASSIUM CHLORIDE ER 10 MEQ PO TBCR: 10.0000 meq | EXTENDED_RELEASE_TABLET | Freq: Every day | ORAL | 0 refills | Status: AC

## 2019-11-07 NOTE — Discharge Summary (Signed)
Physician Discharge Summary  Briana Dean ZOX:[id number] DOB: 04-07-1941 DOA: 11/01/2019  PCP: Jason CoopNicholson, Sterling J IV, FNP  Admit date: 11/01/2019 Discharge date: 11/07/2019  Admitted From:home Disposition:  Nursing home Recommendations for Outpatient Follow-up:  1. Follow up with PCP in 1-2 weeks 2. Please obtain BMP/CBC in one week 3. Palliative care to follow at the facility  Home Health:none Equipment/Devices:none Discharge Condition:stable CODE STATUS:dnr Diet recommendation: cardiac Brief/Interim Summary:79 y.o.F with HTN, COPD, dCHF, hx thoracic and abdominal aortic aneurysm with hx rupture and repair who presented with abdominal pain for 1 day.  Patient had been in USOH until day of admission, developed sudden onset epigastric pain, became severe, radiated to back.  In the ER, hypertensive, requiring O2, lipase 884 and CT abdomen showed acute pancreatitis.    Discharge Diagnoses:  Principal Problem:   Acute pancreatitis Active Problems:   Hypoxia   Abdominal pain   Gallstone pancreatitis   Declining functional status   Goals of care, counseling/discussion   Palliative care by specialist #1 acute pancreatitis-resolved likely secondary to gallstones.  Lipase on admission was 800  down to normal prior to discharge.she was able to tolerate a regular diet . MRCP with gallstones discussed with Dr. Henreitta LeberBridges  Not a candidate for surgery or planning for surgery at this time. Seen by dr bridges   #2acute hypercapnichypoxicrespiratory failure /COPD-patient did not have oxygen prior to admission to hospital. she was treated with bipap solumedrol  and iv lasix. ABG 11/04/2019-7.1 7/88/62 ABG 11/05/2019-7.3 0/66/37  upon discharge she is on 1.5 lit of oxygen. Please note that it is okay to keep her saturations above 88%. Increasing her oxygen places her at high risk for hyper capneic respiratory failure.  #3Acute on  chronic diastolic CHF-patient became hypoxic, chest  x-ray shows left pleural effusion and pulmonary edema. Treated with Lasix. Restarted lisinopril and beta-blocker which she was taking at home..  #4 Parkinson's disease/restless leg syndrome on Sinemet and Requip  #5 history of anxiety on Paxil  #6 aortic aneurysm repair stablefollowed by vascular surgery as an outpatient  #7 Demand ischemia not acute coronary syndrome-patient with mildly elevated troponin 40 and 77 will trend. EKG with ST-T wave changes and Q waves unchanged since admission. Continue aspirin beta-blocker ACE inhibitor.  #8 Goals of care she is DNR.please have palliative care follow at the facility.  Estimated body mass index is 28.83 kg/m as calculated from the following:   Height as of this encounter: 5\' 7"  (1.702 m).   Weight as of this encounter: 83.5 kg.  Discharge Instructions  Discharge Instructions    Diet - low sodium heart healthy   Complete by: As directed    Increase activity slowly   Complete by: As directed      Allergies as of 11/07/2019      Reactions   Aspirin Other (See Comments)   Stomach pain   Penicillins    Has patient had a PCN reaction causing immediate rash, facial/tongue/throat swelling, SOB or lightheadedness with hypotension No Has patient had a PCN reaction causing severe rash involving mucus membranes or skin necrosis: No Has patient had a PCN reaction that required hospitalization: No Has patient had a PCN reaction occurring within the last 10 years: No If all of the above answers are "NO", then may proceed with Cephalosporin use.      Medication List    STOP taking these medications   potassium chloride SA 15 MEQ tablet Commonly known as: KLOR-CON M15   traMADol 50  MG tablet Commonly known as: ULTRAM     TAKE these medications   acetaminophen 325 MG tablet Commonly known as: TYLENOL Take 2 tablets (650 mg total) by mouth every 6 (six) hours as needed for mild pain (or Fever >/= 101).   albuterol (2.5 MG/3ML)  0.083% nebulizer solution Commonly known as: PROVENTIL Take 3 mLs by nebulization every 4 (four) hours as needed for wheezing or shortness of breath.   aspirin EC 81 MG tablet Take 1 tablet (81 mg total) by mouth daily with breakfast.   atenolol 25 MG tablet Commonly known as: TENORMIN Take 1 tablet (25 mg total) by mouth 2 (two) times daily.   atorvastatin 40 MG tablet Commonly known as: LIPITOR Take 40 mg by mouth daily.   carbidopa-levodopa 25-100 MG tablet Commonly known as: SINEMET IR Take 1 tablet by mouth daily.   ergocalciferol 1.25 MG (50000 UT) capsule Commonly known as: VITAMIN D2 Take 1 capsule (50,000 Units total) by mouth every Friday.   famotidine 20 MG tablet Commonly known as: PEPCID Take 20 mg by mouth daily.   ferrous sulfate 324 (65 Fe) MG Tbec Take by mouth daily.   furosemide 20 MG tablet Commonly known as: LASIX Take 20 mg by mouth daily.   lisinopril 20 MG tablet Commonly known as: ZESTRIL   mometasone-formoterol 100-5 MCG/ACT Aero Commonly known as: DULERA Inhale 2 puffs into the lungs 2 (two) times daily.   ondansetron 4 MG tablet Commonly known as: ZOFRAN Take 1 tablet (4 mg total) by mouth every 6 (six) hours as needed for nausea.   PARoxetine 20 MG tablet Commonly known as: PAXIL Take 20 mg by mouth daily.   polyethylene glycol 17 g packet Commonly known as: MIRALAX / GLYCOLAX Take 17 g by mouth daily.   potassium chloride 10 MEQ tablet Commonly known as: KLOR-CON Take 1 tablet (10 mEq total) by mouth daily.   predniSONE 10 MG tablet Commonly known as: DELTASONE Take 1 tablet (10 mg total) by mouth daily for 7 days.   RA MELATONIN/B-6 PO Take 1 tablet by mouth at bedtime.   rOPINIRole 1 MG tablet Commonly known as: REQUIP Take 1 tablet (1 mg total) by mouth at bedtime.   rOPINIRole 0.5 MG tablet Commonly known as: REQUIP Take 1 tablet (0.5 mg total) by mouth every morning.       Contact information for follow-up  providers    Jason Coop, FNP Follow up.   Specialty: Family Medicine Contact information: [facility] [address] [place] [address] [phone]            Contact information for after-discharge care    Destination    [facility] Preferred SNF .   Service: Skilled Nursing Contact information: [address] [place] [address] [phone]                 Allergies  Allergen Reactions  . Aspirin Other (See Comments)    Stomach pain  . Penicillins     Has patient had a PCN reaction causing immediate rash, facial/tongue/throat swelling, SOB or lightheadedness with hypotension No Has patient had a PCN reaction causing severe rash involving mucus membranes or skin necrosis: No Has patient had a PCN reaction that required hospitalization: No Has patient had a PCN reaction occurring within the last 10 years: No If all of the above answers are "NO", then may proceed with  Cephalosporin use.     Consultations:  surgery   Procedures/Studies: CT Angio Chest PE W/Cm &/Or Wo Cm  Result Date: 11/01/2019 CLINICAL DATA:  Back pain for 2 days with shortness of breath EXAM: CT ANGIOGRAPHY CHEST CT ABDOMEN AND PELVIS WITH CONTRAST TECHNIQUE: Multidetector CT imaging of the chest was performed using the standard protocol during bolus administration of intravenous contrast. Multiplanar CT image reconstructions and MIPs were obtained to evaluate the vascular anatomy. Multidetector CT imaging of the abdomen and pelvis was performed using the standard protocol during bolus administration of intravenous contrast. CONTRAST:  15mL OMNIPAQUE IOHEXOL 350 MG/ML SOLN COMPARISON:  08/28/2019 FINDINGS: CTA CHEST FINDINGS Cardiovascular: Atherosclerotic calcifications of the thoracic aorta are noted. Aortic stent graft is noted involving the aortic arch and descending aorta. No findings  to suggest acute dissection are seen. Coronary calcifications are noted. No cardiac enlargement is noted. Pulmonary artery shows a normal branching pattern. No filling defect to suggest pulmonary embolism is seen. Mediastinum/Nodes: Thoracic inlet is within normal limits. No hilar or mediastinal adenopathy is noted. Scattered calcified hilar lymph nodes are seen consistent with prior granulomatous disease. The esophagus as visualized is within normal limits. Lungs/Pleura: Lungs are well aerated bilaterally. Mild emphysematous changes are seen. Bibasilar scarring is noted. Changes of calcified granulomas are seen. Musculoskeletal: Degenerative changes of the thoracic spine are seen. No compression deformity is noted. No acute rib abnormality is noted. Review of the MIP images confirms the above findings. CT ABDOMEN and PELVIS FINDINGS Hepatobiliary: Liver is within normal limits. Scattered gallstones are noted within the gallbladder without complicating factors. Pancreas: Pancreas is well visualized within normal enhancement pattern. Considerable peripancreatic inflammatory changes noted extending superiorly towards the spleen as well as inferiorly along Gerota's fascia and the left pericolic gutter. Similar changes are noted extending along Gerota's fascia on the right as well. Spleen: Multiple calcified granulomas are noted. Adrenals/Urinary Tract: Right adrenal gland is within normal limits. Left adrenal gland demonstrates a focal nodular lesion consistent with adenoma stable from the prior exam. Kidneys demonstrate bilateral cystic change. Nonobstructing left lower pole renal stone is seen. The ureters are within normal limits. The bladder is within normal limits. Stomach/Bowel: Scattered diverticular change of the colon is noted. No evidence of diverticulitis is seen. The appendix is not well visualized. No inflammatory changes to suggest appendicitis are seen. Stomach is unremarkable. No small bowel abnormality  is seen. Vascular/Lymphatic: Atherosclerotic changes are noted in the infrarenal aorta with mild dilatation to 3.2 cm stable in appearance from the prior exam. 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 Reproductive: Status post hysterectomy. No adnexal masses. Other: No abdominal wall hernia or abnormality. No abdominopelvic ascites. Musculoskeletal: Degenerative changes of the lumbar spine are noted. Changes consistent with prior vertebral augmentation at L3 and L4 is seen. Review of the MIP images confirms the above findings. IMPRESSION: CTA of the chest: No evidence of pulmonary emboli. Thoracic aortic stent graft is again seen without complicating factors. Changes of prior granulomatous disease. Emphysematous changes. CT of the abdomen and pelvis: Changes consistent with acute pancreatitis without evidence of pancreatic necrosis. Diverticulosis without diverticulitis. Cholelithiasis. 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 Dilatation of the infrarenal aorta is again noted a 3.2 cm. Recommend followup by ultrasound in 3 years. This recommendation follows ACR consensus guidelines: White Paper of the ACR Incidental Findings Committee II on Vascular Findings. J  Am Coll Radiol 2013; 10:789-794 Electronically Signed   By: Alcide Clever M.D.   On: 11/01/2019 22:33   US Abdomen Complete  Result Date: 11/02/2019 CLINICAL DATA:  Pancreatitis, abdominal pain EXAM: ABDOMEN ULTRASOUND COMPLETE COMPARISON:  CT 11/01/2019 FINDINGS: Gallbladder: Multiple layering stones. No wall thickening or sonographic Murphy's sign. Common bile duct: Diameter: Normal caliber, 4 mm Liver: No focal lesion identified. Within normal limits in parenchymal echogenicity. Portal vein is patent on color Doppler imaging with normal direction of blood flow towards the liver. IVC: No abnormality visualized.  Pancreas: Visualized portion unremarkable. Spleen: Size and appearance within normal limits. Right Kidney: Length: 13.0 cm. Multiple small cysts, the largest 1.3 cm. No hydronephrosis. Left Kidney: Length: 11.8 cm. Echogenicity within normal limits. No mass or hydronephrosis visualized. Abdominal aorta: 3.5 cm maximally proximal. Other findings: None. IMPRESSION: Cholelithiasis.  No sonographic evidence of acute cholecystitis. 3.5 cm proximal abdominal aortic aneurysm. Recommend followup by ultrasound in 2 years. This recommendation follows ACR consensus guidelines: White Paper of the ACR Incidental Findings Committee II on Vascular Findings. J Am Coll Radiol 2013; 10:789-794. Electronically Signed   By: Charlett Nose M.D.   On: 11/02/2019 17:30   CT Abdomen Pelvis W Contrast  Result Date: 11/01/2019 CLINICAL DATA:  Back pain for 2 days with shortness of breath EXAM: CT ANGIOGRAPHY CHEST CT ABDOMEN AND PELVIS WITH CONTRAST TECHNIQUE: Multidetector CT imaging of the chest was performed using the standard protocol during bolus administration of intravenous contrast. Multiplanar CT image reconstructions and MIPs were obtained to evaluate the vascular anatomy. Multidetector CT imaging of the abdomen and pelvis was performed using the standard protocol during bolus administration of intravenous contrast. CONTRAST:  OMNIPAQUE IOHEXOL 350 MG/ML SOLN COMPARISON:  08/28/2019 FINDINGS: CTA CHEST FINDINGS Cardiovascular: Atherosclerotic calcifications of the thoracic aorta are noted. Aortic stent graft is noted involving the aortic arch and descending aorta. No findings to suggest acute dissection are seen. Coronary calcifications are noted. No cardiac enlargement is noted. Pulmonary artery shows a normal branching pattern. No filling defect to suggest pulmonary embolism is seen. Mediastinum/Nodes: Thoracic inlet is within normal limits. No hilar or mediastinal adenopathy is noted. Scattered calcified hilar lymph nodes  are seen consistent with prior granulomatous disease. The esophagus as visualized is within normal limits. Lungs/Pleura: Lungs are well aerated bilaterally. Mild emphysematous changes are seen. Bibasilar scarring is noted. Changes of calcified granulomas are seen. Musculoskeletal: Degenerative changes of the thoracic spine are seen. No compression deformity is noted. No acute rib abnormality is noted. Review of the MIP images confirms the above findings. CT ABDOMEN and PELVIS FINDINGS Hepatobiliary: Liver is within normal limits. Scattered gallstones are noted within the gallbladder without complicating factors. Pancreas: Pancreas is well visualized within normal enhancement pattern. Considerable peripancreatic inflammatory changes noted extending superiorly towards the spleen as well as inferiorly along Gerota's fascia and the left pericolic gutter. Similar changes are noted extending along Gerota's fascia on the right as well. Spleen: Multiple calcified granulomas are noted. Adrenals/Urinary Tract: Right adrenal gland is within normal limits. Left adrenal gland demonstrates a focal nodular lesion consistent with adenoma stable from the prior exam. Kidneys demonstrate bilateral cystic change. Nonobstructing left lower pole renal stone is seen. The ureters are within normal limits. The bladder is within normal limits. Stomach/Bowel: Scattered diverticular change of the colon is noted. No evidence of diverticulitis is seen. The appendix is not well visualized. No inflammatory changes to suggest appendicitis are seen. Stomach is unremarkable. No small bowel abnormality is  seen. Vascular/Lymphatic: Atherosclerotic changes are noted in the infrarenal aorta with mild dilatation to 3.2 cm stable in appearance from the prior exam. 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 Reproductive: Status post hysterectomy. No adnexal  masses. Other: No abdominal wall hernia or abnormality. No abdominopelvic ascites. Musculoskeletal: Degenerative changes of the lumbar spine are noted. Changes consistent with prior vertebral augmentation at L3 and L4 is seen. Review of the MIP images confirms the above findings. IMPRESSION: CTA of the chest: No evidence of pulmonary emboli. Thoracic aortic stent graft is again seen without complicating factors. Changes of prior granulomatous disease. Emphysematous changes. CT of the abdomen and pelvis: Changes consistent with acute pancreatitis without evidence of pancreatic necrosis. Diverticulosis without diverticulitis. Cholelithiasis. 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 Dilatation of the infrarenal aorta is again noted a 3.2 cm. Recommend followup by ultrasound in 3 years. This recommendation follows ACR consensus guidelines: White Paper of the ACR Incidental Findings Committee II on Vascular Findings. [employer] Coll Radiol 2013; 10:789-794 Electronically Signed   By: Alcide Clever M.D.   On: 11/01/2019 22:33   MR 3D Recon At Scanner  Result Date: 11/03/2019 CLINICAL DATA:  Upper abdominal pain starting last night with nausea and vomiting. EXAM: MRI ABDOMEN WITHOUT AND WITH CONTRAST (INCLUDING MRCP) TECHNIQUE: Multiplanar multisequence MR imaging of the abdomen was performed both before and after the administration of intravenous contrast. Heavily T2-weighted images of the biliary and pancreatic ducts were obtained, and three-dimensional MRCP images were rendered by post processing. CONTRAST:  9mL GADAVIST GADOBUTROL 1 MMOL/ML IV SOLN COMPARISON:  11/01/2019 CT and 11/02/2019 ultrasound FINDINGS: Despite efforts by the technologist and patient, severe motion artifact is present on today's exam and could not be eliminated. This reduces exam sensitivity and specificity. This is a common result when MRCP is attempted in the inpatient setting where patients are less likely  to be able to breath hold and cooperate in controlling motion. Lower chest: Dependent atelectasis in the lower lobes. Mild cardiomegaly. Hepatobiliary: Trace perihepatic ascites. Cholelithiasis is faintly suggested but better shown on prior CT. No biliary dilatation. The extrahepatic biliary tree is difficult to even identify on the motion artifact affected MRCP images, much less assess for pathology. No abnormal restriction of diffusion in the liver. No large abnormal enhancing lesion in the liver. Pancreas: No findings of pancreatic pseudocyst, abscess, or necrosis. Indistinct margins of the pancreas may be from the motion artifact or from pancreatitis. Spleen: Speckled low signal intensity in the spleen corresponding to the splenic calcifications from old granulomatous disease. No splenomegaly. Adrenals/Urinary Tract: Benign-appearing renal cysts including 2 complex cysts of the left kidney. There is a right adrenal mass which is too blurred by motion artifact to further characterize. No findings of hydronephrosis. Mild bilateral perirenal stranding, nonspecific. Stomach/Bowel: Unremarkable Vascular/Lymphatic: Stable small infrarenal abdominal aortic aneurysm. 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 Other: Mild perihepatic and perisplenic ascites. Mild peripancreatic edema and edema tracking along the retroperitoneum. Musculoskeletal: Thoracic and lumbar compression fractures. Levoconvex thoracolumbar scoliosis with lumbar vertebral augmentations. Elevated left hemidiaphragm. IMPRESSION: 1. Despite efforts by the technologist and patient, markedly severe motion artifact is present on today's exam and could not be eliminated. This reduces exam sensitivity and specificity. 2. No findings of pancreatic pseudocyst, abscess, or necrosis. There is some indistinctness  of the pancreatic parenchyma which may be from the motion artifact or from  pancreatitis. 3. Trace perihepatic and perisplenic ascites. Edema tracks along the retroperitoneum and in the peripancreatic region. 4. Cholelithiasis. 5. Stable small infrarenal abdominal aortic aneurysm. 6. Thoracic and lumbar compression fractures. 7. Dependent atelectasis in the lower lobes. 8. Mild cardiomegaly. 9. Nonspecific right adrenal mass. Electronically Signed   By: Gaylyn Rong M.D.   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 Small left pleural effusion. Diffuse interstitial pulmonary opacity and pulmonary vascular prominence. IMPRESSION: Cardiomegaly with small left pleural effusion and diffuse interstitial pulmonary opacity, likely edema. Electronically Signed   By: Lauralyn Primes M.D.   On: 11/04/2019 14:16   MR ABDOMEN MRCP W WO CONTAST  Result Date: 11/03/2019 CLINICAL DATA:  Upper abdominal pain starting last night with nausea and vomiting. EXAM: MRI ABDOMEN WITHOUT AND WITH CONTRAST (INCLUDING MRCP) TECHNIQUE: Multiplanar multisequence MR imaging of the abdomen was performed both before and after the administration of intravenous contrast. Heavily T2-weighted images of the biliary and pancreatic ducts were obtained, and three-dimensional MRCP images were rendered by post processing. CONTRAST:  9mL GADAVIST GADOBUTROL 1 MMOL/ML IV SOLN COMPARISON:  11/01/2019 CT and 11/02/2019 ultrasound FINDINGS: Despite efforts by the technologist and patient, severe motion artifact is present on today's exam and could not be eliminated. This reduces exam sensitivity and specificity. This is a common result when MRCP is attempted in the inpatient setting where patients are less likely to be able to breath hold and cooperate in controlling motion. Lower chest: Dependent atelectasis in the lower lobes. Mild cardiomegaly. Hepatobiliary:  Trace perihepatic ascites. Cholelithiasis is faintly suggested but better shown on prior CT. No biliary dilatation. The extrahepatic biliary tree is difficult to even identify on the motion artifact affected MRCP images, much less assess for pathology. No abnormal restriction of diffusion in the liver. No large abnormal enhancing lesion in the liver. Pancreas: No findings of pancreatic pseudocyst, abscess, or necrosis. Indistinct margins of the pancreas may be from the motion artifact or from pancreatitis. Spleen: Speckled low signal intensity in the spleen corresponding to the splenic calcifications from old granulomatous disease. No splenomegaly. Adrenals/Urinary Tract: Benign-appearing renal cysts including 2 complex cysts of the left kidney. There is a right adrenal mass which is too blurred by motion artifact to further characterize. No findings of hydronephrosis. Mild bilateral perirenal stranding, nonspecific. Stomach/Bowel: Unremarkable Vascular/Lymphatic: Stable small infrarenal abdominal aortic aneurysm. 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 Other: Mild perihepatic and perisplenic ascites. Mild peripancreatic edema and edema tracking along the retroperitoneum. Musculoskeletal: Thoracic and lumbar compression fractures. Levoconvex thoracolumbar scoliosis with lumbar vertebral augmentations. Elevated left hemidiaphragm. IMPRESSION: 1. Despite efforts by the technologist and patient, markedly severe motion artifact is present on today's exam and could not be eliminated. This reduces exam sensitivity and specificity. 2. No findings of pancreatic pseudocyst, abscess, or necrosis. There is some indistinctness of the pancreatic parenchyma which may be from the motion artifact or from pancreatitis. 3. Trace perihepatic and perisplenic ascites. Edema tracks along the retroperitoneum and in the peripancreatic region. 4. Cholelithiasis. 5.  Stable small infrarenal abdominal aortic aneurysm. 6. Thoracic and lumbar compression fractures. 7. Dependent atelectasis in the lower lobes. 8. Mild cardiomegaly. 9. Nonspecific right adrenal mass. Electronically  Signed   By: Gaylyn Rong M.D.   On: 11/03/2019 21:31    (Echo, Carotid, EGD, Colonoscopy, ERCP)    Subjective: Awake alert in nad eating no new complaints  Discharge Exam: Vitals:   11/07/19 0630 11/07/19 0807  BP: (!) 157/113   Pulse: 84   Resp: 17   Temp:  97.6 F (36.4 C)  SpO2: 97%    Vitals:   11/07/19 0530 11/07/19 0600 11/07/19 0630 11/07/19 0807  BP: 137/84 135/65 (!) 157/113   Pulse: (!) 53 (!) 51 84   Resp: Temp:    97.6 F (36.4 C)  TempSrc:    Oral  SpO2: 97% 98% 97%   Weight:      Height:        General: Pt is alert, awake, not in acute distress Cardiovascular: RRR, S1/S2 +, no rubs, no gallops Respiratory: CTA bilaterally, no wheezing, no rhonchi Abdominal: Soft, NT, ND, bowel sounds + Extremities: no edema, no cyanosis    The results of significant diagnostics from this hospitalization (including imaging, microbiology, ancillary and laboratory) are listed below for reference.     Microbiology: Recent Results (from the past 240 hour(s))  SARS Coronavirus 2 by RT PCR (hospital order, performed in [facility] hospital lab) Nasopharyngeal Nasopharyngeal Swab     Status: None   Collection Time: 11/01/19 10:38 PM   Specimen: Nasopharyngeal Swab  Result Value Ref Range Status   SARS Coronavirus 2 NEGATIVE NEGATIVE Final    Comment: (NOTE) SARS-CoV-2 target nucleic acids are NOT DETECTED. The SARS-CoV-2 RNA is generally detectable in upper and lower respiratory specimens during the acute phase of infection. The lowest concentration of SARS-CoV-2 viral copies this assay can detect is 250 copies / mL. A negative result does not preclude SARS-CoV-2 infection and should not be used as the sole basis for treatment or other patient  management decisions.  A negative result may occur with improper specimen collection / handling, submission of specimen other than nasopharyngeal swab, presence of viral mutation(s) within the areas targeted by this assay, and inadequate number of viral copies (<250 copies / mL). A negative result must be combined with clinical observations, patient history, and epidemiological information. Fact Sheet for Patients:   BoilerBrush.com.cy Fact Sheet for Healthcare Providers: https://pope.com/ This test is not yet approved or cleared  by the [place] FDA and has been authorized for detection and/or diagnosis of SARS-CoV-2 by FDA under an Emergency Use Authorization (EUA).  This EUA will remain in effect (meaning this test can be used) for the duration of the COVID-19 declaration under Section 564(b)(1) of the Act, 21 U.S.C. section 360bbb-3(b)(1), unless the authorization is terminated or revoked sooner. Performed at [facility], [address], [place], [place] [address]   MRSA PCR Screening     Status: None   Collection Time: 11/04/19  5:59 PM   Specimen: Nasal Mucosa; Nasopharyngeal  Result Value Ref Range Status   MRSA by PCR NEGATIVE NEGATIVE Final    Comment:        The GeneXpert MRSA Assay (FDA approved for NASAL specimens only), is one component of a comprehensive MRSA colonization surveillance program. It is not intended to diagnose MRSA infection nor to guide or monitor treatment for MRSA infections. Performed at [facility], [address] [place], [place] [address]      Labs: BNP (last 3 results) No results for input(s): BNP in the last 8760 hours. Basic Metabolic Panel: Recent Labs  Lab 11/02/19 0409 11/03/19 0456 11/04/19 1421 11/05/19 1257 11/05/19 1517 11/06/19 0354  NA 138 142 144 145  --  144  K 4.0 3.7 3.7 3.1*  --  3.8  CL 98 104 107 105  --  105  CO2 --  30  GLUCOSE 117* 91 108* 92   --  126*  BUN --  22  CREATININE 0.62 0.49 0.45 0.71  --  0.63  CALCIUM 8.8* 8.6* 8.6* 8.9  --  8.9  MG  --   --   --   --  1.8  --    Liver Function Tests: Recent Labs  Lab 11/01/19 2036 11/03/19 0456 11/04/19 1421 11/06/19 0354  AST 47* ALT 10 5 5 13   ALKPHOS 97 73 71 57  BILITOT 0.8 0.9 0.6 0.6  PROT 7.1 5.9* 6.0* 5.9*  ALBUMIN 3.9 3.0* 2.9* 2.8*   Recent Labs  Lab 11/01/19 2036 11/03/19 1537  LIPASE 884* 33   No results for input(s): AMMONIA in the last 168 hours. CBC: Recent Labs  Lab 11/02/19 0409 11/03/19 0456 11/04/19 1421 11/05/19 1257 11/06/19 0354  WBC 9.7 11.9* 12.7* 7.9 6.2  HGB 12.5 11.2* 12.2 11.3* 10.8*  HCT 41.0 36.3 39.3 35.8* 34.6*  MCV 100.5* 101.1* 101.0* 99.7 99.1  PLT 135* 132* 147* 123* 146*   Cardiac Enzymes: No results for input(s): CKTOTAL, CKMB, CKMBINDEX, TROPONINI in the last 168 hours. BNP: Invalid input(s): POCBNP CBG: Recent Labs  Lab 11/04/19 1646  GLUCAP 99   D-Dimer No results for input(s): DDIMER in the last 72 hours. Hgb A1c No results for input(s): HGBA1C in the last 72 hours. Lipid Profile Recent Labs    11/06/19 0354  CHOL 171  HDL 38*  LDLCALC 113*  TRIG 102  CHOLHDL 4.5   Thyroid function studies No results for input(s): TSH, T4TOTAL, T3FREE, THYROIDAB in the last 72 hours.  Invalid input(s): FREET3 Anemia work up No results for input(s): VITAMINB12, FOLATE, FERRITIN, TIBC, IRON, RETICCTPCT in the last 72 hours. Urinalysis    Component Value Date/Time   COLORURINE YELLOW 11/01/2019 2027   APPEARANCEUR CLEAR 11/01/2019 2027   LABSPEC 1.028 11/01/2019 2027   PHURINE 6.0 11/01/2019 2027   GLUCOSEU NEGATIVE 11/01/2019 2027   HGBUR NEGATIVE 11/01/2019 2027   BILIRUBINUR NEGATIVE 11/01/2019 2027   KETONESUR NEGATIVE 11/01/2019 2027   PROTEINUR 30 (A) 11/01/2019 2027   NITRITE NEGATIVE 11/01/2019 2027   LEUKOCYTESUR NEGATIVE 11/01/2019 2027   Sepsis Labs Invalid  input(s): PROCALCITONIN,  WBC,  LACTICIDVEN Microbiology Recent Results (from the past 240 hour(s))  SARS Coronavirus 2 by RT PCR (hospital order, performed in [facility] hospital lab) Nasopharyngeal Nasopharyngeal Swab     Status: None   Collection Time: 11/01/19 10:38 PM   Specimen: Nasopharyngeal Swab  Result Value Ref Range Status   SARS Coronavirus 2 NEGATIVE NEGATIVE Final    Comment: (NOTE) SARS-CoV-2 target nucleic acids are NOT DETECTED. The SARS-CoV-2 RNA is generally detectable in upper and lower respiratory specimens during the acute phase of infection. The lowest concentration of SARS-CoV-2 viral copies this assay can detect is 250 copies / mL. A negative result does not preclude SARS-CoV-2 infection and should not be used as the sole basis for treatment or other patient management decisions.  A negative result may occur with improper specimen collection / handling, submission of specimen other than nasopharyngeal swab, presence of viral mutation(s) within the  areas targeted by this assay, and inadequate number of viral copies (<250 copies / mL). A negative result must be combined with clinical observations, patient history, and epidemiological information. Fact Sheet for Patients:   BoilerBrush.com.cy Fact Sheet for Healthcare Providers: https://pope.com/ This test is not yet approved or cleared  by the [place] FDA and has been authorized for detection and/or diagnosis of SARS-CoV-2 by FDA under an Emergency Use Authorization (EUA).  This EUA will remain in effect (meaning this test can be used) for the duration of the COVID-19 declaration under Section 564(b)(1) of the Act, 21 U.S.C. section 360bbb-3(b)(1), unless the authorization is terminated or revoked sooner. Performed at [facility], [address] [place], [place] [address]   MRSA PCR Screening     Status: None   Collection Time: 11/04/19  5:59 PM    Specimen: Nasal Mucosa; Nasopharyngeal  Result Value Ref Range Status   MRSA by PCR NEGATIVE NEGATIVE Final    Comment:        The GeneXpert MRSA Assay (FDA approved for NASAL specimens only), is one component of a comprehensive MRSA colonization surveillance program. It is not intended to diagnose MRSA infection nor to guide or monitor treatment for MRSA infections. Performed at [facility], [address] [place], [place] [address]      Time coordinating discharge:  39 minutes  SIGNED:   Alwyn Ren, MD  [facility] 11/07/2019, 10:28 AM Pager   If 7PM-7AM, please contact night-coverage [email or web address] Password [email or web address]

## 2019-11-07 NOTE — Progress Notes (Addendum)
Verbal order received from Dr. Jerolyn Center to give 5mg  Lopressor IV now prior to discharge. Given to patient. Pt discharged and transported via EMS to [facility] in stable condition. Yellow DNR form sent with patient.

## 2019-11-07 NOTE — Progress Notes (Signed)
Atenolol and Hydralazine ordered and given at 1208 for increased BP. At 1330, BP 164/107, HR 70. Informed MD.

## 2019-11-07 NOTE — Care Management Important Message (Signed)
Important Message  Patient Details  Name: Briana Dean MRN: [id number] Date of Birth: 1940-06-17   Medicare Important Message Given:  Yes     Corey Harold 11/07/2019, 11:01 AM

## 2019-11-07 NOTE — Progress Notes (Signed)
Palliative:  HPI: 79 y.o. female  with past medical history of hypertension, hyperlipidemia, COPD, diastolic heart failure, depression, restless leg syndrome admitted on 11/01/2019 with abdominal pain with acute pancreatitis as well as hypoxia with CHF and COPD exacerbations. More confused and requiring BiPAP now.   I met today with Briana Dean and she is much more alert and interactive. She understands and is open to going to rehab but does not want to stay to live in facility. She shares with me that she has a niece in [place]? that plans to take her home with her after rehab. She understands that [facility] cannot care for here because she has to work. She is hopeful for improvement in rehab and transition from there to her niece's home. [employer]CP[employer] has previously reported need for long term care placement. She has no further concerns.   All questions/concerns addressed. Emotional support provided.   Exam: Much more alert, more oriented today, baseline confusion with dementia. No distress. Breathing regular, unlabored. Abd soft. Generalized weakness.   Plan: - SNF rehab with outpatient palliative care.   [place], NP Palliative Medicine Team Pager [phone] (Please see [email or web address] for schedule) Team Phone [phone]    Greater than 50%  of this time was spent counseling and coordinating care related to the above assessment and plan

## 2019-11-07 NOTE — Progress Notes (Addendum)
PT Cancellation Note  Patient Details Name: DELICIA BERENS MRN: [id number] DOB: 06-07-1940   Cancelled Treatment:    Reason Eval/Treat Not Completed: Medical issues which prohibited therapy. BP 173/98. PT spoke with nursing. Nursing awaiting orders for medicine to reduce BP. Nursing reported awaiting EMS any time for transfer to SNF.   Katina Dung. Hartnett-Rands, MS, PT [place] [facility] #[id number] 11/07/2019, 12:11 PM

## 2019-11-07 NOTE — Progress Notes (Signed)
BP 173/106, rechecked 165/98, HR 71. Notified MD.

## 2019-11-07 NOTE — TOC Transition Note (Addendum)
Transition of Care [facility]) - CM/SW Discharge Note  Patient Details  Name: Briana Dean MRN: [id number] Date of Birth: [birth year]  Transition of Care [facility]) CM/SW Contact:  Briana Schlein, LCSW Phone Number: 11/07/2019, 12:05 PM  Clinical Narrative: CSW spoke with Elease Hashimoto at [place]. Per Elease Hashimoto, patient will be in [facility] and the number to call for report is [phone]. CSW faxed FL2, PT evaluation, discharge summary, and discharge orders to [facility]. CSW called [facility] to schedule transportation. CSW completed Medical Necessity Transportation form and provided copy to patient's RN, Judeth Cornfield.  CSW spoke with patient's relative, Briana Dean. CSW updated Briana Dean that patient is discharging to [facility] for short-term rehab. Briana Dean reported the family will look into LTC at another facility after the patient is discharged from rehab. CSW discussed hospitialist's recommendations for outpatient palliative care. Briana Dean agreeable to referral to [facility]. CSW called [facility] and spoke with Angelique Blonder to make outpatient palliative referral. [facility] to follow up with Briana Dean. TOC signing off.  Final next level of care: Skilled Nursing Facility Barriers to Discharge: Barriers Resolved  Patient Goals and CMS Choice Patient states their goals for this hospitalization and ongoing recovery are:: Discharge to SNF and outpatient palliative care CMS Medicare.gov Compare Post Acute Care list provided to:: Patient Represenative (must comment)(Briana Dean (daughter-in-law)) Choice offered to / list presented to : Adult Children  Discharge Placement Existing PASRR number confirmed : 11/03/19          Patient chooses bed at: [facility] Patient to be transferred to facility by: [facility] Name of family member notified: Briana Dean Patient and family notified of of transfer: 11/07/19  Discharge Plan and Services In-house Referral:  Clinical Social Work Post Acute Care Choice: Skilled Nursing Facility          DME Arranged: N/A DME Agency: NA HH Arranged: NA HH Agency: NA  Readmission Risk Interventions No flowsheet data found.
